# Patient Record
Sex: Female | Born: 1970
Health system: Southern US, Community
[De-identification: ages and names within clinical notes are randomized; demographics above are authoritative.]

## PROBLEM LIST (undated history)

## (undated) DIAGNOSIS — M1990 Unspecified osteoarthritis, unspecified site: Secondary | ICD-10-CM

## (undated) DIAGNOSIS — E119 Type 2 diabetes mellitus without complications: Secondary | ICD-10-CM

## (undated) DIAGNOSIS — I10 Essential (primary) hypertension: Secondary | ICD-10-CM

## (undated) DIAGNOSIS — E785 Hyperlipidemia, unspecified: Secondary | ICD-10-CM

## (undated) HISTORY — PX: TUBAL LIGATION: SHX77

## (undated) HISTORY — DX: Type 2 diabetes mellitus without complications: E11.9

## (undated) HISTORY — DX: Essential (primary) hypertension: I10

---

## 2001-07-02 ENCOUNTER — Emergency Department (HOSPITAL_COMMUNITY): Admission: EM | Admit: 2001-07-02 | Discharge: 2001-07-03 | Payer: Self-pay | Admitting: Emergency Medicine

## 2001-12-14 ENCOUNTER — Other Ambulatory Visit: Admission: RE | Admit: 2001-12-14 | Discharge: 2001-12-14 | Payer: Self-pay | Admitting: Obstetrics and Gynecology

## 2001-12-24 ENCOUNTER — Inpatient Hospital Stay (HOSPITAL_COMMUNITY): Admission: AD | Admit: 2001-12-24 | Discharge: 2001-12-27 | Payer: Self-pay | Admitting: Obstetrics & Gynecology

## 2002-05-15 ENCOUNTER — Ambulatory Visit (HOSPITAL_COMMUNITY): Admission: AD | Admit: 2002-05-15 | Discharge: 2002-05-15 | Payer: Self-pay | Admitting: Obstetrics and Gynecology

## 2002-05-29 ENCOUNTER — Ambulatory Visit (HOSPITAL_COMMUNITY): Admission: RE | Admit: 2002-05-29 | Discharge: 2002-05-29 | Payer: Self-pay | Admitting: Obstetrics and Gynecology

## 2002-06-03 ENCOUNTER — Ambulatory Visit (HOSPITAL_COMMUNITY): Admission: AD | Admit: 2002-06-03 | Discharge: 2002-06-03 | Payer: Self-pay | Admitting: Obstetrics and Gynecology

## 2002-06-10 ENCOUNTER — Ambulatory Visit (HOSPITAL_COMMUNITY): Admission: AD | Admit: 2002-06-10 | Discharge: 2002-06-10 | Payer: Self-pay | Admitting: Obstetrics and Gynecology

## 2002-06-18 ENCOUNTER — Ambulatory Visit (HOSPITAL_COMMUNITY): Admission: AD | Admit: 2002-06-18 | Discharge: 2002-06-18 | Payer: Self-pay | Admitting: Obstetrics and Gynecology

## 2002-06-24 ENCOUNTER — Inpatient Hospital Stay (HOSPITAL_COMMUNITY): Admission: AD | Admit: 2002-06-24 | Discharge: 2002-06-27 | Payer: Self-pay | Admitting: Obstetrics and Gynecology

## 2005-06-13 ENCOUNTER — Ambulatory Visit: Payer: Self-pay | Admitting: Family Medicine

## 2006-07-25 ENCOUNTER — Ambulatory Visit: Payer: Self-pay | Admitting: Family Medicine

## 2007-09-07 ENCOUNTER — Encounter: Payer: Self-pay | Admitting: Family Medicine

## 2007-09-07 ENCOUNTER — Ambulatory Visit: Payer: Self-pay | Admitting: Family Medicine

## 2007-09-10 ENCOUNTER — Other Ambulatory Visit: Admission: RE | Admit: 2007-09-10 | Discharge: 2007-09-10 | Payer: Self-pay | Admitting: Family Medicine

## 2007-09-11 ENCOUNTER — Encounter: Payer: Self-pay | Admitting: Family Medicine

## 2007-09-11 LAB — CONVERTED CEMR LAB
Chlamydia, DNA Probe: NEGATIVE
Gardnerella vaginalis: POSITIVE — AB
Microalb, Ur: 0.6 mg/dL (ref 0.00–1.89)
Trichomonal Vaginitis: NEGATIVE

## 2008-05-29 DIAGNOSIS — I10 Essential (primary) hypertension: Secondary | ICD-10-CM

## 2008-05-29 DIAGNOSIS — E1159 Type 2 diabetes mellitus with other circulatory complications: Secondary | ICD-10-CM

## 2008-05-29 DIAGNOSIS — E1169 Type 2 diabetes mellitus with other specified complication: Secondary | ICD-10-CM | POA: Insufficient documentation

## 2008-08-22 ENCOUNTER — Emergency Department (HOSPITAL_COMMUNITY): Admission: EM | Admit: 2008-08-22 | Discharge: 2008-08-22 | Payer: Self-pay | Admitting: Emergency Medicine

## 2008-08-28 ENCOUNTER — Ambulatory Visit: Payer: Self-pay | Admitting: Family Medicine

## 2008-08-28 LAB — CONVERTED CEMR LAB: Glucose, Bld: 266 mg/dL

## 2008-08-29 ENCOUNTER — Encounter: Payer: Self-pay | Admitting: Family Medicine

## 2008-08-29 LAB — CONVERTED CEMR LAB
ALT: 12 units/L (ref 0–35)
AST: 10 units/L (ref 0–37)
Albumin: 4.3 g/dL (ref 3.5–5.2)
Calcium: 9.1 mg/dL (ref 8.4–10.5)
Cholesterol: 206 mg/dL — ABNORMAL HIGH (ref 0–200)
Creatinine, Urine: 76.6 mg/dL
HDL: 59 mg/dL (ref >39)
Microalb, Ur: 0.38 mg/dL (ref 0.00–1.89)
Potassium: 4.2 meq/L (ref 3.5–5.3)
Sodium: 135 meq/L (ref 135–145)
Total CHOL/HDL Ratio: 3.5
Total Protein: 7.6 g/dL (ref 6.0–8.3)
VLDL: 29 mg/dL (ref 0–40)

## 2008-09-02 ENCOUNTER — Emergency Department (HOSPITAL_COMMUNITY): Admission: EM | Admit: 2008-09-02 | Discharge: 2008-09-02 | Payer: Self-pay | Admitting: Emergency Medicine

## 2008-09-03 ENCOUNTER — Telehealth: Payer: Self-pay | Admitting: Family Medicine

## 2008-09-04 ENCOUNTER — Encounter: Payer: Self-pay | Admitting: Family Medicine

## 2008-09-04 ENCOUNTER — Ambulatory Visit: Payer: Self-pay | Admitting: Family Medicine

## 2008-09-05 ENCOUNTER — Encounter: Payer: Self-pay | Admitting: Family Medicine

## 2008-09-06 DIAGNOSIS — E782 Mixed hyperlipidemia: Secondary | ICD-10-CM | POA: Insufficient documentation

## 2008-09-09 ENCOUNTER — Encounter: Payer: Self-pay | Admitting: Family Medicine

## 2008-09-24 ENCOUNTER — Ambulatory Visit: Payer: Self-pay | Admitting: Family Medicine

## 2008-09-24 ENCOUNTER — Other Ambulatory Visit: Admission: RE | Admit: 2008-09-24 | Discharge: 2008-09-24 | Payer: Self-pay | Admitting: Family Medicine

## 2008-09-24 ENCOUNTER — Encounter: Payer: Self-pay | Admitting: Family Medicine

## 2008-09-24 DIAGNOSIS — R5381 Other malaise: Secondary | ICD-10-CM | POA: Insufficient documentation

## 2008-09-24 DIAGNOSIS — R5383 Other fatigue: Secondary | ICD-10-CM

## 2009-03-27 ENCOUNTER — Ambulatory Visit: Payer: Self-pay | Admitting: Family Medicine

## 2009-03-27 DIAGNOSIS — R102 Pelvic and perineal pain: Secondary | ICD-10-CM

## 2009-04-03 ENCOUNTER — Ambulatory Visit (HOSPITAL_COMMUNITY): Admission: RE | Admit: 2009-04-03 | Discharge: 2009-04-03 | Payer: Self-pay | Admitting: Family Medicine

## 2009-05-08 ENCOUNTER — Ambulatory Visit: Payer: Self-pay | Admitting: Family Medicine

## 2009-05-08 LAB — CONVERTED CEMR LAB
Bilirubin Urine: NEGATIVE
Glucose, Urine, Semiquant: 100
Ketones, urine, test strip: NEGATIVE
Protein, U semiquant: NEGATIVE
pH: 6

## 2009-05-19 ENCOUNTER — Encounter: Payer: Self-pay | Admitting: Family Medicine

## 2009-05-21 ENCOUNTER — Ambulatory Visit (HOSPITAL_COMMUNITY): Admission: RE | Admit: 2009-05-21 | Discharge: 2009-05-21 | Payer: Self-pay | Admitting: Family Medicine

## 2009-05-21 ENCOUNTER — Ambulatory Visit: Payer: Self-pay | Admitting: Family Medicine

## 2009-05-21 DIAGNOSIS — J019 Acute sinusitis, unspecified: Secondary | ICD-10-CM

## 2009-05-21 DIAGNOSIS — J209 Acute bronchitis, unspecified: Secondary | ICD-10-CM

## 2009-05-21 LAB — CONVERTED CEMR LAB
Basophils Absolute: 0 10*3/uL (ref 0.0–0.1)
Glucose, Bld: 144 mg/dL
Hemoglobin: 12.8 g/dL (ref 12.0–15.0)
Lymphocytes Relative: 29 % (ref 12–46)
Lymphs Abs: 1.7 10*3/uL (ref 0.7–4.0)
Monocytes Absolute: 0.6 10*3/uL (ref 0.1–1.0)
Neutro Abs: 3.3 10*3/uL (ref 1.7–7.7)
RBC: 4.37 M/uL (ref 3.87–5.11)
RDW: 13 % (ref 11.5–15.5)
WBC: 5.8 10*3/uL (ref 4.0–10.5)

## 2009-05-25 ENCOUNTER — Ambulatory Visit: Payer: Self-pay | Admitting: Family Medicine

## 2009-05-26 ENCOUNTER — Telehealth: Payer: Self-pay | Admitting: Family Medicine

## 2009-05-26 ENCOUNTER — Encounter: Payer: Self-pay | Admitting: Family Medicine

## 2009-09-03 ENCOUNTER — Telehealth: Payer: Self-pay | Admitting: Family Medicine

## 2010-06-24 ENCOUNTER — Ambulatory Visit: Payer: Self-pay | Admitting: Family Medicine

## 2010-06-24 LAB — CONVERTED CEMR LAB
ALT: 13 units/L (ref 0–35)
AST: 13 units/L (ref 0–37)
Albumin: 3.9 g/dL (ref 3.5–5.2)
Alkaline Phosphatase: 61 units/L (ref 39–117)
Basophils Absolute: 0 10*3/uL (ref 0.0–0.1)
Basophils Relative: 0 % (ref 0–1)
CO2: 28 meq/L (ref 19–32)
Calcium: 9 mg/dL (ref 8.4–10.5)
Cholesterol: 210 mg/dL — ABNORMAL HIGH (ref 0–200)
Creatinine, Ser: 0.73 mg/dL (ref 0.40–1.20)
Eosinophils Absolute: 0.2 10*3/uL (ref 0.0–0.7)
Eosinophils Relative: 2 % (ref 0–5)
HCT: 38.2 % (ref 36.0–46.0)
HDL: 65 mg/dL (ref >39)
Hemoglobin: 12.5 g/dL (ref 12.0–15.0)
Lymphocytes Relative: 37 % (ref 12–46)
MCHC: 32.7 g/dL (ref 30.0–36.0)
Monocytes Absolute: 0.4 10*3/uL (ref 0.1–1.0)
Platelets: 255 10*3/uL (ref 150–400)
RDW: 13 % (ref 11.5–15.5)
Sodium: 136 meq/L (ref 135–145)
TSH: 0.958 microintl units/mL (ref 0.350–4.500)
Total CHOL/HDL Ratio: 3.2
Total Protein: 6.5 g/dL (ref 6.0–8.3)
Triglycerides: 83 mg/dL (ref <150)

## 2010-06-25 ENCOUNTER — Encounter: Payer: Self-pay | Admitting: Family Medicine

## 2010-06-25 LAB — CONVERTED CEMR LAB
Creatinine, Urine: 72.4 mg/dL
Microalb Creat Ratio: 6.9 mg/g
Microalb, Ur: 0.5 mg/dL

## 2010-06-29 ENCOUNTER — Telehealth: Payer: Self-pay | Admitting: Family Medicine

## 2010-07-05 ENCOUNTER — Telehealth: Payer: Self-pay | Admitting: Family Medicine

## 2010-07-29 ENCOUNTER — Ambulatory Visit: Payer: Self-pay | Admitting: Family Medicine

## 2010-11-25 NOTE — Assessment & Plan Note (Signed)
Summary: per dr diabetic   Vital Signs:  Patient profile:   40 year old female Menstrual status:  irregular Height:      64 inches Weight:      267.25 pounds BMI:     46.04 O2 Sat:      96 % Pulse rate:   78 / minute Pulse rhythm:   regular Resp:     16 per minute BP sitting:   160 / 100  (left arm) Cuff size:   xl   Vitals Entered By: Everitt Amber LPN (June 24, 2010 8:54 AM)  Nutrition Counseling: Patient's BMI is greater than 25 and therefore counseled on weight management options. CC: Follow up chronic problems, ran out of all her meds and had to have OV before she got any refills. Has been out for a few weeks but wants diabetes meds changed and also the Tekturna   CC:  Follow up chronic problems and ran out of all her meds and had to have OV before she got any refills. Has been out for a few weeks but wants diabetes meds changed and also the Tekturna.  History of Present Illness: Reports  that shwe ahs been doing fairly well, though she has stopped taking meds for approx 3 weeks. She does not want tabs for her blood sugar, only wants insulin. she has not been testing hersugars and thinks that it is high Denies recent fever or chills. Denies sinus pressure,  ear pain or sore throat. Denies chest congestion, or cough productive of sputum. Denies chest pain, palpitations, PND, orthopnea or leg swelling. Denies abdominal pain, nausea, vomitting, diarrhea or constipation. Denies change in bowel movements or bloody stool. Denies dysuria , frequency, incontinence or hesitancy. Denies  joint pain, swelling, or reduced mobility. Denies headaches, vertigo, seizures. Denies depression, anxiety or insomnia. Denies  rash, lesions, or itch.     Allergies (verified): No Known Drug Allergies  Past History:  Past Medical History: Current Problems:  OBESITY (ICD-278.00) DIABETES MELLITUS, TYPE II (ICD-250.00) HYPERTENSION (ICD-401.9) hyperlipidemia  Review of Systems   See HPI General:  Complains of fatigue and sleep disorder. Eyes:  Complains of blurring. ENT:  Complains of nasal congestion and sinus pressure; 1 month history, no fever, chills or green drainage, works in cold environment. Endo:  Complains of excessive thirst and excessive urination. Heme:  Denies abnormal bruising and bleeding. Allergy:  Denies hives or rash and itching eyes.  Physical Exam  General:  Well-developed,obese,in no acute distress; alert,appropriate and cooperative throughout examination HEENT: No facial asymmetry,  EOMI, No sinus tenderness, TM's Clear, oropharynx  pink and moist.   Chest: Clear to auscultation bilaterally.  CVS: S1, S2, No murmurs, No S3.   Abd: Soft, Nontender.  MS: Adequate ROM spine, hips, shoulders and knees.  Ext: No edema.   CNS: CN 2-12 intact, power tone and sensation normal throughout.   Skin: Intact, no visible lesions or rashes.  Psych: Good eye contact, normal affect.  Memory intact, not anxious or depressed appearing.   Diabetes Management Exam:    Foot Exam (with socks and/or shoes not present):       Sensory-Monofilament:          Left foot: diminished          Right foot: diminished       Inspection:          Left foot: normal          Right foot: normal  Nails:          Left foot: normal          Right foot: normal   Impression & Recommendations:  Problem # 1:  OBESITY (ICD-278.00) Assessment Unchanged  Ht: 64 (06/24/2010)   Wt: 267.25 (06/24/2010)   BMI: 46.04 (06/24/2010)  Problem # 2:  HYPERLIPIDEMIA (ICD-272.4) Assessment: Comment Only  Her updated medication list for this problem includes:    Lovastatin 20 Mg Tabs (Lovastatin) .Marland Kitchen... Take 1 tab by mouth at bedtime  Orders: T-Hepatic Function [id number]) T-Lipid Profile [phone])  Labs Reviewed: SGOT: 10 (08/28/2008)   SGPT: 12 (08/28/2008)   HDL:59 (08/28/2008)  LDL:118 (08/28/2008)  Chol:206 (08/28/2008)  Trig:143 (08/28/2008)  Problem # 3:   DIABETES MELLITUS, TYPE II (ICD-250.00) Assessment: Deteriorated  The following medications were removed from the medication list:    Glipizide Xl 10 Mg Xr24h-tab (Glipizide) ..... One tab by mouth bid    Janumet 50-1000 Mg Tabs (Sitagliptin-metformin hcl) .Marland Kitchen... Take 1 tablet by mouth two times a day Her updated medication list for this problem includes:    Lisinopril-hydrochlorothiazide 20-12.5 Mg Tabs (Lisinopril-hydrochlorothiazide) .Marland Kitchen..Marland Kitchen Two tabs by mouth qd    Humalog Mix 50/50 Kwikpen 50-50 % Susp (Insulin lispro prot & lispro) .Marland Kitchen... 25 units twice daily, start at 10 units twice daily and increase as directed  Orders: T- Hemoglobin A1C [id number]) T-Urine Microalbumin w/creat. ratio [id number])  Labs Reviewed: Creat: 0.80 (08/28/2008)    Reviewed HgBA1c results: 8.6 (03/27/2009)  9.6 (08/28/2008)  Problem # 4:  HYPERTENSION (ICD-401.9) Assessment: Unchanged  The following medications were removed from the medication list:    Tekturna 300 Mg Tabs (Aliskiren fumarate) .Marland Kitchen... Take 1 tablet by mouth once a day Her updated medication list for this problem includes:    Lisinopril-hydrochlorothiazide 20-12.5 Mg Tabs (Lisinopril-hydrochlorothiazide) .Marland Kitchen..Marland Kitchen Two tabs by mouth qd    Clonidine Hcl 0.3 Mg Tabs (Clonidine hcl) ..... One tab by mouth qhs    Amlodipine Besylate 10 Mg Tabs (Amlodipine besylate) .Marland Kitchen... Take 1 tablet by mouth once a day  BP today: 160/100 Prior BP: 160/100 (05/21/2009)  Labs Reviewed: K+: 4.2 (08/28/2008) Creat: : 0.80 (08/28/2008)   Chol: 206 (08/28/2008)   HDL: 59 (08/28/2008)   LDL: 118 (08/28/2008)   TG: 143 (08/28/2008)  Complete Medication List: 1)  Lisinopril-hydrochlorothiazide 20-12.5 Mg Tabs (Lisinopril-hydrochlorothiazide) .... Two tabs by mouth qd 2)  Lovastatin 20 Mg Tabs (Lovastatin) .... Take 1 tab by mouth at bedtime 3)  Clonidine Hcl 0.3 Mg Tabs (Clonidine hcl) .... One tab by mouth qhs 4)  Amlodipine Besylate 10 Mg Tabs (Amlodipine  besylate) .... Take 1 tablet by mouth once a day 5)  Humalog Mix 50/50 Kwikpen 50-50 % Susp (Insulin lispro prot & lispro) .... 25 units twice daily, start at 10 units twice daily and increase as directed 6)  Flonase 50 Mcg/act Susp (Fluticasone propionate) .... 2 puffs per nostril daily  Other Orders: T-Basic Metabolic Panel [id number]) T-CBC w/Diff [id number]) T-TSH [id number])  Patient Instructions: 1)  CPE in 5 weeks  2)  It is important that you exercise regularly at least 20 minutes 5 times a week. If you develop chest pain, have severe difficulty breathing, or feel very tired , stop exercising immediately and seek medical attention. 3)  You need to lose weight. Consider a lower calorie diet and regular exercise.  4)  BMP prior to visit, ICD-9: 5)  Hepatic Panel prior to visit, ICD-9: 6)  Lipid Panel prior to visit, ICD-9:  today 7)  TSH prior to visit, ICD-9: 8)  CBC w/ Diff prior to visit, ICD-9: 9)  HbgA1C prior to visit, ICD-9 10)  Urine Microalbumin prior to visit, ICD-9:  send today from ooffice 11)  Start 10 uniots twice daily of the insulin, call nex week for inc dose if the numbers are still high. 12)  Test 3 timres daily and record, bring meter and log book to visit. 13)  Before bkfast...90 to 130 14)  2 hours after any meal, goal is 130 to 180 15)  Bedtime goal is 130 to 180 16)  Meds are sent in as discussed Prescriptions: FLONASE 50 MCG/ACT SUSP (FLUTICASONE PROPIONATE) 2 puffs per nostril daily  #1 x 2   Entered and Authorized by:   Syliva Overman MD   Signed by:   Syliva Overman MD on 06/24/2010   Method used:   Electronically to        [facility] [address] [phone]* (retail)       [address] [place], [place]  [address]       Ph: [phone]       Fax: [phone]   RxID:   [id number] HUMALOG MIX 50/50 KWIKPEN 50-50 % SUSP (INSULIN LISPRO PROT & LISPRO) 25 units twice daily, start at 10 units twice daily and increase as directed  #1500  units x 3   Entered and Authorized by:   Syliva Overman MD   Signed by:   Syliva Overman MD on 06/24/2010   Method used:   Electronically to        [facility] [address] [phone]* (retail)       [address] [place], [place]  [address]       Ph: [phone]       Fax: [phone]   RxID:   [id number] AMLODIPINE BESYLATE 10 MG TABS (AMLODIPINE BESYLATE) Take 1 tablet by mouth once a day  #30 x 2   Entered and Authorized by:   Syliva Overman MD   Signed by:   Syliva Overman MD on 06/24/2010   Method used:   Electronically to        [facility] [address] [phone]* (retail)       [address] [place]  [address]       Ph: [phone]       Fax: [phone]   RxID:   [id number]

## 2010-11-25 NOTE — Progress Notes (Signed)
Summary: refill  Phone Note Call from Patient   Summary of Call: needs to get test stripes. [facility]. and also blood pressure pills [phone] Initial call taken by: Rudene Anda,  July 05, 2010 9:11 AM    Prescriptions: AMLODIPINE BESYLATE 10 MG TABS (AMLODIPINE BESYLATE) Take 1 tablet by mouth once a day  #30 x 0   Entered by:   Adella Hare LPN   Authorized by:   Syliva Overman MD   Signed by:   Adella Hare LPN on 78/29/5621   Method used:   Electronically to        [facility] [address] [phone]* (retail)       [address], [place]  [address]       Ph: [phone]       Fax: [phone]   RxID:   [id number]

## 2010-11-25 NOTE — Progress Notes (Signed)
Summary: sugar  Phone Note Call from Patient   Summary of Call: needs to speak with nurse about sugar. [phone] Initial call taken by: Rudene Anda,  June 29, 2010 8:39 AM  Follow-up for Phone Call        PATIENT STATES SHE CANNOT AFFORED THE [facility] INSULIN, CAN YOU SEND IN VIAL AND SYRINGES? [facility] Follow-up by: Adella Hare LPN,  June 29, 2010 8:52 AM  Additional Follow-up for Phone Call Additional follow up Details #1::        pls cfax script , I hacve printed it, you need to call in /send script for needles for twice daily admin , let know pls Additional Follow-up by: Syliva Overman MD,  June 29, 2010 12:33 PM    New/Updated Medications: HUMALOG MIX 50/50 50-50 % SUSP (INSULIN LISPRO PROT & LISPRO) 25 units twice daily BD INSULIN SYRINGE ULTRAFINE 29G X 1/2" 1 ML MISC (INSULIN SYRINGE-NEEDLE U-100) to use with insulin two times a day dx:250.00 Prescriptions: HUMALOG MIX 50/50 50-50 % SUSP (INSULIN LISPRO PROT & LISPRO) 25 units twice daily  #1500 units x 3   Entered by:   Adella Hare LPN   Authorized by:   Syliva Overman MD   Signed by:   Adella Hare LPN on 24/40/1027   Method used:   Electronically to        [facility] [address] [phone]* (retail)       [address], [place]  [address]       Ph: [phone]       Fax: [phone]   RxID:   [id number] BD INSULIN SYRINGE ULTRAFINE 29G X 1/2" 1 ML MISC (INSULIN SYRINGE-NEEDLE U-100) to use with insulin two times a day dx:250.00  #100 x 2   Entered by:   Adella Hare LPN   Authorized by:   Syliva Overman MD   Signed by:   Adella Hare LPN on 23/55/7322   Method used:   Electronically to        [facility] [address] [phone]* (retail)       [address] [place], [place]  [address]       Ph: [phone]       Fax: [phone]   RxID:   [id number] HUMALOG MIX 50/50 50-50 % SUSP (INSULIN LISPRO PROT & LISPRO) 25 units twice daily  #1500 units x 3   Entered and Authorized  by:   Syliva Overman MD   Signed by:   Syliva Overman MD on 06/29/2010   Method used:   Printed then faxed to ...       [facility] [address] [phone]* (retail)       [address], [place]  [address]       Ph: [phone]       Fax: [phone]   RxID:   [id number]

## 2010-11-25 NOTE — Assessment & Plan Note (Signed)
Summary: PHY Room 2    Vital Signs:  Patient profile:   40 year old female Menstrual status:  irregular Height:      64 inches Weight:      278 pounds BMI:     47.89 O2 Sat:      98 % Pulse rate:   86 / minute Resp:     16 per minute BP sitting:   170 / 92  (left arm)  Vitals Entered By: Everitt Amber LPN (July 29, 2010 8:43 AM) CC: Was here for CPE but wants to reschedule pap ue to period being on. Needs refill on  Comments needs refill on Lisinopril, Lovastatin and clonidine    CC:  Was here for CPE but wants to reschedule pap ue to period being on. Needs refill on .  History of Present Illness: Pt presents today for check up. Hx of htn. Not taking medications as prescribed. Has been out of clonidine and Lisinopril/HCT was uncertain if she should be taking this with starting the Amlodipine.  No headache, chest pain or palpitations.  Hx of DM.  Taking medications as prescribed and denies side effects. States he blood sugars have been significantly better in the last mos (before ran out of test strips). Has some morning hypoglycemia when awakens sugar in the 70's.  Depends on what time her nighttime break at work was and what she had to eat before bed.  Occ daytime hypoglycemia too, but much less than the early morning. Not checking blood sugar at home because ran out of test strips.   Last eye exam  1 yr ago. Pneumovax UTD.  Will receive flu vac today.  Hx of Hyperlipidemia. Not taking medication, had run out. Following a low fat diet. Not exercising regularly.       Current Medications (verified): 1)  Lisinopril-Hydrochlorothiazide 20-12.5 Mg Tabs (Lisinopril-Hydrochlorothiazide) .... Two Tabs By Mouth Qd 2)  Lovastatin 20 Mg Tabs (Lovastatin) .... Take 1 Tab By Mouth At Bedtime 3)  Clonidine Hcl 0.3 Mg Tabs (Clonidine Hcl) .... One Tab By Mouth Qhs 4)  Amlodipine Besylate 10 Mg Tabs (Amlodipine Besylate) .... Take 1 Tablet By Mouth Once A Day 5)  Flonase 50 Mcg/act  Susp (Fluticasone Propionate) .... 2 Puffs Per Nostril Daily 6)  Humalog Mix 50/50 50-50 % Susp (Insulin Lispro Prot & Lispro) .... 25 Units Twice Daily 7)  Bd Insulin Syringe Ultrafine 29g X 1/2" 1 Ml Misc (Insulin Syringe-Needle U-100) .... To Use With Insulin Two Times A Day Dx:250.00 8)  Accu-Chek Compact  Strp (Glucose Blood) .... Once Daily Testing  Allergies (verified): No Known Drug Allergies  Past History:  Past medical history reviewed for relevance to current acute and chronic problems.  Past Medical History: Reviewed history from 06/24/2010 and no changes required. Current Problems:  OBESITY (ICD-278.00) DIABETES MELLITUS, TYPE II (ICD-250.00) HYPERTENSION (ICD-401.9) hyperlipidemia  Review of Systems General:  Denies chills and fever. ENT:  Denies earache, nasal congestion, and sore throat. CV:  Denies chest pain or discomfort, lightheadness, and palpitations. Resp:  Denies cough and shortness of breath. GI:  Denies abdominal pain, nausea, and vomiting. Neuro:  Denies headaches.  Physical Exam  General:  Well-developed,well-nourished,in no acute distress; alert,appropriate and cooperative throughout examination Head:  Normocephalic and atraumatic without obvious abnormalities. No apparent alopecia or balding. Ears:  External ear exam shows no significant lesions or deformities.  Otoscopic examination reveals clear canals, tympanic membranes are intact bilaterally without bulging, retraction, inflammation or discharge. Hearing is  grossly normal bilaterally. Nose:  External nasal examination shows no deformity or inflammation. Nasal mucosa are pink and moist without lesions or exudates. Mouth:  Oral mucosa and oropharynx without lesions or exudates.  Neck:  No deformities, masses, or tenderness noted. Lungs:  Normal respiratory effort, chest expands symmetrically. Lungs are clear to auscultation, no crackles or wheezes. Heart:  Normal rate and regular rhythm. S1 and S2  normal without gallop, murmur, click, rub or other extra sounds. Pulses:  R posterior tibial normal, R dorsalis pedis normal, L posterior tibial normal, and L dorsalis pedis normal.   Extremities:  No clubbing, cyanosis, edema, or deformity noted with normal full range of motion of all joints.   Neurologic:  alert & oriented X3, strength normal in all extremities, sensation intact to light touch, gait normal, and DTRs symmetrical and normal.   Skin:  Intact without suspicious lesions or rashes Cervical Nodes:  No lymphadenopathy noted Psych:  Cognition and judgment appear intact. Alert and cooperative with normal attention span and concentration. No apparent delusions, illusions, hallucinations  Diabetes Management Exam:    Foot Exam (with socks and/or shoes not present):       Sensory-Monofilament:          Left foot: normal          Right foot: normal       Inspection:          Left foot: normal          Right foot: normal       Nails:          Left foot: normal          Right foot: normal   Impression & Recommendations:  Problem # 1:  DIABETES MELLITUS, TYPE II (ICD-250.00) Assessment Comment Only Discussed with pt the importance of not running out of meds.  And if she is uncertain if she is to continue a medication or stop to please call the office for clarification. Her updated medication list for this problem includes:    Lisinopril-hydrochlorothiazide 20-12.5 Mg Tabs (Lisinopril-hydrochlorothiazide) .Marland Kitchen..Marland Kitchen Two tabs by mouth qd    Humalog Mix 50/50 50-50 % Susp (Insulin lispro prot & lispro) .Marland Kitchen... 25 units twice daily  Labs Reviewed: Creat: 0.73 (06/24/2010)    Reviewed HgBA1c results: 11.2 (06/24/2010)  8.6 (03/27/2009)  Problem # 2:  HYPERTENSION (ICD-401.9) Assessment: Deteriorated Pt has been out of Lisinopril HCT and Clonidine.  Her updated medication list for this problem includes:    Lisinopril-hydrochlorothiazide 20-12.5 Mg Tabs (Lisinopril-hydrochlorothiazide)  .Marland Kitchen..Marland Kitchen Two tabs by mouth qd    Clonidine Hcl 0.3 Mg Tabs (Clonidine hcl) ..... One tab by mouth qhs    Amlodipine Besylate 10 Mg Tabs (Amlodipine besylate) .Marland Kitchen... Take 1 tablet by mouth once a day  Problem # 3:  HYPERLIPIDEMIA (ICD-272.4) Assessment: Comment Only Pt has been out of Lovastatin.  Her updated medication list for this problem includes:    Lovastatin 20 Mg Tabs (Lovastatin) .Marland Kitchen... Take 1 tab by mouth at bedtime  Problem # 4:  OBESITY (ICD-278.00) Assessment: Comment Only  Ht: 64 (07/29/2010)   Wt: 278 (07/29/2010)   BMI: 47.89 (07/29/2010)  Complete Medication List: 1)  Lisinopril-hydrochlorothiazide 20-12.5 Mg Tabs (Lisinopril-hydrochlorothiazide) .... Two tabs by mouth qd 2)  Lovastatin 20 Mg Tabs (Lovastatin) .... Take 1 tab by mouth at bedtime 3)  Clonidine Hcl 0.3 Mg Tabs (Clonidine hcl) .... One tab by mouth qhs 4)  Amlodipine Besylate 10 Mg Tabs (Amlodipine besylate) .... Take 1 tablet  by mouth once a day 5)  Flonase 50 Mcg/act Susp (Fluticasone propionate) .... 2 puffs per nostril daily 6)  Humalog Mix 50/50 50-50 % Susp (Insulin lispro prot & lispro) .... 25 units twice daily 7)  Bd Insulin Syringe Ultrafine 29g X 1/2" 1 Ml Misc (Insulin syringe-needle u-100) .... To use with insulin two times a day dx:250.00 8)  Accu-chek Compact Strp (Glucose blood) .... Once daily testing  Other Orders: Influenza Vaccine NON MCR ([address])  Patient Instructions: 1)  Please schedule a follow-up appointment in 1 month. 2)  Decrease your evening insulin from 25 units to 22 units.  Restart testing your blood sugar 3 times a day and use the values Dr Lodema Hong gave you at your last visit as your goal.  If you continue to have low blood sugar spells call the office. 3)  I have refilled all of your blood pressure meds, cholesterol medication and your test strips for you. 4)  Continue with the healthier diet!  You're doing a great job! 5)  It is important that you exercise regularly at least  20 minutes 5 times a week. If you develop chest pain, have severe difficulty breathing, or feel very tired , stop exercising immediately and seek medical attention. 6)  You need to lose weight. Consider a lower calorie diet and regular exercise.  Prescriptions: LOVASTATIN 20 MG TABS (LOVASTATIN) Take 1 tab by mouth at bedtime  #30 x 3   Entered and Authorized by:   Esperanza Sheets PA   Signed by:   Esperanza Sheets PA on 07/29/2010   Method used:   Electronically to        [facility] [phone]* (retail)       [address], [place]  [address]       Ph: [phone]       Fax: [phone]   RxID:   [id number] CLONIDINE HCL 0.3 MG TABS (CLONIDINE HCL) one tab by mouth qhs  #30 x 3   Entered and Authorized by:   Esperanza Sheets PA   Signed by:   Esperanza Sheets PA on 07/29/2010   Method used:   Electronically to        [facility] [address] [phone]* (retail)       [address] [place], [place]  [address]       Ph: [phone]       Fax: [phone]   RxID:   [id number] AMLODIPINE BESYLATE 10 MG TABS (AMLODIPINE BESYLATE) Take 1 tablet by mouth once a day  #30 x 3   Entered and Authorized by:   Esperanza Sheets PA   Signed by:   Esperanza Sheets PA on 07/29/2010   Method used:   Electronically to        [facility] [address] [phone]* (retail)       [address], [place]  [address]       Ph: [phone]       Fax: [phone]   RxID:   [id number] LISINOPRIL-HYDROCHLOROTHIAZIDE 20-12.5 MG TABS (LISINOPRIL-HYDROCHLOROTHIAZIDE) TWO TABS by mouth QD  #60 x 3   Entered and Authorized by:   Esperanza Sheets PA   Signed by:   Esperanza Sheets PA on 07/29/2010   Method used:   Electronically to        [facility] [phone]* (retail)  [address] [place], [place]  [address]       Ph: [phone]       Fax: [phone]   RxID:   [id number] ACCU-CHEK COMPACT  STRP (GLUCOSE BLOOD) once daily testing  #50month x 3   Entered by:   Everitt Amber  LPN   Authorized by:   Esperanza Sheets PA   Signed by:   Everitt Amber LPN on 84/13/2440   Method used:   Electronically to        [facility] [phone]* (retail)       [address] [place], [place]  [address]       Ph: [phone]       Fax: [phone]   RxID:   [id number]     Influenza Vaccine    Vaccine Type: Fluvax Non-MCR    Site: right deltoid    Mfr: novartis     Dose: 0.5 ml    Route: IM    Given by: Everitt Amber LPN    Exp. Date: 02/2011    Lot #: [id number]

## 2011-03-11 NOTE — H&P (Signed)
Glenda Thompson, Glenda Thompson                          ACCOUNT NO.:  [id number]   MEDICAL RECORD NO.:  [id number]                   PATIENT TYPE:   LOCATION:                                       FACILITY:   PHYSICIAN:  Tilda Burrow, M.D.              DATE OF BIRTH:   DATE OF ADMISSION:  DATE OF DISCHARGE:                                HISTORY & PHYSICAL   ADMISSION DIAGNOSES:  1. Pregnancy at 39 weeks' gestation.  2. Gestational diabetes, insulin-controlled.  3. Desire for elective permanent sterilization.  4. Small umbilical hernia.   HISTORY OF PRESENT ILLNESS:  This 40 year old female, gravida 3, para 1,  abortus 1, LMP 10/02/01, placing a menstrual EDC 07/09/02 with corresponding  first-trimester ultrasound and first, second, and third trimester  ultrasounds corresponding roughly, was admitted after a pregnancy course  followed through our office and notable for gestational diabetes.  The  patient was diagnosed early in pregnancy with an abnormal blood sugar at her  initial visit at nine weeks of 188 mg%, and hemoglobin A1C shortly  thereafter checked and elevated at 9.3.  She was placed on insulin therapy  and had dramatically improved blood sugar control.  Hemoglobin A1C dropped  to 6.4 and 5.9 in the third trimester, which are normal values.  The patient  has had a prior cesarean section so is scheduled for repeat cesarean  section.  She desires elective permanent sterilization.  Additionally, she  has a small reducible umbilical hernia which was easily identified early in  the pregnancy and is less visible now that she is advanced in her pregnancy.  Plans are to assess the umbilical hernia from beneath during the C-section  and place permanent sutures as necessary.  The patient denies permanent  sterilization, and the technical aspects of the procedure were reviewed,  with a 1-2% failure rate quoted.   PAST MEDICAL HISTORY:  Hypertension not active this pregnancy,  gestational  diabetes, status post low transverse cesarean section.   MEDICATIONS:  1. Prenatal vitamins from the health department.  2. Insulin, most recent regimen is 24 units regular, 14 units NPH in the     morning; 14 units NPH and 4 units regular in the evening, with hemoglobin     A1C mentioned last checked at 5.9.   PREGNANCY COURSE:  Nonstress tests bi-weekly have been normally reactive,  and blood sugar control has shown excellent fasting blood sugars, all less  than 105, with no blood sugar postprandial noted greater than 120.   PHYSICAL EXAMINATION:  VITAL SIGNS:  Height 5 feet 4 inches, weight 290,  which is a 23-pound weight gain, blood pressure 125/80.  Urinalysis  negative.  GENERAL:  She is a healthy-appearing, large-framed, moderately overweight  African-American female, alert and oriented x3.  HEENT:  Pupils equal, round, reactive.  NECK:  Supple.  CARDIOVASCULAR:  Unremarkable.  EXTREMITIES:  1+ edema pretibially.  ABDOMEN:  Fundal height is 39 cm.  The infant is suspected to be breech with  ultrasound confirmed as breech at 37 weeks.   PRENATAL LABORATORY DATA:  Blood type O positive.  Urine drug screen  negative.  Rubella immunity equivocal.  Hemoglobin 12, hematocrit 38.  Hepatitis, HIV, GC, RPR all negative.  MSAFP normal.  One-hour glucose  tolerance test not performed.  Hemoglobin A1C dramatically improved as  mentioned earlier.   PLAN:  Repeat C-section, tubal ligation, with possible umbilical hernia to  be performed on 07/01/02 at 7:30 a.m.                                               Tilda Burrow, M.D.    JVF/MEDQ  D:  06/19/2002  T:  06/19/2002  Job:  [id number]   cc:   Mila Homer. Sudie Bailey, M.D.  [address] [place], [place] [address]  Fax: [phone]   Francoise Schaumann. Halm, D.O.

## 2011-03-11 NOTE — Op Note (Signed)
NAME:  Glenda Thompson, Glenda Thompson                        ACCOUNT NO.:  [id number]   MEDICAL RECORD NO.:  [id number]                   PATIENT TYPE:  INP   LOCATION:  A428                                 FACILITY:  [facility]   PHYSICIAN:  Lazaro Arms, M.D.                DATE OF BIRTH:  [birth year]   DATE OF PROCEDURE:  06/24/2002  DATE OF DISCHARGE:                                 OPERATIVE REPORT   PREOPERATIVE DIAGNOSES:  1. Intrauterine pregnancy at 37 6/[redacted] weeks gestation.  2. Class B diabetes mellitus.  3. Labor.  4. Breach presentation.  5. Previous cesarean section.  6. Desires sterilization.   POSTOPERATIVE DIAGNOSES:  1. Intrauterine pregnancy at 37 6/[redacted] weeks gestation.  2. Class B diabetes mellitus.  3. Labor.  4. Breach presentation.  5. Previous cesarean section.  6. Desires sterilization.   PROCEDURE:  1. Repeat cesarean section, vertical uterine incision.  2. Modified Pomeroy bilateral tubal ligation.   SURGEON:  Lazaro Arms, M.D.   ANESTHESIA:  1. Failed spinal both by CRNA and myself.  2. General endotracheal anesthesia.   FINDINGS:  Over a low transverse hysterotomy incision was delivered a viable  female infant at 14:31 with Apgar of 7 and 9, the weight to be determined in  the nursery scale. The patient was in the frank breach presentation, there  was an anterior placenta. The lower uterine segment was densely adherent to  the bladder and anterior abdominal wall and as a result, I did not have any  room in the lower segment so I made a vertical uterine incision which I took  all the way up to the fundus. The placenta was anterior and had to be  delivered first and the baby was delivered after and was delivered in  assisted breach fashion with some difficulty. The infant was handed to Dr.  Latrelle Dodrill and Harley Hallmark, RN for routine neonatal resuscitation and I also took  part with positive pressure O2. The infant perked up very quickly with  positive pressure  O2.   The uterine, tubes and ovaries were otherwise normal.   DESCRIPTION OF PROCEDURE:  The patient was taken to the operating room,  placed in the sitting position where she underwent spinal attempts at the L3-  4 and L2-3 interspaces without success. She was then placed in the right  uterine tilt supine position, was prepped and draped in the usual sterile  fashion and then underwent general endotracheal anesthesia. A Pfannenstiel  skin incision was made and carried down sharply to the rectus fascia which  was scored in the midline and extended laterally. The fascia was taken off  the muscles superiorly and inferiorly without difficulty. The muscles were  divided and the lower segment was densely adherent to the abdominal wall and  the bladder flap really could not even be developed at all. As a result, I  made a vertical uterine  incision which I carried out to the fundus. The  placenta was anterior and was delivered unfortunately before the baby at  14:29. The infant was delivered in assisted breach fashion and it was a  frank breach presentation and delivered at 14:31. The infant was a female  and had Apgar of 7 and 9. The weight was still to be determined in the  nursery at the time of this dictation. Cord gas could not be obtained  despite my attempts because the placenta had been delivered and I also took  part in the resuscitation. Cord blood was sent. The uterus was exteriorized  and closed in three layers. Each successive layer being a running  interlocking layer. Hemostasis was additionally achieved with interrupted  figure-of-eight sutures. The uterus was hemostatic and firm. A modified  Pomeroy bilateral tubal ligation was performed bilaterally and approximately  a centimeter and a half segments of tube were sent to pathology. They were  both hemostatic. The uterus was once again found to be hemostatic and  replaced the peritoneal cavity. The muscle reapproximated loosely.  The  fascia was closed using #0 Vicryl running, the subcutaneous tissue was  irrigated and made hemostatic, the skin was closed using skin staples. The  patient tolerated the procedure well. She experienced 1000 cc of blood loss  and was taken to the recovery room in stable condition to undergo routine  postoperative care. She received Ancef prophylactically.                                               Lazaro Arms, M.D.    Loraine Maple  D:  06/24/2002  T:  06/24/2002  Job:  [id number]

## 2011-03-11 NOTE — Discharge Summary (Signed)
[facility]  Patient:    Glenda Thompson, Glenda Thompson Visit Number: [id number] MRN: [id number]          Service Type: GYN Location: 4A A426 01 Attending Physician:  Lazaro Arms Dictated by:   Duane Lope, M.D. Admit Date:  12/24/2001 Discharge Date: 12/27/2001                             Discharge Summary  DISCHARGE DIAGNOSES: 1. Intrauterine pregnancy at [redacted] weeks gestation. 2. Class B diabetes mellitus. 3. Significantly elevated hemoglobin A1c value.  PROCEDURES: 1. Admission December 24, 2001. 2. Daily care December 25, 2001. 3. Daily care December 26, 2001. 4. Discharge care December 27, 2001.  BRIEF HISTORY:  Please refer to the transcribed history and physical in [facility] chart for details of the admission to the hospital.  HOSPITAL COURSE:  The patient was admitted to undergo diabetic patterning. She has been diagnosed some time ago but had not followed up with Dr. Sudie Bailey for institution of insulin therapy. She had a hemoglobin A1c of 9.3 in the office and a repeat here in the hospital came back as 9.0 which she understands significantly increases her risk for congenital abnormalities and pregnancy loss.  The patient was begun on 20 of NPH and 10 of regular in the morning and 10 NPH and 10 of regular in the evening.  She had nutritional teaching. She had teaching regarding checking with her blood sugars with an Accu-Chek and drawing and administering her own insulin.  She did all this well the day prior to discharge.  Her blood sugars were acceptable, not as tight as I want them, but being an inpatient they tend to be a little tighter anyway so I am going to adjust as an outpatient.  She is discharged on 24 of NPH and 14 of regular in the morning and 14 of NPH and 14 of regular in the evening.  She will call me Monday morning to review her blood sugars so we can make insulin adjustments at that time.  If she has other problems in  the meantime she will contact the office. Dictated by:   Duane Lope, M.D. Attending Physician:  Lazaro Arms DD:  12/27/01 TD:  12/27/01 Job: [id number]

## 2011-03-11 NOTE — Discharge Summary (Signed)
   NAME:  Glenda Thompson, Glenda Thompson                        ACCOUNT NO.:  [id number]   MEDICAL RECORD NO.:  [id number]                   PATIENT TYPE:  INP   LOCATION:  A428                                 FACILITY:  [facility]   PHYSICIAN:  Lazaro Arms, M.D.                DATE OF BIRTH:  05/05/1971   DATE OF ADMISSION:  06/24/2002  DATE OF DISCHARGE:  06/27/2002                                 DISCHARGE SUMMARY   DISCHARGE DIAGNOSES:  1. Status post a repeat cesarean section with bilateral tubal ligation.  2. Breech presentation.  3. Previous cesarean section.  4. Class B diabetes mellitus.   PROCEDURE:  Repeat cesarean section with vertical uterine incision.   SURGEON:  Lazaro Arms, M.D.   HISTORY OF PRESENT ILLNESS:  Please refer to the transcribed history and  physical for details of admission to the hospital.   HOSPITAL COURSE:  The patient was supposed to undergo a cesarean section on  September 8, but came in in labor with a breech presentation.  As a result  we proceeded with a repeat cesarean section with tubal ligation.  Please see  the operative note for details.  Postoperatively the patient did well.  She  tolerated clear liquids and a regular diet.  Voided without symptoms.  Ambulated without symptoms.  Tolerated transition from IV to oral pain  medicine.  Her hemoglobin and hematocrit on postoperative day number one was  9.8 and 28.  On postoperative day number three it was 8.7 and 24.7 with  white count 11,500.  Her blood pressure was a little elevated and she had a  lot of swelling and as a result she was discharged to home on Maxzide 75/50,  Toradol q.8h. for five days, and Tylox for pain.  She will be seen in the  office next week to have her staples removed and Steri-Strips placed.  She  is given instructions and precautions for returning or contact to the office  prior to that time.                                               Lazaro Arms, M.D.    Loraine Maple   D:  06/27/2002  T:  06/28/2002  Job:  [id number]

## 2011-03-11 NOTE — H&P (Signed)
NAME:  Glenda Thompson, Glenda Thompson                        ACCOUNT NO.:  [id number]   MEDICAL RECORD NO.:  [id number]                   PATIENT TYPE:  INP   LOCATION:  A428                                 FACILITY:  [facility]   PHYSICIAN:  Lazaro Arms, M.D.                DATE OF BIRTH:  1971-09-10   DATE OF ADMISSION:  06/23/2002  DATE OF DISCHARGE:                                HISTORY & PHYSICAL   HISTORY OF PRESENT ILLNESS:  This patient is a 40 year old African-American  female, gravida 3, para 1, abortus 1 with an estimated date of delivery of  07/09/02 currently at 37-6/[redacted] weeks gestation by her last menstrual period  and confirmatory 9 week sonogram.  The patient is a class B diabetic.  When  she first came to see [place], during the pregnancy, she had an elevated  Hemoglobin A1c of 9.3.  We adjusted her insulin and very quickly got her  Hemoglobin A1c down to 6.4 and then down to 5.9 on 07/23.  Her control of  her glucose during the pregnancy has been impeccable.  She has undergone  twice weekly NFTs beginning at 32 weeks and they have been reassuring.  The  patient came in today complaining of uterine activity and she is having  contractions every 2-6 minutes.  She was given Brethine and IV fluids and  they have continued throughout the night.  In addition, she may have some  early leakage of fluid.  Additionally there has been episodes on the fetal  heart rate tracing that have been nonreassuring.  As a result she is  admitted for repeat C-section and tubal ligation that was to be done next  week, but with the contractions we will proceed with this week.   PAST MEDICAL HISTORY:  Hypertension and mild and controlled, and diabetes  class B.   PAST SURGICAL HISTORY:  C-section.   ALLERGIES:  None.   MEDICATIONS:  Prenatal vitamins, her regular and NPH insulin.   PAST OBSTETRICAL HISTORY:  She had a miscarriage in 2002 and she had a C-  section in 1993 for PIH and PROM 5 pounds 12 pounds.   Blood type is 0  positive.  Antibody screen is negative.  Serology is nonreactive.  Rubella  is equivocal.  Hepatitis B is negative.  HIV is negative.  RPR is  nonreactive.  GC and Chlamydia were negative.  Pap was normal. AFP was  normal.  She had a screening high level sonogram at [facility] at  approximately 18 weeks which was also normal.   PHYSICAL EXAMINATION:   VITAL SIGNS:  Blood pressure is 120/80.  Weight is 290 pounds.   HEENT:  Unremarkable.   NECK:  Thyroid is normal.   BREASTS:  Deferred.   LUNGS:  Clear.   HEART:  Showed a regular rhythm without murmur, regurgitation, or gallops.   ABDOMEN:  Last fundal height was 39  cm.   PELVIC:  Cervix is not assessed.   EXTREMITIES:  Warm, trace edema.   NEUROLOGIC:  Exam was grossly intact.   IMPRESSION:  1. Intrauterine pregnancy at 37-6/[redacted] weeks gestation.  2. Class B diabetes mellitus.  3. Labor.  4. Previous C-section.   PLAN:  The patient is admitted for a repeat cesarean section and tubal  ligation.  She understands the risks benefits, indications, alternatives and  will proceed.                                                Lazaro Arms, M.D.    Loraine Maple  D:  06/24/2002  T:  06/24/2002  Job:  [id number]

## 2011-03-11 NOTE — H&P (Signed)
[facility]  Patient:    Glenda Thompson, Glenda Thompson Visit Number: [id number] MRN: [id number]          Service Type: GYN Location: 4A A426 01 Attending Physician:  Lazaro Arms Dictated by:   Duane Lope, M.D. Admit Date:  12/24/2001 Discharge Date: 12/27/2001                           History and Physical  DATE OF BIRTH:  06-Jan-1971  HISTORY OF PRESENT ILLNESS:  Glenda Thompson is a 40 year old African-American female gravida 3, para 1, abortus 1 with an estimated date of delivery of July 09, 2002, by last menstrual period and confirmatory nine week sonogram, currently at 11 weeks and 6 days gestation.  The patient was found to have gestational diabetes with her last pregnancy and actually lost the baby at approximately four months gestation.  She was to have followed up with Dr. Sudie Bailey in meantime but has not to begin some sort of treatment for her diabetes but has not done so.  When she came to the office on December 07, 2001, for her first visit at 9-1/2 weeks, a hemoglobin A1c was subsequently drawn and came back at 9.3 which is quite significantly elevated.  In fact, there is some data on hemoglobin A1c rates her pregnancy loss and abnormalities associated with these levels, and she is at significant risk, probably in the range of 15-20% risk of pregnancy loss, cardiac anomalies and/or musculoskeletal anomalies.  This was all discussed extensively with her and the father of the baby and they understand.  She considered it for a few days and is being admitted to the hospital today for glucose control.  I wanted to admit her last week but she declined and said she would come in today.  As a result, she is admitted for initiation of insulin therapy.  PAST MEDICAL HISTORY:  Essentially hypertension and diabetes.  PAST SURGICAL HISTORY:  C-section.  PAST OBSTETRICAL HISTORY:  In 1993, she had a C-section according to her history at seven months for  PIH, and premature rupture of membranes, 5 pound 12 ounce infant, and in 2002 had a four month loss of pregnancy.  ALLERGIES:  None.  MEDICATIONS:  Prenatal vitamins.  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  FAMILY HISTORY:  Significant for hypertension and diabetes.  She does not smoke.  PHYSICAL EXAMINATION:  VITAL SIGNS:  Weight is 270 pounds, blood pressure 130/80.  HEENT:  Unremarkable with normal thyroid.  LUNGS: Clear.  HEART:  Regular rate and rhythm without murmurs, rubs or gallops.  BREASTS:  Exam is deferred.  ABDOMEN:  Benign.  A vaginal probe ultrasound was performed on December 19, 2001, and reveals a viable singleton infant consistent with 11-1/[redacted] weeks gestation with good cardiac activity and good motion.  There are no other abnormalities seen.  EXTREMITIES:  Extremities are warm with no edema.  NEUROLOGIC:  Grossly intact.  LABORATORY DATA:  A vaginal probe ultrasound was performed on December 19, 2001, and reveals a viable singleton infant consistent with 11-1/[redacted] weeks gestation with good cardiac activity and good motion.  There are no other abnormalities seen.  IMPRESSION: 1. Intrauterine pregnancy at 11 weeks and 6 days, estimated date of    confinement of July 09, 2002. 2. Viable pregnancy at this stage. 3. Untreated diabetes, class B diabetes mellitus.  PLAN:  The patient is admitted for diabetic teaching and patterning  and initiation of insulin therapy.  She understands the issues with regard to abnormalities in her hemoglobin A1c level.  That will be repeated today since it was done approximately 2-1/2 weeks ago.  She understands that she will be in the hospital for three to four days. Dictated by:   Duane Lope, M.D. Attending Physician:  Lazaro Arms DD:  12/24/01 TD:  12/24/01 Job: [id number]

## 2011-10-11 ENCOUNTER — Other Ambulatory Visit: Payer: Self-pay | Admitting: Family Medicine

## 2011-10-12 ENCOUNTER — Encounter: Payer: Self-pay | Admitting: Family Medicine

## 2011-10-12 ENCOUNTER — Ambulatory Visit (INDEPENDENT_AMBULATORY_CARE_PROVIDER_SITE_OTHER): Payer: 59 | Admitting: Family Medicine

## 2011-10-12 ENCOUNTER — Other Ambulatory Visit: Payer: Self-pay | Admitting: Family Medicine

## 2011-10-12 VITALS — BP 180/100 | HR 90 | Resp 16 | Ht 65.0 in | Wt 263.8 lb

## 2011-10-12 DIAGNOSIS — E785 Hyperlipidemia, unspecified: Secondary | ICD-10-CM

## 2011-10-12 DIAGNOSIS — I10 Essential (primary) hypertension: Secondary | ICD-10-CM

## 2011-10-12 DIAGNOSIS — E559 Vitamin D deficiency, unspecified: Secondary | ICD-10-CM

## 2011-10-12 DIAGNOSIS — E669 Obesity, unspecified: Secondary | ICD-10-CM

## 2011-10-12 DIAGNOSIS — Z139 Encounter for screening, unspecified: Secondary | ICD-10-CM

## 2011-10-12 DIAGNOSIS — Z23 Encounter for immunization: Secondary | ICD-10-CM

## 2011-10-12 DIAGNOSIS — E119 Type 2 diabetes mellitus without complications: Secondary | ICD-10-CM

## 2011-10-12 DIAGNOSIS — R0789 Other chest pain: Secondary | ICD-10-CM | POA: Insufficient documentation

## 2011-10-12 LAB — LIPID PANEL
Cholesterol: 204 mg/dL — ABNORMAL HIGH (ref 0–200)
HDL: 63 mg/dL (ref >39)

## 2011-10-12 LAB — COMPLETE METABOLIC PANEL WITH GFR
Albumin: 4 g/dL (ref 3.5–5.2)
Alkaline Phosphatase: 65 U/L (ref 39–117)
BUN: 15 mg/dL (ref 6–23)
CO2: 25 mEq/L (ref 19–32)
GFR, Est African American: >89 mL/min
GFR, Est Non African American: >89 mL/min
Glucose, Bld: 186 mg/dL — ABNORMAL HIGH (ref 70–99)
Sodium: 139 mEq/L (ref 135–145)
Total Bilirubin: 0.5 mg/dL (ref 0.3–1.2)
Total Protein: 7.1 g/dL (ref 6.0–8.3)

## 2011-10-12 LAB — CBC
HCT: 40.9 % (ref 36.0–46.0)
Hemoglobin: 13.2 g/dL (ref 12.0–15.0)
MCH: 27 pg (ref 26.0–34.0)
MCHC: 32.3 g/dL (ref 30.0–36.0)
RBC: 4.89 MIL/uL (ref 3.87–5.11)

## 2011-10-12 LAB — TSH: TSH: 1.079 u[IU]/mL (ref 0.350–4.500)

## 2011-10-12 MED ORDER — LISINOPRIL-HYDROCHLOROTHIAZIDE 20-12.5 MG PO TABS: 2.0000 | ORAL_TABLET | Freq: Every day | ORAL | Status: DC

## 2011-10-12 MED ORDER — CLONIDINE HCL 0.3 MG PO TABS: 0.3000 mg | ORAL_TABLET | Freq: Every day | ORAL | Status: DC

## 2011-10-12 MED ORDER — INSULIN LISPRO PROT & LISPRO (50-50 MIX) 100 UNIT/ML ~~LOC~~ SUSP: 25.0000 [IU] | Freq: Two times a day (BID) | SUBCUTANEOUS | Status: DC

## 2011-10-12 NOTE — Assessment & Plan Note (Signed)
hBA1C today, change in time of taking meds discussed, and importance of copmpliance stressed

## 2011-10-12 NOTE — Progress Notes (Signed)
Subjective:     Patient ID: Glenda Thompson, female   DOB: Jan 07, 1971, 40 y.o.   MRN: [id number]  HPI Pt in for f/u. Has been off of bP meds x 1 month. Reports difficulty taking insulin on the job, she is to change to 1am and midday. Blood sugars are not being checked regularly and she is non compliant with her diet No mammogram done, pap past due. No regular exercise  C/o pressure around sinuses x 2 days, cold air blowing down on her on the job, no fever chills or discolored drainage.Using oTC decongestant Chest discomfort 1 day ago relieved with robitussin  Review of Systems See HPI Denies recent fever or chills. Denies sinus pressure, nasal congestion, ear pain or sore throat. Denies chest congestion, productive cough or wheezing. Denies  palpitations and leg swelling Denies abdominal pain, nausea, vomiting,diarrhea or constipation.   Denies dysuria, frequency, hesitancy or incontinence. Denies joint pain, swelling and limitation in mobility. Denies , seizures, numbness, or tingling. Denies depression, anxiety or insomnia. Denies skin break down or rash.        Objective:   Physical Exam Patient alert and oriented and in no cardiopulmonary distress.  HEENT: No facial asymmetry, EOMI, no sinus tenderness,  oropharynx pink and moist.  Neck supple no adenopathy.  Chest: Clear to auscultation bilaterally.  CVS: S1, S2 no murmurs, no S3.  ABD: Soft non tender. Bowel sounds normal.  Ext: No edema  MS: Adequate ROM spine, shoulders, hips and knees.  Skin: Intact, no ulcerations or rash noted.  Psych: Good eye contact, normal affect. Memory intact not anxious or depressed appearing.  CNS: CN 2-12 intact, power, tone and sensation normal throughout.    Assessment:         Plan:

## 2011-10-12 NOTE — Assessment & Plan Note (Signed)
Uncontrolled due to non compliance, danger of same discussed

## 2011-10-12 NOTE — Patient Instructions (Addendum)
cPE in 3 month.  Your blood pressure is high, it is vital that you take medication for this every day as prescribed  I recommend you attend free group session at the hospital; to learn more about diabetes so your blood sugars improve. This is important to protect you from kidney damage , loss of vision , and higher risk of heart attack and stroke.  Labs today CBC, CMP and eGFR, lipid, HBA1C, TSH , vit D  Mammogram will be scheduled on your way out it is important you have this.  It is important that you exercise regularly at least 30 minutes 5 times a week. If you develop chest pain, have severe difficulty breathing, or feel very tired, stop exercising immediately and seek medical attention    A healthy diet is rich in fruit, vegetables and whole grains. Poultry fish, nuts and beans are a healthy choice for protein rather then red meat. A low sodium diet and drinking 64 ounces of water daily is generally recommended. Oils and sweet should be limited. Carbohydrates especially for those who are diabetic or overweight, should be limited to 30-45 gram per meal. It is important to eat on a regular schedule, at least 3 times daily. Snacks should be primarily fruits, vegetables or nuts.   TdaP today in office  PLEASE get the fl;u vaccine at the pharmacy

## 2011-10-12 NOTE — Assessment & Plan Note (Signed)
Htn, IDDM and hyperlipidemia will do office EKG

## 2011-10-13 DIAGNOSIS — E559 Vitamin D deficiency, unspecified: Secondary | ICD-10-CM | POA: Insufficient documentation

## 2011-10-13 LAB — MICROALBUMIN / CREATININE URINE RATIO
Creatinine, Urine: 162.5 mg/dL
Microalb, Ur: 2.34 mg/dL — ABNORMAL HIGH (ref 0.00–1.89)

## 2011-10-13 LAB — VITAMIN D 25 HYDROXY (VIT D DEFICIENCY, FRACTURES): Vit D, 25-Hydroxy: 11 ng/mL — ABNORMAL LOW (ref 30–89)

## 2011-10-13 LAB — HEMOGLOBIN A1C: Mean Plasma Glucose: 258 mg/dL — ABNORMAL HIGH (ref <117)

## 2011-10-13 MED ORDER — ERGOCALCIFEROL 1.25 MG (50000 UT) PO CAPS: 50000.0000 [IU] | ORAL_CAPSULE | ORAL | Status: AC

## 2011-10-13 NOTE — Assessment & Plan Note (Signed)
Unchanged. Patient re-educated about  the importance of commitment to a  minimum of 150 minutes of exercise per week. The importance of healthy food choices with portion control discussed. Encouraged to start a food diary, count calories and to consider  joining a support group. Sample diet sheets offered. Goals set by the patient for the next several months.    

## 2011-10-13 NOTE — Assessment & Plan Note (Signed)
Multiple CV risk factors , should be on a statin based on  IDDM, already c/o chest pain, however states she is unable to afford current meds , will start no new ones at this time

## 2011-10-24 ENCOUNTER — Ambulatory Visit (HOSPITAL_COMMUNITY)
Admission: RE | Admit: 2011-10-24 | Discharge: 2011-10-24 | Disposition: A | Discharge status: Home / Self Care | Payer: 59 | Source: Ambulatory Visit | Attending: Family Medicine | Admitting: Family Medicine

## 2011-10-24 DIAGNOSIS — Z1231 Encounter for screening mammogram for malignant neoplasm of breast: Secondary | ICD-10-CM | POA: Insufficient documentation

## 2011-10-24 DIAGNOSIS — Z139 Encounter for screening, unspecified: Secondary | ICD-10-CM

## 2011-10-28 ENCOUNTER — Encounter: Payer: Self-pay | Admitting: Family Medicine

## 2011-12-06 ENCOUNTER — Other Ambulatory Visit: Payer: Self-pay | Admitting: Family Medicine

## 2012-01-02 ENCOUNTER — Other Ambulatory Visit (HOSPITAL_COMMUNITY)
Admission: RE | Admit: 2012-01-02 | Discharge: 2012-01-02 | Disposition: A | Discharge status: Home / Self Care | Payer: 59 | Source: Ambulatory Visit | Attending: Family Medicine | Admitting: Family Medicine

## 2012-01-02 ENCOUNTER — Encounter: Payer: Self-pay | Admitting: Family Medicine

## 2012-01-02 ENCOUNTER — Ambulatory Visit (INDEPENDENT_AMBULATORY_CARE_PROVIDER_SITE_OTHER): Payer: 59 | Admitting: Family Medicine

## 2012-01-02 VITALS — BP 160/100 | HR 84 | Resp 16 | Ht 65.0 in | Wt 267.1 lb

## 2012-01-02 DIAGNOSIS — Z0000 Encounter for general adult medical examination without abnormal findings: Secondary | ICD-10-CM

## 2012-01-02 DIAGNOSIS — E669 Obesity, unspecified: Secondary | ICD-10-CM

## 2012-01-02 DIAGNOSIS — Z1211 Encounter for screening for malignant neoplasm of colon: Secondary | ICD-10-CM

## 2012-01-02 DIAGNOSIS — E785 Hyperlipidemia, unspecified: Secondary | ICD-10-CM

## 2012-01-02 DIAGNOSIS — E119 Type 2 diabetes mellitus without complications: Secondary | ICD-10-CM

## 2012-01-02 DIAGNOSIS — Z124 Encounter for screening for malignant neoplasm of cervix: Secondary | ICD-10-CM

## 2012-01-02 DIAGNOSIS — I10 Essential (primary) hypertension: Secondary | ICD-10-CM

## 2012-01-02 DIAGNOSIS — Z01419 Encounter for gynecological examination (general) (routine) without abnormal findings: Secondary | ICD-10-CM | POA: Insufficient documentation

## 2012-01-02 NOTE — Assessment & Plan Note (Signed)
Uncontrolled, increase clonidine to one and a half tablets once daily

## 2012-01-02 NOTE — Patient Instructions (Addendum)
F/u in 2 month.Blood pressure is high today  Increased dose of clonidine in ONE and a half tablets once daily.  HBA1C and chem 7 on 3/18 or after PLEASE  It is important that you exercise regularly at least 30 minutes 5 times a week. If you develop chest pain, have severe difficulty breathing, or feel very tired, stop exercising immediately and seek medical attention   A healthy diet is rich in fruit, vegetables and whole grains. Poultry fish, nuts and beans are a healthy choice for protein rather then red meat. A low sodium diet and drinking 64 ounces of water daily is generally recommended. Oils and sweet should be limited. Carbohydrates especially for those who are diabetic or overweight, should be limited to 34-45 gram per meal. It is important to eat on a regular schedule, at least 3 times daily. Snacks should be primarily fruits, vegetables or nuts.   Goal for fasting blood sugar ranges from 80 to 120 and 2 hours after any meal or at bedtime should be between 130 to 170.

## 2012-01-08 NOTE — Assessment & Plan Note (Signed)
Unchanged. Patient re-educated about  the importance of commitment to a  minimum of 150 minutes of exercise per week. The importance of healthy food choices with portion control discussed. Encouraged to start a food diary, count calories and to consider  joining a support group. Sample diet sheets offered. Goals set by the patient for the next several months.    

## 2012-01-08 NOTE — Assessment & Plan Note (Signed)
Uncontrolled, pt to be more diligent with diet and medication

## 2012-01-08 NOTE — Progress Notes (Signed)
  Subjective:    Patient ID: Glenda Thompson, female    DOB: Sep 23, 1971, 41 y.o.   MRN: [id number]  HPI The PT is here for annual exam  and re-evaluation of chronic medical conditions, medication management and review of any available recent lab and radiology data.  Preventive health is updated, specifically  Cancer screening and Immunization.   Questions or concerns regarding consultations or procedures which the PT has had in the interim are  addressed. The PT denies any adverse reactions to current medications since the last visit.  There are no new concerns.  There are no specific complaints . Blood sugars remain elevated      Review of Systems See HPI Denies recent fever or chills. Denies sinus pressure, nasal congestion, ear pain or sore throat. Denies chest congestion, productive cough or wheezing. Denies chest pains, palpitations and leg swelling Denies abdominal pain, nausea, vomiting,diarrhea or constipation.   Denies dysuria, frequency, hesitancy or incontinence. Denies joint pain, swelling and limitation in mobility. Denies headaches, seizures, numbness, or tingling. Denies depression, anxiety or insomnia. Denies skin break down or rash.        Objective:   Physical Exam  Pleasant well nourished female, alert and oriented x 3, in no cardio-pulmonary distress. Afebrile. HEENT No facial trauma or asymetry. Sinuses non tender.  EOMI, PERTL, fundoscopic exam is normal, no hemorhage or exudate.  External ears normal, tympanic membranes clear. Oropharynx moist, no exudate, fair  dentition. Neck: supple, no adenopathy,JVD or thyromegaly.No bruits.  Chest: Clear to ascultation bilaterally.No crackles or wheezes. Non tender to palpation  Breast: No asymetry,no masses. No nipple discharge or inversion. No axillary or supraclavicular adenopathy  Cardiovascular system; Heart sounds normal,  S1 and  S2 ,no S3.  No murmur, or thrill. Apical beat not  displaced Peripheral pulses normal.  Abdomen: Soft, non tender, no organomegaly or masses. No bruits. Bowel sounds normal. No guarding, tenderness or rebound.  Rectal:  No mass. Guaiac negative stool.  GU: External genitalia normal. No lesions. Vaginal canal normal.No discharge. Uterus normal size, no adnexal masses, no cervical motion or adnexal tenderness.  Musculoskeletal exam: Full ROM of spine, hips , shoulders and knees. No deformity ,swelling or crepitus noted. No muscle wasting or atrophy.   Neurologic: Cranial nerves 2 to 12 intact. Power, tone ,sensation and reflexes normal throughout. No disturbance in gait. No tremor.  Skin: Intact, no ulceration, erythema , scaling or rash noted. Pigmentation normal throughout  Psych; Normal mood and affect. Judgement and concentration normal Diabetic Foot Check:  Appearance - no lesions, ulcers or calluses Skin - no unusual pallor or redness Sensation - grossly intact to light touch Monofilament testing -  Right - Great toe, medial, central, lateral ball and posterior foot diminished Left - Great toe, medial, central, lateral ball and posterior foot diminished Pulses Left - Dorsalis Pedis and Posterior Tibia normal Right - Dorsalis Pedis and Posterior Tibia normal       Assessment & Plan:

## 2012-01-08 NOTE — Assessment & Plan Note (Signed)
Uncontrolled low fat diet discussed

## 2012-03-06 ENCOUNTER — Encounter: Payer: Self-pay | Admitting: Family Medicine

## 2012-03-06 ENCOUNTER — Ambulatory Visit: Payer: 59 | Admitting: Family Medicine

## 2012-11-05 ENCOUNTER — Other Ambulatory Visit: Payer: Self-pay | Admitting: Family Medicine

## 2012-12-11 ENCOUNTER — Ambulatory Visit (INDEPENDENT_AMBULATORY_CARE_PROVIDER_SITE_OTHER): Payer: 59 | Admitting: Family Medicine

## 2012-12-11 ENCOUNTER — Encounter: Payer: Self-pay | Admitting: Family Medicine

## 2012-12-11 VITALS — BP 180/92 | HR 86 | Resp 18 | Wt 272.1 lb

## 2012-12-11 DIAGNOSIS — J111 Influenza due to unidentified influenza virus with other respiratory manifestations: Secondary | ICD-10-CM | POA: Insufficient documentation

## 2012-12-11 DIAGNOSIS — E669 Obesity, unspecified: Secondary | ICD-10-CM

## 2012-12-11 DIAGNOSIS — Z1329 Encounter for screening for other suspected endocrine disorder: Secondary | ICD-10-CM

## 2012-12-11 DIAGNOSIS — E785 Hyperlipidemia, unspecified: Secondary | ICD-10-CM

## 2012-12-11 DIAGNOSIS — J019 Acute sinusitis, unspecified: Secondary | ICD-10-CM

## 2012-12-11 DIAGNOSIS — J0120 Acute ethmoidal sinusitis, unspecified: Secondary | ICD-10-CM | POA: Insufficient documentation

## 2012-12-11 DIAGNOSIS — I10 Essential (primary) hypertension: Secondary | ICD-10-CM

## 2012-12-11 DIAGNOSIS — J309 Allergic rhinitis, unspecified: Secondary | ICD-10-CM | POA: Insufficient documentation

## 2012-12-11 DIAGNOSIS — E119 Type 2 diabetes mellitus without complications: Secondary | ICD-10-CM

## 2012-12-11 LAB — COMPLETE METABOLIC PANEL WITH GFR
ALT: 13 U/L (ref 0–35)
Albumin: 3.9 g/dL (ref 3.5–5.2)
CO2: 24 mEq/L (ref 19–32)
Calcium: 9.1 mg/dL (ref 8.4–10.5)
Chloride: 102 mEq/L (ref 96–112)
Creat: 0.85 mg/dL (ref 0.50–1.10)
GFR, Est African American: >89 mL/min

## 2012-12-11 LAB — TSH: TSH: 1.229 u[IU]/mL (ref 0.350–4.500)

## 2012-12-11 LAB — LIPID PANEL
LDL Cholesterol: 113 mg/dL — ABNORMAL HIGH (ref 0–99)
Triglycerides: 107 mg/dL (ref <150)

## 2012-12-11 LAB — HEMOGLOBIN A1C
Hgb A1c MFr Bld: 11.7 % — ABNORMAL HIGH (ref <5.7)
Mean Plasma Glucose: 289 mg/dL — ABNORMAL HIGH (ref <117)

## 2012-12-11 LAB — CBC
HCT: 36.9 % (ref 36.0–46.0)
Platelets: 251 10*3/uL (ref 150–400)
RDW: 13.6 % (ref 11.5–15.5)
WBC: 8.6 10*3/uL (ref 4.0–10.5)

## 2012-12-11 MED ORDER — OSELTAMIVIR PHOSPHATE 75 MG PO CAPS: 75.0000 mg | ORAL_CAPSULE | Freq: Two times a day (BID) | ORAL | Status: DC

## 2012-12-11 MED ORDER — AZITHROMYCIN 250 MG PO TABS: ORAL_TABLET | ORAL | Status: AC

## 2012-12-11 MED ORDER — LORATADINE 10 MG PO TABS: 10.0000 mg | ORAL_TABLET | Freq: Every day | ORAL | Status: DC

## 2012-12-11 MED ORDER — CLONIDINE HCL 0.3 MG PO TABS: ORAL_TABLET | ORAL | Status: DC

## 2012-12-11 MED ORDER — FLUCONAZOLE 150 MG PO TABS: ORAL_TABLET | ORAL | Status: AC

## 2012-12-11 MED ORDER — LISINOPRIL-HYDROCHLOROTHIAZIDE 20-12.5 MG PO TABS: 2.0000 | ORAL_TABLET | Freq: Every day | ORAL | Status: DC

## 2012-12-11 NOTE — Progress Notes (Signed)
  Subjective:    Patient ID: Glenda Thompson, female    DOB: [birth year], 42 y.o.   MRN: [id number]  HPI 3 day h/o generalized chills, body aches, sinus pressure with thick drainage has not had flu vaccine. C/o uncontrolled blood sugar, polyuria and blurred vision, has not been taking meds as prescribed, challenged financially, has not kept up with appointments despite always being uncontrolled when sh e does come. States she is changing this now. Has been out of bP meds, they were denied as she has not been here for over 6 mnth   Review of Systems See HPI . Denies chest congestion, productive cough or wheezing. Denies chest pains, palpitations and leg swelling Denies abdominal pain, nausea, vomiting,diarrhea or constipation.   Denies dysuria, hesitancy or incontinence. Denies joint pain, swelling and limitation in mobility. Denies headaches, seizures, numbness, or tingling. Denies depression, anxiety or insomnia. Denies skin break down or rash.        Objective:   Physical Exam Patient alert and oriented and in no cardiopulmonary distress.  HEENT: No facial asymmetry, EOMI,fethmoid   sinus tenderness,  oropharynx pink and moist.  Neck supple bilateral anterior cervical  Adenopathy.TM clear bilaterally  Chest: Clear to auscultation bilaterally.  CVS: S1, S2 no murmurs, no S3.  ABD: Soft non tender. Bowel sounds normal.  Ext: No edema  MS: Adequate ROM spine, shoulders, hips and knees.  Skin: Intact, no ulcerations or rash noted.  Psych: Good eye contact, normal affect. Memory intact not anxious or depressed appearing.  CNS: CN 2-12 intact, power, tone and sensation normal throughout.        Assessment & Plan:

## 2012-12-11 NOTE — Patient Instructions (Addendum)
F/ui in 4 weeks with blood sugar log  Need to test and record at least twice daily, may be up to 3 times daily.  Goal for fasting blood sugar ranges from 80 to 120 and 2 hours after any meal or at bedtime should be between 130 to 170.  Lipid, cmp and EGFR HBA1C microalbuminuria, CBc and thyroid today  Blood pressure medication sent in, the rest will be sent tomorrow after labs are in .  PLEASE contact office in the afternoon if you do not get a call  Treatment for sinusitis and inflenza are sent in   Claritin sent in for allergies

## 2012-12-12 ENCOUNTER — Telehealth: Payer: Self-pay | Admitting: Family Medicine

## 2012-12-12 ENCOUNTER — Other Ambulatory Visit: Payer: Self-pay | Admitting: Family Medicine

## 2012-12-12 LAB — MICROALBUMIN / CREATININE URINE RATIO
Creatinine, Urine: 54.5 mg/dL
Microalb Creat Ratio: 9.2 mg/g (ref 0.0–30.0)
Microalb, Ur: 0.5 mg/dL (ref 0.00–1.89)

## 2012-12-12 NOTE — Telephone Encounter (Signed)
Wanted to know if you were going to change her insulin based on her labwork and she needs rx for accu-chek compact for twice or TID daily testing. Whichever you decide

## 2012-12-12 NOTE — Telephone Encounter (Signed)
Called patient and left message for them to return call at the office   

## 2012-12-12 NOTE — Telephone Encounter (Signed)
pls let her know blood sugar is very high I recommend she see specialist, Dr Fransico Him, pls fax and get asap appt please, let referral know, just refill the med she was on before, pls giver her the numbers

## 2012-12-16 NOTE — Assessment & Plan Note (Signed)
acutee infection z pack prescribed

## 2012-12-16 NOTE — Assessment & Plan Note (Signed)
Pt to take allergy meds daily

## 2012-12-16 NOTE — Assessment & Plan Note (Signed)
Deteriorated. Patient re-educated about  the importance of commitment to a  minimum of 150 minutes of exercise per week. The importance of healthy food choices with portion control discussed. Encouraged to start a food diary, count calories and to consider  joining a support group. Sample diet sheets offered. Goals set by the patient for the next several months.    

## 2012-12-16 NOTE — Assessment & Plan Note (Signed)
Uncontrolled due to med non compliance, importance of same stressed. DASH diet and commitment to daily physical activity for a minimum of 30 minutes discussed and encouraged, as a part of hypertension management. The importance of attaining a healthy weight is also discussed.  

## 2012-12-16 NOTE — Assessment & Plan Note (Signed)
z pack prescibed

## 2012-12-16 NOTE — Assessment & Plan Note (Signed)
Acute influenza, anti viral therapy and decongestant prescribed

## 2012-12-16 NOTE — Assessment & Plan Note (Signed)
Uncontrolled and woesening, needs to be treated by endo, will refer

## 2012-12-16 NOTE — Assessment & Plan Note (Signed)
Elevated LDL, pt has multiple uncontrolled risk factors for cvd, needs to startstatin pravstatin prescribed, she needs to be informed

## 2012-12-17 ENCOUNTER — Other Ambulatory Visit: Payer: Self-pay

## 2012-12-17 DIAGNOSIS — E785 Hyperlipidemia, unspecified: Secondary | ICD-10-CM

## 2012-12-17 MED ORDER — GLUCOSE BLOOD VI STRP: ORAL_STRIP | Status: DC

## 2012-12-17 MED ORDER — PRAVASTATIN SODIUM 20 MG PO TABS: 20.0000 mg | ORAL_TABLET | Freq: Every evening | ORAL | Status: DC

## 2012-12-17 MED ORDER — INSULIN LISPRO PROT & LISPRO (50-50 MIX) 100 UNIT/ML ~~LOC~~ SUSP: 25.0000 [IU] | Freq: Two times a day (BID) | SUBCUTANEOUS | Status: DC

## 2012-12-17 NOTE — Telephone Encounter (Signed)
Pt aware.

## 2012-12-17 NOTE — Telephone Encounter (Signed)
Patient aware.

## 2012-12-18 ENCOUNTER — Other Ambulatory Visit: Payer: Self-pay | Admitting: Family Medicine

## 2013-01-15 ENCOUNTER — Encounter: Payer: Self-pay | Admitting: Family Medicine

## 2013-01-15 ENCOUNTER — Ambulatory Visit (INDEPENDENT_AMBULATORY_CARE_PROVIDER_SITE_OTHER): Payer: 59 | Admitting: Family Medicine

## 2013-01-15 VITALS — BP 140/82 | HR 61 | Resp 16 | Ht 65.0 in | Wt 260.1 lb

## 2013-01-15 DIAGNOSIS — E559 Vitamin D deficiency, unspecified: Secondary | ICD-10-CM

## 2013-01-15 DIAGNOSIS — E669 Obesity, unspecified: Secondary | ICD-10-CM

## 2013-01-15 DIAGNOSIS — I10 Essential (primary) hypertension: Secondary | ICD-10-CM

## 2013-01-15 DIAGNOSIS — IMO0001 Reserved for inherently not codable concepts without codable children: Secondary | ICD-10-CM

## 2013-01-15 DIAGNOSIS — E1065 Type 1 diabetes mellitus with hyperglycemia: Secondary | ICD-10-CM

## 2013-01-15 DIAGNOSIS — E785 Hyperlipidemia, unspecified: Secondary | ICD-10-CM

## 2013-01-15 NOTE — Assessment & Plan Note (Signed)
Controlled, no change in medication DASH diet and commitment to daily physical activity for a minimum of 30 minutes discussed and encouraged, as a part of hypertension management. The importance of attaining a healthy weight is also discussed.  

## 2013-01-15 NOTE — Assessment & Plan Note (Signed)
Improved. Pt applauded on succesful weight loss through lifestyle change, and encouraged to continue same. Weight loss goal set for the next several months.  

## 2013-01-15 NOTE — Patient Instructions (Addendum)
CPE in July, mid  Blood pressure improved, no med change.  It is important that you exercise regularly at least 30 minutes 5 times a week. If you develop chest pain, have severe difficulty breathing, or feel very tired, stop exercising immediately and seek medical attention   Congrats on improved blood sugars and weight loss  Fasting lipid, hepatic and vit D in July before visit, pls let Dr Fransico Him know these labs ordered  You are referred for eye exam  Please schedule your mammogram , this is past due

## 2013-01-15 NOTE — Progress Notes (Signed)
  Subjective:    Patient ID: Glenda Thompson, female    DOB: [birth year], 42 y.o.   MRN: [id number]  HPI The PT is here for follow up and re-evaluation of chronic medical conditions, medication management and review of any available recent lab and radiology data.  Preventive health is updated, specifically  Cancer screening and Immunization.   Questions or concerns regarding consultations or procedures which the PT has had in the interim are  Addressed.Has started seeing endo with marked improvement in blood sugars and 12 pound weight loss with a very simple med regime The PT denies any adverse reactions to current medications since the last visit.  There are no new concerns.  There are no specific complaints       Review of Systems See HPI Denies recent fever or chills. Denies sinus pressure, nasal congestion, ear pain or sore throat. Denies chest congestion, productive cough or wheezing. Denies chest pains, palpitations and leg swelling Denies abdominal pain, nausea, vomiting,diarrhea or constipation.   Denies dysuria, frequency, hesitancy or incontinence. Denies joint pain, swelling and limitation in mobility. Denies headaches, seizures, numbness, or tingling. Denies depression, anxiety or insomnia. Denies skin break down or rash.        Objective:   Physical Exam  Patient alert and oriented and in no cardiopulmonary distress.  HEENT: No facial asymmetry, EOMI, no sinus tenderness,  oropharynx pink and moist.  Neck supple no adenopathy.  Chest: Clear to auscultation bilaterally.  CVS: S1, S2 no murmurs, no S3.  ABD: Soft non tender. Bowel sounds normal.  Ext: No edema  MS: Adequate ROM spine, shoulders, hips and knees.  Skin: Intact, no ulcerations or rash noted.  Psych: Good eye contact, normal affect. Memory intact not anxious or depressed appearing.  CNS: CN 2-12 intact, power, tone and sensation normal throughout.       Assessment & Plan:

## 2013-01-15 NOTE — Assessment & Plan Note (Signed)
Hyperlipidemia:Low fat diet discussed and encouraged.  Updated lab in 4 month 

## 2013-01-15 NOTE — Assessment & Plan Note (Signed)
Marked improvement by history, now being treated by endo

## 2013-05-15 ENCOUNTER — Encounter: Payer: Self-pay | Admitting: Family Medicine

## 2013-05-15 ENCOUNTER — Other Ambulatory Visit (HOSPITAL_COMMUNITY)
Admission: RE | Admit: 2013-05-15 | Discharge: 2013-05-15 | Disposition: A | Discharge status: Home / Self Care | Payer: 59 | Source: Ambulatory Visit | Attending: Family Medicine | Admitting: Family Medicine

## 2013-05-15 ENCOUNTER — Ambulatory Visit (INDEPENDENT_AMBULATORY_CARE_PROVIDER_SITE_OTHER): Payer: 59 | Admitting: Family Medicine

## 2013-05-15 VITALS — BP 154/80 | HR 63 | Resp 16 | Ht 65.0 in | Wt 244.0 lb

## 2013-05-15 DIAGNOSIS — E1065 Type 1 diabetes mellitus with hyperglycemia: Secondary | ICD-10-CM

## 2013-05-15 DIAGNOSIS — Z1151 Encounter for screening for human papillomavirus (HPV): Secondary | ICD-10-CM | POA: Insufficient documentation

## 2013-05-15 DIAGNOSIS — Z1211 Encounter for screening for malignant neoplasm of colon: Secondary | ICD-10-CM

## 2013-05-15 DIAGNOSIS — I10 Essential (primary) hypertension: Secondary | ICD-10-CM

## 2013-05-15 DIAGNOSIS — Z01419 Encounter for gynecological examination (general) (routine) without abnormal findings: Secondary | ICD-10-CM | POA: Insufficient documentation

## 2013-05-15 DIAGNOSIS — Z0000 Encounter for general adult medical examination without abnormal findings: Secondary | ICD-10-CM

## 2013-05-15 DIAGNOSIS — E785 Hyperlipidemia, unspecified: Secondary | ICD-10-CM

## 2013-05-15 DIAGNOSIS — IMO0001 Reserved for inherently not codable concepts without codable children: Secondary | ICD-10-CM

## 2013-05-15 LAB — POC HEMOCCULT BLD/STL (OFFICE/1-CARD/DIAGNOSTIC): Fecal Occult Blood, POC: NEGATIVE

## 2013-05-15 NOTE — Assessment & Plan Note (Signed)
Exam completed as documented. Pt encouraged to continue lifestyle changes that have improved her health Pap sent

## 2013-05-15 NOTE — Patient Instructions (Addendum)
F/u in early December, call if you need me before  Fasting lipid, cmp and EGFR in August when you are getting labs for Dr Fransico Him please, result note will be sent to you  Pls recommit to walking daily, keep sugar and fat intake down and continue weight loss and improved health, I am very happy you are SO mUCH better  Blood pressure goal is 130/80 or less

## 2013-05-15 NOTE — Progress Notes (Signed)
  Subjective:    Patient ID: Glenda Thompson, female    DOB: 1971/04/17, 42 y.o.   MRN: [id number]  HPI The PT is here for follow up and re-evaluation of chronic medical conditions, as well as her annual exam,medication management and review of any available recent lab and radiology data.  Preventive health is updated, specifically  Cancer screening and Immunization.   Since starting with endocrine re her uncontrolled diabetes, she has lost nearly 30 pounds and states her HBA1C was last at a 7, next is due in August, I am extremely happy about this, and so is she. She had been walking up to 2 weeks ago, and knows to resume , since she has gained weight since stopping The PT denies any adverse reactions to current medications since the last visit.  There are no new concerns.  There are no specific complaints       Review of Systems See HPI Denies recent fever or chills. Denies sinus pressure, nasal congestion, ear pain or sore throat. Denies chest congestion, productive cough or wheezing. Denies chest pains, palpitations and leg swelling Denies abdominal pain, nausea, vomiting,diarrhea or constipation.   Denies dysuria, frequency, hesitancy or incontinence. Denies joint pain, swelling and limitation in mobility. Denies headaches, seizures, numbness, or tingling. Denies depression, anxiety or insomnia. Denies skin break down or rash.        Objective:   Physical Exam    Pleasant well nourished female, alert and oriented x 3, in no cardio-pulmonary distress. Afebrile. HEENT No facial trauma or asymetry. Sinuses non tender.  EOMI, PERTL, fundoscopic exam is normal, no hemorhage or exudate.  External ears normal, tympanic membranes clear. Oropharynx moist, no exudate, good dentition. Neck: supple, no adenopathy,JVD or thyromegaly.No bruits.  Chest: Clear to ascultation bilaterally.No crackles or wheezes. Non tender to palpation  Breast: No asymetry,no masses. No nipple  discharge or inversion. No axillary or supraclavicular adenopathy  Cardiovascular system; Heart sounds normal,  S1 and  S2 ,no S3.  No murmur, or thrill. Apical beat not displaced Peripheral pulses normal.  Abdomen: Soft, non tender, no organomegaly or masses. No bruits. Bowel sounds normal. No guarding, tenderness or rebound.  Rectal:  No mass. Guaiac negative stool.  GU: External genitalia normal. No lesions. Vaginal canal normal.No discharge. Uterus normal size, no adnexal masses, no cervical motion or adnexal tenderness.  Musculoskeletal exam: Full ROM of spine, hips , shoulders and knees. No deformity ,swelling or crepitus noted. No muscle wasting or atrophy.   Neurologic: Cranial nerves 2 to 12 intact. Power, tone ,sensation and reflexes normal throughout. No disturbance in gait. No tremor.  Skin: Intact, no ulceration, erythema , scaling or rash noted. Pigmentation normal throughout  Psych; Normal mood and affect. Judgement and concentration normal     Assessment & Plan:

## 2013-05-15 NOTE — Assessment & Plan Note (Signed)
Hyperlipidemia:Low fat diet discussed and encouraged.  Updated lab in August

## 2013-05-15 NOTE — Assessment & Plan Note (Signed)
Improved pre pt history, will look out for data from endo Patient advised to reduce carb and sweets, commit to regular physical activity, take meds as prescribed, test blood as directed, and attempt to lose weight, to improve blood sugar control.

## 2013-05-15 NOTE — Assessment & Plan Note (Signed)
Uncontrolled, pt requests defferment of med increase suggested, she is aware SBP goal is 130 or less. States she has "not been doing right" in the opast several weeks DASH diet and commitment to daily physical activity for a minimum of 30 minutes discussed and encouraged, as a part of hypertension management. The importance of attaining a healthy weight is also discussed.

## 2013-05-16 ENCOUNTER — Other Ambulatory Visit: Payer: Self-pay | Admitting: Family Medicine

## 2013-05-16 ENCOUNTER — Other Ambulatory Visit: Payer: Self-pay

## 2013-05-16 DIAGNOSIS — J309 Allergic rhinitis, unspecified: Secondary | ICD-10-CM

## 2013-05-16 MED ORDER — LORATADINE 10 MG PO TABS: 10.0000 mg | ORAL_TABLET | Freq: Every day | ORAL | Status: DC

## 2013-05-29 ENCOUNTER — Other Ambulatory Visit: Payer: Self-pay

## 2013-08-29 ENCOUNTER — Other Ambulatory Visit: Payer: Self-pay

## 2013-09-05 ENCOUNTER — Other Ambulatory Visit: Payer: Self-pay | Admitting: Family Medicine

## 2013-09-05 LAB — COMPLETE METABOLIC PANEL WITH GFR
ALT: 9 U/L (ref 0–35)
AST: 12 U/L (ref 0–37)
Albumin: 3.9 g/dL (ref 3.5–5.2)
Alkaline Phosphatase: 42 U/L (ref 39–117)
BUN: 19 mg/dL (ref 6–23)
Chloride: 100 mEq/L (ref 96–112)
Potassium: 3.9 mEq/L (ref 3.5–5.3)
Sodium: 133 mEq/L — ABNORMAL LOW (ref 135–145)

## 2013-09-05 LAB — LIPID PANEL
Cholesterol: 193 mg/dL (ref 0–200)
LDL Cholesterol: 123 mg/dL — ABNORMAL HIGH (ref 0–99)
Total CHOL/HDL Ratio: 3.6 Ratio
VLDL: 16 mg/dL (ref 0–40)

## 2013-09-10 ENCOUNTER — Telehealth: Payer: Self-pay | Admitting: Family Medicine

## 2013-09-10 NOTE — Telephone Encounter (Signed)
Would call in something for sinus to [facility] she has already tried OTC medicine

## 2013-09-11 NOTE — Telephone Encounter (Signed)
pls send in flonase sparay 2 puffs daily, will need an appt if she thinks she needs antibiotics, I do noth have a clue what this message is really saying as far as that  is concerned, so address appropriately pls

## 2013-09-12 ENCOUNTER — Other Ambulatory Visit: Payer: Self-pay

## 2013-09-12 MED ORDER — FLUTICASONE PROPIONATE 50 MCG/ACT NA SUSP: 1.0000 | Freq: Every day | NASAL | Status: DC

## 2013-09-12 NOTE — Telephone Encounter (Signed)
Patient aware.  Med sent to [facility]

## 2013-09-25 ENCOUNTER — Encounter: Payer: Self-pay | Admitting: Family Medicine

## 2013-09-25 ENCOUNTER — Ambulatory Visit (INDEPENDENT_AMBULATORY_CARE_PROVIDER_SITE_OTHER): Payer: 59 | Admitting: Family Medicine

## 2013-09-25 VITALS — BP 140/80 | HR 59 | Resp 16 | Wt 227.0 lb

## 2013-09-25 DIAGNOSIS — E669 Obesity, unspecified: Secondary | ICD-10-CM

## 2013-09-25 DIAGNOSIS — E559 Vitamin D deficiency, unspecified: Secondary | ICD-10-CM

## 2013-09-25 DIAGNOSIS — I10 Essential (primary) hypertension: Secondary | ICD-10-CM

## 2013-09-25 DIAGNOSIS — Z23 Encounter for immunization: Secondary | ICD-10-CM

## 2013-09-25 DIAGNOSIS — E785 Hyperlipidemia, unspecified: Secondary | ICD-10-CM

## 2013-09-25 DIAGNOSIS — IMO0002 Reserved for concepts with insufficient information to code with codable children: Secondary | ICD-10-CM

## 2013-09-25 DIAGNOSIS — E1165 Type 2 diabetes mellitus with hyperglycemia: Secondary | ICD-10-CM

## 2013-09-25 MED ORDER — AMLODIPINE BESYLATE 5 MG PO TABS: 5.0000 mg | ORAL_TABLET | Freq: Every day | ORAL | Status: DC

## 2013-09-25 MED ORDER — ERGOCALCIFEROL 1.25 MG (50000 UT) PO CAPS: 50000.0000 [IU] | ORAL_CAPSULE | ORAL | Status: DC

## 2013-09-25 NOTE — Progress Notes (Signed)
   Subjective:    Patient ID: Glenda Thompson, female    DOB: Mar 06, 1971, 42 y.o.   MRN: [id number]  HPI The PT is here for follow up and re-evaluation of chronic medical conditions, medication management and review of any available recent lab and radiology data.  Preventive health is updated, specifically  Cancer screening and Immunization.   Questions or concerns regarding consultations or procedures which the PT has had in the interim are  Addressed.Pleased with marked improvement in her blood sugar, she has changed diet, committed to daily exercise and lost 40 pounds since last seen. Working closely with endo and is happy with her success The PT denies any adverse reactions to current medications since the last visit.  There are no new concerns.  There are no specific complaints       Review of Systems See HPI Denies recent fever or chills. Denies sinus pressure, nasal congestion, ear pain or sore throat. Denies chest congestion, productive cough or wheezing. Denies chest pains, palpitations and leg swelling Denies abdominal pain, nausea, vomiting,diarrhea or constipation.   Denies dysuria, frequency, hesitancy or incontinence. Denies joint pain, swelling and limitation in mobility. Denies headaches, seizures, numbness, or tingling. Denies depression, anxiety or insomnia. Denies skin break down or rash.        Objective:   Physical Exam  Patient alert and oriented and in no cardiopulmonary distress.  HEENT: No facial asymmetry, EOMI, no sinus tenderness,  oropharynx pink and moist.  Neck supple no adenopathy.  Chest: Clear to auscultation bilaterally.  CVS: S1, S2 no murmurs, no S3.  ABD: Soft non tender. Bowel sounds normal.  Ext: No edema  MS: Adequate ROM spine, shoulders, hips and knees.  Skin: Intact, no ulcerations or rash noted.  Psych: Good eye contact, normal affect. Memory intact not anxious or depressed appearing.  CNS: CN 2-12 intact, power, tone  and sensation normal throughout.       Assessment & Plan:

## 2013-09-25 NOTE — Patient Instructions (Addendum)
F/u in 2 month, call if you need me before  Flu vaccine  Today  Stop clonidine and new for blood pressure is am,lodipine5 mg one daily  Please re commit to regular exercise and healthy diet so that your health continues to improve  Congrats on blood sugar control and weight loss  New is once weekly vit D for 6 month  Please sign for eye exam to be sent here

## 2013-10-09 ENCOUNTER — Encounter: Payer: Self-pay | Admitting: Family Medicine

## 2013-10-27 NOTE — Assessment & Plan Note (Signed)
Supplement for at least 6 month

## 2013-10-27 NOTE — Assessment & Plan Note (Signed)
Improved, but needs to improve further. Pt reports being less diligent with diet and exercise in the past 2 months, needs to resume this. Patient advised to reduce carb and sweets, commit to regular physical activity, take meds as prescribed, test blood as directed, and attempt to lose weight, to improve blood sugar control. Management per endo

## 2013-10-27 NOTE — Assessment & Plan Note (Signed)
Improved. Pt applauded on succesful weight loss through lifestyle change, and encouraged to continue same. Weight loss goal set for the next several months.  

## 2013-10-27 NOTE — Assessment & Plan Note (Signed)
Uncontrolled, lDL elevated. Hyperlipidemia:Low fat diet discussed and encouraged.  Update la in 6 months from draw

## 2013-10-27 NOTE — Assessment & Plan Note (Signed)
Not at goal, chnage to amlodipine. DASH diet and commitment to daily physical activity for a minimum of 30 minutes discussed and encouraged, as a part of hypertension management. The importance of attaining a healthy weight is also discussed.

## 2013-11-13 ENCOUNTER — Other Ambulatory Visit: Payer: Self-pay | Admitting: Family Medicine

## 2013-11-15 ENCOUNTER — Other Ambulatory Visit: Payer: Self-pay | Admitting: Family Medicine

## 2013-11-27 ENCOUNTER — Ambulatory Visit: Payer: 59 | Admitting: Family Medicine

## 2013-12-09 ENCOUNTER — Encounter: Payer: Self-pay | Admitting: Family Medicine

## 2013-12-09 ENCOUNTER — Ambulatory Visit (INDEPENDENT_AMBULATORY_CARE_PROVIDER_SITE_OTHER): Payer: 59 | Admitting: Family Medicine

## 2013-12-09 VITALS — BP 160/90 | HR 77 | Resp 16 | Ht 65.0 in | Wt 220.4 lb

## 2013-12-09 DIAGNOSIS — E559 Vitamin D deficiency, unspecified: Secondary | ICD-10-CM

## 2013-12-09 DIAGNOSIS — E669 Obesity, unspecified: Secondary | ICD-10-CM

## 2013-12-09 DIAGNOSIS — E785 Hyperlipidemia, unspecified: Secondary | ICD-10-CM

## 2013-12-09 DIAGNOSIS — IMO0002 Reserved for concepts with insufficient information to code with codable children: Secondary | ICD-10-CM

## 2013-12-09 DIAGNOSIS — R5381 Other malaise: Secondary | ICD-10-CM

## 2013-12-09 DIAGNOSIS — I10 Essential (primary) hypertension: Secondary | ICD-10-CM

## 2013-12-09 DIAGNOSIS — IMO0001 Reserved for inherently not codable concepts without codable children: Secondary | ICD-10-CM

## 2013-12-09 DIAGNOSIS — E1165 Type 2 diabetes mellitus with hyperglycemia: Secondary | ICD-10-CM

## 2013-12-09 DIAGNOSIS — R5383 Other fatigue: Secondary | ICD-10-CM

## 2013-12-09 DIAGNOSIS — Z1239 Encounter for other screening for malignant neoplasm of breast: Secondary | ICD-10-CM

## 2013-12-09 MED ORDER — AMLODIPINE BESYLATE 10 MG PO TABS: 10.0000 mg | ORAL_TABLET | Freq: Every day | ORAL | Status: DC

## 2013-12-09 NOTE — Patient Instructions (Signed)
F/U in 6 weeks, call if you need me before  It is important that you exercise regularly at least 30 minutes 5 times a week. If you develop chest pain, have severe difficulty breathing, or feel very tired, stop exercising immediately and seek medical attention    A healthy diet is rich in fruit, vegetables and whole grains. Poultry fish, nuts and beans are a healthy choice for protein rather then red meat. A low sodium diet and drinking 64 ounces of water daily is generally recommended. Oils and sweet should be limited. Carbohydrates especially for those who are diabetic or overweight, should be limited to 45 to 60 gram per meal. It is important to eat on a regular schedule, at least 3 times daily. Snacks should be primarily fruits, vegetables or nuts.  Adequate rest, generally 6 to 8 hours per night is important for good health.Good sleep hygiene involves setting a regular bedtime, and turning off all sound and light in your sleep environment.Limiting caffeine intake will also help with the ability to rest well  Lab work needs to be done with labs that you are getting for Dr Dorris Fetch, fasting lipid, cmp and EGFR  All medications need to be brought to every visit

## 2013-12-10 NOTE — Assessment & Plan Note (Signed)
Uncpontroled, dose inc in amlodipine DASH diet and commitment to daily physical activity for a minimum of 30 minutes discussed and encouraged, as a part of hypertension management. The importance of attaining a healthy weight is also discussed.

## 2013-12-10 NOTE — Assessment & Plan Note (Signed)
States blood sugars have increased due to lack of exercise and poor eating habits. Has upcoming appt with endo who treats her diabetes. Patient advised to reduce carb and sweets, commit to regular physical activity, take meds as prescribed, test blood as directed, and attempt to lose weight, to improve blood sugar control.

## 2013-12-10 NOTE — Progress Notes (Signed)
   Subjective:    Patient ID: Glenda Thompson, female    DOB: [birth year], 43 y.o.   MRN: [id number]  HPI The PT is here for follow up and re-evaluation of chronic medical conditions,specifically HTN, had med changed from clonidine to amlodipine due to excess sedation and review of any available recent lab and radiology data.  Preventive health is updated, specifically  Cancer screening and Immunization.    The PT denies any adverse reactions to current medications since the last visit.  There are no new concerns. States her blood sugar is not as good as it was due to breakdown in he habits, also admits to excessive salt intake, attributes this to the fact that she has been moving Denies polyuria, polydipsia , blurred vision or hypoglycemic episodes        Review of Systems See HPI Denies recent fever or chills. Denies sinus pressure, nasal congestion, ear pain or sore throat. Denies chest congestion, productive cough or wheezing. Denies chest pains, palpitations and leg swelling Denies abdominal pain, nausea, vomiting,diarrhea or constipation.   Denies dysuria, frequency, hesitancy or incontinence. Denies joint pain, swelling and limitation in mobility. Denies headaches, seizures, numbness, or tingling. Denies depression, anxiety or insomnia. Denies skin break down or rash.        Objective:   Physical Exam  BP 160/90  Pulse 77  Resp 16  Ht 5\' 5"  (1.651 m)  Wt 220 lb 6.4 oz (99.973 kg)  BMI 36.68 kg/m2  SpO2 100% Patient alert and oriented and in no cardiopulmonary distress.  HEENT: No facial asymmetry, EOMI, no sinus tenderness,  oropharynx pink and moist.  Neck supple no adenopathy.  Chest: Clear to auscultation bilaterally.  CVS: S1, S2 no murmurs, no S3.  ABD: Soft non tender. Bowel sounds normal.  Ext: No edema  MS: Adequate ROM spine, shoulders, hips and knees.  Skin: Intact, no ulcerations or rash noted.  Psych: Good eye contact, normal affect.  Memory intact not anxious or depressed appearing.  CNS: CN 2-12 intact, power, tone and sensation normal throughout.       Assessment & Plan:  Hypertension goal BP (blood pressure) < 130/80 Uncpontroled, dose inc in amlodipine DASH diet and commitment to daily physical activity for a minimum of 30 minutes discussed and encouraged, as a part of hypertension management. The importance of attaining a healthy weight is also discussed.   Type 2 diabetes mellitus, uncontrolled States blood sugars have increased due to lack of exercise and poor eating habits. Has upcoming appt with endo who treats her diabetes. Patient advised to reduce carb and sweets, commit to regular physical activity, take meds as prescribed, test blood as directed, and attempt to lose weight, to improve blood sugar control.   HYPERLIPIDEMIA Hyperlipidemia:Low fat diet discussed and encouraged.  Uncontrolled Updated lab needed at/ before next visit.   OBESITY Improved.Not certain if due to uncontrolled blood sugar , hopefully this is not the case Pt applauded on succesful weight loss through lifestyle change, and encouraged to continue same. Weight loss goal set for the next several months.   Vitamin D deficiency Updated lab needed at/ before next visit. Continue weekly supplement

## 2013-12-10 NOTE — Assessment & Plan Note (Signed)
Improved.Not certain if due to uncontrolled blood sugar , hopefully this is not the case Pt applauded on succesful weight loss through lifestyle change, and encouraged to continue same. Weight loss goal set for the next several months.

## 2013-12-10 NOTE — Assessment & Plan Note (Signed)
Updated lab needed at/ before next visit. Continue weekly supplement 

## 2013-12-10 NOTE — Assessment & Plan Note (Signed)
Hyperlipidemia:Low fat diet discussed and encouraged. Uncontrolled Updated lab needed at/ before next visit.    

## 2013-12-12 LAB — LIPID PANEL
Cholesterol: 175 mg/dL (ref 0–200)
HDL: 67 mg/dL (ref >39)
LDL Cholesterol: 97 mg/dL (ref 0–99)
Total CHOL/HDL Ratio: 2.6 Ratio
Triglycerides: 55 mg/dL (ref <150)
VLDL: 11 mg/dL (ref 0–40)

## 2013-12-12 LAB — COMPLETE METABOLIC PANEL WITH GFR
ALBUMIN: 4.2 g/dL (ref 3.5–5.2)
ALT: 12 U/L (ref 0–35)
AST: 14 U/L (ref 0–37)
Alkaline Phosphatase: 40 U/L (ref 39–117)
BUN: 20 mg/dL (ref 6–23)
CO2: 30 mEq/L (ref 19–32)
Calcium: 9.4 mg/dL (ref 8.4–10.5)
Chloride: 103 mEq/L (ref 96–112)
Creat: 0.81 mg/dL (ref 0.50–1.10)
GFR, Est African American: >89 mL/min
GFR, Est Non African American: 89 mL/min
Glucose, Bld: 105 mg/dL — ABNORMAL HIGH (ref 70–99)
Potassium: 4.2 mEq/L (ref 3.5–5.3)
Sodium: 138 mEq/L (ref 135–145)
Total Bilirubin: 0.6 mg/dL (ref 0.2–1.2)
Total Protein: 6.7 g/dL (ref 6.0–8.3)

## 2013-12-17 ENCOUNTER — Ambulatory Visit (HOSPITAL_COMMUNITY)
Admission: RE | Admit: 2013-12-17 | Discharge: 2013-12-17 | Disposition: A | Discharge status: Home / Self Care | Payer: 59 | Source: Ambulatory Visit | Attending: Family Medicine | Admitting: Family Medicine

## 2013-12-17 DIAGNOSIS — Z1231 Encounter for screening mammogram for malignant neoplasm of breast: Secondary | ICD-10-CM | POA: Insufficient documentation

## 2013-12-17 DIAGNOSIS — Z1239 Encounter for other screening for malignant neoplasm of breast: Secondary | ICD-10-CM

## 2014-01-21 ENCOUNTER — Ambulatory Visit: Payer: 59 | Admitting: Family Medicine

## 2014-04-09 ENCOUNTER — Other Ambulatory Visit: Payer: Self-pay | Admitting: Family Medicine

## 2014-04-09 ENCOUNTER — Encounter: Payer: Self-pay | Admitting: Family Medicine

## 2014-04-09 ENCOUNTER — Ambulatory Visit (INDEPENDENT_AMBULATORY_CARE_PROVIDER_SITE_OTHER): Payer: 59 | Admitting: Family Medicine

## 2014-04-09 VITALS — BP 150/84 | HR 77 | Resp 16 | Wt 234.1 lb

## 2014-04-09 DIAGNOSIS — IMO0002 Reserved for concepts with insufficient information to code with codable children: Secondary | ICD-10-CM

## 2014-04-09 DIAGNOSIS — I10 Essential (primary) hypertension: Secondary | ICD-10-CM

## 2014-04-09 DIAGNOSIS — E669 Obesity, unspecified: Secondary | ICD-10-CM

## 2014-04-09 DIAGNOSIS — E785 Hyperlipidemia, unspecified: Secondary | ICD-10-CM

## 2014-04-09 DIAGNOSIS — IMO0001 Reserved for inherently not codable concepts without codable children: Secondary | ICD-10-CM

## 2014-04-09 DIAGNOSIS — E1165 Type 2 diabetes mellitus with hyperglycemia: Secondary | ICD-10-CM

## 2014-04-09 MED ORDER — AMLODIPINE BESYLATE 10 MG PO TABS: 10.0000 mg | ORAL_TABLET | Freq: Every day | ORAL | Status: DC

## 2014-04-09 MED ORDER — SPIRONOLACTONE 25 MG PO TABS: 25.0000 mg | ORAL_TABLET | Freq: Every day | ORAL | Status: DC

## 2014-04-09 MED ORDER — LISINOPRIL-HYDROCHLOROTHIAZIDE 20-12.5 MG PO TABS: ORAL_TABLET | ORAL | Status: DC

## 2014-04-09 NOTE — Assessment & Plan Note (Signed)
Significant weight gain and pt not testing blood sugar, has appt with endo tomorrow, also has not been exercising Denies symptoms of hyperglycemia Patient advised to reduce carb and sweets, commit to regular physical activity, take meds as prescribed, test blood as directed, and attempt to lose weight, to improve blood sugar control.

## 2014-04-09 NOTE — Patient Instructions (Signed)
F/u in 6 weeks , call if you need me before  Non fasting chem 7 and EGFr  2 days before visit  Blood pressure is high, new additional medication to be started today , spironolactone one daily  Please commit to daily exercise and reducing portion size so that your blood pressure improves and that you attain a healthy weight  Cholesterol is excellent , continue current dose of medication  Weight loss goal of 5 pounds in next 6 weeks

## 2014-04-09 NOTE — Assessment & Plan Note (Signed)
Uncontrolled, add spironolactone 25 mg daily  DASH diet and commitment to daily physical activity for a minimum of 30 minutes discussed and encouraged, as a part of hypertension management. The importance of attaining a healthy weight is also discussed. F/u in 6 weeks, chem 7 and EGFR before visit

## 2014-04-09 NOTE — Assessment & Plan Note (Addendum)
Hyperlipidemia:Low fat diet discussed and encouraged. Currently controlled, on current medication pt to continue same

## 2014-04-09 NOTE — Progress Notes (Signed)
   Subjective:    Patient ID: Glenda Thompson, female    DOB: [birth year], 43 y.o.   MRN: [id number]  HPI The PT is here for follow up and re-evaluation of chronic medical conditions, medication management and review of any available recent lab and radiology data.  Preventive health is updated, specifically  Cancer screening and Immunization.   Has appt with endo in the am, she nhas neither been walking or testing blood sugars, taking care of an aunt who recently had surgery. The PT denies any adverse reactions to current medications since the last visit.  Concerned about recent weight gain and intends to work to reverse this      Review of Systems See HPI' Denies recent fever or chills. Denies sinus pressure, nasal congestion, ear pain or sore throat. Denies chest congestion, productive cough or wheezing. Denies chest pains, palpitations and leg swelling Denies abdominal pain, nausea, vomiting,diarrhea or constipation.   Denies dysuria, frequency, hesitancy or incontinence. Denies joint pain, swelling and limitation in mobility. Denies headaches, seizures, numbness, or tingling. Denies depression, anxiety or insomnia. Denies skin break down or rash.        Objective:   Physical Exam   BP 150/84  Pulse 77  Resp 16  Wt 234 lb 1.9 oz (106.196 kg)  SpO2 100% Patient alert and oriented and in no cardiopulmonary distress.  HEENT: No facial asymmetry, EOMI,   oropharynx pink and moist.  Neck supple no JVD, no mass.  Chest: Clear to auscultation bilaterally.  CVS: S1, S2 no murmurs, no S3.  ABD: Soft non tender.   Ext: No edema  MS: Adequate ROM spine, shoulders, hips and knees.  Skin: Intact, no ulcerations or rash noted.  Psych: Good eye contact, normal affect. Memory intact not anxious or depressed appearing.  CNS: CN 2-12 intact, power,  normal throughout.no focal deficits noted.      Assessment & Plan:  Hypertension goal BP (blood pressure) <  130/80 Uncontrolled, add spironolactone 25 mg daily  DASH diet and commitment to daily physical activity for a minimum of 30 minutes discussed and encouraged, as a part of hypertension management. The importance of attaining a healthy weight is also discussed. F/u in 6 weeks, chem 7 and EGFR before visit  Type 2 diabetes mellitus, uncontrolled Significant weight gain and pt not testing blood sugar, has appt with endo tomorrow, also has not been exercising Denies symptoms of hyperglycemia Patient advised to reduce carb and sweets, commit to regular physical activity, take meds as prescribed, test blood as directed, and attempt to lose weight, to improve blood sugar control.   OBESITY Deteriorated. Patient re-educated about  the importance of commitment to a  minimum of 150 minutes of exercise per week. The importance of healthy food choices with portion control discussed. Encouraged to start a food diary, count calories and to consider  joining a support group. Sample diet sheets offered. Goals set by the patient for the next several months.     HYPERLIPIDEMIA Hyperlipidemia:Low fat diet discussed and encouraged. Currently controlled, on current medication pt to continue same

## 2014-04-09 NOTE — Assessment & Plan Note (Signed)
Deteriorated. Patient re-educated about  the importance of commitment to a  minimum of 150 minutes of exercise per week. The importance of healthy food choices with portion control discussed. Encouraged to start a food diary, count calories and to consider  joining a support group. Sample diet sheets offered. Goals set by the patient for the next several months.    

## 2014-05-09 LAB — HEMOGLOBIN A1C: Hemoglobin-A1c: 6.8

## 2014-05-21 ENCOUNTER — Ambulatory Visit: Payer: 59 | Admitting: Family Medicine

## 2014-06-12 ENCOUNTER — Ambulatory Visit: Payer: 59 | Admitting: Family Medicine

## 2014-06-13 LAB — COMPLETE METABOLIC PANEL WITH GFR
ALBUMIN: 4.1 g/dL (ref 3.5–5.2)
ALT: 13 U/L (ref 0–35)
AST: 13 U/L (ref 0–37)
Alkaline Phosphatase: 38 U/L — ABNORMAL LOW (ref 39–117)
BILIRUBIN TOTAL: 0.8 mg/dL (ref 0.2–1.2)
BUN: 24 mg/dL — ABNORMAL HIGH (ref 6–23)
CO2: 26 mEq/L (ref 19–32)
Calcium: 9.1 mg/dL (ref 8.4–10.5)
Chloride: 102 mEq/L (ref 96–112)
Creat: 0.9 mg/dL (ref 0.50–1.10)
GFR, EST NON AFRICAN AMERICAN: 79 mL/min
GFR, Est African American: >89 mL/min
GLUCOSE: 176 mg/dL — AB (ref 70–99)
Potassium: 4.3 mEq/L (ref 3.5–5.3)
Sodium: 136 mEq/L (ref 135–145)
TOTAL PROTEIN: 6.6 g/dL (ref 6.0–8.3)

## 2014-06-16 ENCOUNTER — Encounter: Payer: Self-pay | Admitting: Family Medicine

## 2014-06-16 ENCOUNTER — Ambulatory Visit (INDEPENDENT_AMBULATORY_CARE_PROVIDER_SITE_OTHER): Payer: BC Managed Care – PPO | Admitting: Family Medicine

## 2014-06-16 VITALS — BP 130/70 | HR 70 | Resp 18 | Ht 65.0 in | Wt 238.0 lb

## 2014-06-16 DIAGNOSIS — I10 Essential (primary) hypertension: Secondary | ICD-10-CM

## 2014-06-16 DIAGNOSIS — E1165 Type 2 diabetes mellitus with hyperglycemia: Secondary | ICD-10-CM

## 2014-06-16 DIAGNOSIS — R5383 Other fatigue: Secondary | ICD-10-CM

## 2014-06-16 DIAGNOSIS — IMO0001 Reserved for inherently not codable concepts without codable children: Secondary | ICD-10-CM

## 2014-06-16 DIAGNOSIS — Z23 Encounter for immunization: Secondary | ICD-10-CM

## 2014-06-16 DIAGNOSIS — R5381 Other malaise: Secondary | ICD-10-CM

## 2014-06-16 DIAGNOSIS — IMO0002 Reserved for concepts with insufficient information to code with codable children: Secondary | ICD-10-CM

## 2014-06-16 DIAGNOSIS — E669 Obesity, unspecified: Secondary | ICD-10-CM

## 2014-06-16 DIAGNOSIS — E785 Hyperlipidemia, unspecified: Secondary | ICD-10-CM

## 2014-06-16 MED ORDER — PHENTERMINE HCL 37.5 MG PO TABS: 37.5000 mg | ORAL_TABLET | Freq: Every day | ORAL | Status: DC

## 2014-06-16 NOTE — Patient Instructions (Addendum)
F/u in 3.5 month, with rectal, call if you need me before  Flu vaccine today  New to help with weight is phentermine HALF tab daily  Commit to good eating habits and daily exercise for at least 30 mins  BP is good and so is labwork  Fasting lipid, cmp and EGFr , CBC and TSH in 3.5 month

## 2014-06-30 DIAGNOSIS — Z23 Encounter for immunization: Secondary | ICD-10-CM | POA: Insufficient documentation

## 2014-06-30 NOTE — Assessment & Plan Note (Signed)
Controlled, no change in medication DASH diet and commitment to daily physical activity for a minimum of 30 minutes discussed and encouraged, as a part of hypertension management. The importance of attaining a healthy weight is also discussed.  

## 2014-06-30 NOTE — Assessment & Plan Note (Signed)
Updated lab needed at/ before next visit. Hyperlipidemia:Low fat diet discussed and encouraged.  Controlled when last checked 

## 2014-06-30 NOTE — Assessment & Plan Note (Signed)
Deteriorated. Patient re-educated about  the importance of commitment to a  minimum of 150 minutes of exercise per week. The importance of healthy food choices with portion control discussed. Encouraged to start a food diary, count calories and to consider  joining a support group. Sample diet sheets offered. Goals set by the patient for the next several months.   Start half phentermine daily, weight loss goal of 4 pounds per month

## 2014-06-30 NOTE — Assessment & Plan Note (Signed)
Treated by endo. Reports recent deterioration in blood sugar control with lack of exercise and weight gain, intends to reverse this Patient advised to reduce carb and sweets, commit to regular physical activity, take meds as prescribed, test blood as directed, and attempt to lose weight, to improve blood sugar control. Updated lab needed from endo

## 2014-06-30 NOTE — Progress Notes (Signed)
   Subjective:    Patient ID: Glenda Thompson, female    DOB: 1970-11-26, 43 y.o.   MRN: [id number]  HPI The PT is here for follow up and re-evaluation of chronic medical conditions, in particular uncontrolled blood pressure,medication management and review of any available recent lab and radiology data.  Preventive health is updated, specifically  Cancer screening and Immunization.   Questions or concerns regarding consultations or procedures which the PT has had in the interim are  addressed. The PT denies any adverse reactions to current medications since the last visit.  Wants to start appetite suppressant to help with weight reduction      Review of Systems See HPI Denies recent fever or chills. Denies sinus pressure, nasal congestion, ear pain or sore throat. Denies chest congestion, productive cough or wheezing. Denies chest pains, palpitations and leg swelling Denies abdominal pain, nausea, vomiting,diarrhea or constipation.   Denies dysuria, frequency, hesitancy or incontinence. Denies joint pain, swelling and limitation in mobility. Denies headaches, seizures, numbness, or tingling. Denies depression, anxiety or insomnia. Denies skin break down or rash.        Objective:   Physical Exam BP 130/70  Pulse 70  Resp 18  Ht  (1.651 m)  Wt 238 lb (107.956 kg)  BMI 39.61 kg/m2  SpO2 99% Patient alert and oriented and in no cardiopulmonary distress.  HEENT: No facial asymmetry, EOMI,   oropharynx pink and moist.  Neck supple no JVD, no mass.  Chest: Clear to auscultation bilaterally.  CVS: S1, S2 no murmurs, no S3.Regular rate.  ABD: Soft non tender.   Ext: No edema  MS: Adequate ROM spine, shoulders, hips and knees.  Skin: Intact, no ulcerations or rash noted.  Psych: Good eye contact, normal affect. Memory intact not anxious or depressed appearing.  CNS: CN 2-12 intact, power,  normal throughout.no focal deficits noted.        Assessment &  Plan:  Hypertension goal BP (blood pressure) < 130/80 Controlled, no change in medication DASH diet and commitment to daily physical activity for a minimum of 30 minutes discussed and encouraged, as a part of hypertension management. The importance of attaining a healthy weight is also discussed.   HYPERLIPIDEMIA Updated lab needed at/ before next visit. Hyperlipidemia:Low fat diet discussed and encouraged.  Controlled when last checked   OBESITY Deteriorated. Patient re-educated about  the importance of commitment to a  minimum of 150 minutes of exercise per week. The importance of healthy food choices with portion control discussed. Encouraged to start a food diary, count calories and to consider  joining a support group. Sample diet sheets offered. Goals set by the patient for the next several months.   Start half phentermine daily, weight loss goal of 4 pounds per month  DM type 2 (diabetes mellitus, type 2) Treated by endo. Reports recent deterioration in blood sugar control with lack of exercise and weight gain, intends to reverse this Patient advised to reduce carb and sweets, commit to regular physical activity, take meds as prescribed, test blood as directed, and attempt to lose weight, to improve blood sugar control. Updated lab needed from endo   Need for prophylactic vaccination and inoculation against influenza Vaccine administered at visit.   FATIGUE Check CBC

## 2014-06-30 NOTE — Assessment & Plan Note (Signed)
Vaccine administered at visit.  

## 2014-06-30 NOTE — Assessment & Plan Note (Signed)
Check CBC 

## 2014-08-08 ENCOUNTER — Encounter: Payer: BC Managed Care – PPO | Attending: "Endocrinology | Admitting: Nutrition

## 2014-08-08 VITALS — Ht 65.0 in | Wt 240.0 lb

## 2014-08-08 DIAGNOSIS — IMO0002 Reserved for concepts with insufficient information to code with codable children: Secondary | ICD-10-CM

## 2014-08-08 DIAGNOSIS — E1165 Type 2 diabetes mellitus with hyperglycemia: Secondary | ICD-10-CM

## 2014-08-08 DIAGNOSIS — E118 Type 2 diabetes mellitus with unspecified complications: Principal | ICD-10-CM

## 2014-08-08 NOTE — Patient Instructions (Signed)
Plan:  Aim for 2-3 Carb Choices per meal (30-45 grams) +/- 1 either way  Cut out snacks unless a low blood sugars Include protein in moderation with your meals and snacks Consider  increasing your activity level by30-60 minutes d5 days a week. Check blood sugar in the am and 2 hours after supper.Consider taking medication  as directed by MD  Goal 1. Lose 1 lb per week. 2. Get A1C down below 7% in three months.

## 2014-08-08 NOTE — Progress Notes (Signed)
  Medical Nutrition Therapy:  Appt start time: 1030 end time:  1130.  Assessment:  Primary concerns today: Diabetes. Has lost about 55 lbs but has gained about 10 lbs back. Hasn't been walking like she was. Not taking Phentermine anymore due to making her BP go up. Most recent A1C was 7.8%. 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 Does her own shopping and cooking. Most foods baked and broiled. Is currently taking [place]. Was on Metformin but made her nauseated. Checks her BS some, but not a lot. BS 150-160's in the am.  MEDICATIONS: See list  DIETARY INTAKE:  24-hr recall:  B ( AM): Oatmeal, toast, eggs and bacon, coffee, flavored water  Snk  none L  PM): [place] sandwich on ww bread,mayo or mustard. water  Snk ( PM): none D ( PM): Steak, baked potato, roll,  1 piece of cake and toss salad, unsweet tea  Snk ( PM): none usually  Beverages: water, unsweet tea  Recent physical activity: walking twice a week.  Estimated energy needs: 1500 calories 170 g carbohydrates 112 g protein 42  g fat  Progress Towards Goal(s):  In progress.   Nutritional Diagnosis:  NB-1.1 Food and nutrition-related knowledge deficit As related to diabetes.  As evidenced by A1C 7.8%..    Intervention:  Nutrition Nutrition counseling and diabetes education. . Plan:  Aim for 2-3 Carb Choices per meal (30-45 grams) +/- 1 either way  Cut out snacks unless a low blood sugar Include protein in moderation with your meals Consider  increasing your activity level by30-60 minutes d5 days a week. Check blood sugar in the am and 2 hours after supper.Consider taking medication  as directed by MD Notify MD if BS less than 70 twice in a row or in a week. Goal 1. Lose 1 lb per week. 2. Get A1C down below 7% in three months.  Handouts given during visit include: The Plate Method Carb Counting and Food Label handouts Meal Plan Card  Monitoring/Evaluation:  Dietary intake,  exercise,meal planning SBG, and body weight in 1 month(s).,

## 2014-09-08 ENCOUNTER — Ambulatory Visit: Payer: BC Managed Care – PPO | Admitting: Nutrition

## 2014-09-10 ENCOUNTER — Encounter: Payer: BC Managed Care – PPO | Admitting: Nutrition

## 2014-09-12 ENCOUNTER — Other Ambulatory Visit: Payer: Self-pay | Admitting: Family Medicine

## 2014-09-15 ENCOUNTER — Other Ambulatory Visit: Payer: Self-pay | Admitting: Family Medicine

## 2014-09-26 ENCOUNTER — Ambulatory Visit: Payer: BC Managed Care – PPO | Admitting: Nutrition

## 2014-10-02 LAB — COMPLETE METABOLIC PANEL WITH GFR
ALT: 10 U/L (ref 0–35)
AST: 14 U/L (ref 0–37)
Albumin: 4 g/dL (ref 3.5–5.2)
Alkaline Phosphatase: 38 U/L — ABNORMAL LOW (ref 39–117)
BUN: 25 mg/dL — ABNORMAL HIGH (ref 6–23)
CALCIUM: 9.3 mg/dL (ref 8.4–10.5)
CHLORIDE: 102 meq/L (ref 96–112)
CO2: 24 meq/L (ref 19–32)
Creat: 0.99 mg/dL (ref 0.50–1.10)
GFR, EST AFRICAN AMERICAN: 80 mL/min
GFR, Est Non African American: 70 mL/min
Glucose, Bld: 121 mg/dL — ABNORMAL HIGH (ref 70–99)
Potassium: 4.5 mEq/L (ref 3.5–5.3)
Sodium: 135 mEq/L (ref 135–145)
TOTAL PROTEIN: 6.8 g/dL (ref 6.0–8.3)
Total Bilirubin: 0.6 mg/dL (ref 0.2–1.2)

## 2014-10-02 LAB — CBC WITH DIFFERENTIAL/PLATELET
Basophils Absolute: 0 10*3/uL (ref 0.0–0.1)
Basophils Relative: 0 % (ref 0–1)
EOS ABS: 0.2 10*3/uL (ref 0.0–0.7)
EOS PCT: 3 % (ref 0–5)
HEMATOCRIT: 36.8 % (ref 36.0–46.0)
Hemoglobin: 12.7 g/dL (ref 12.0–15.0)
LYMPHS PCT: 34 % (ref 12–46)
Lymphs Abs: 2.3 10*3/uL (ref 0.7–4.0)
MCH: 28 pg (ref 26.0–34.0)
MCHC: 34.5 g/dL (ref 30.0–36.0)
MCV: 81.1 fL (ref 78.0–100.0)
MONO ABS: 0.3 10*3/uL (ref 0.1–1.0)
Monocytes Relative: 4 % (ref 3–12)
Neutro Abs: 4.1 10*3/uL (ref 1.7–7.7)
Neutrophils Relative %: 59 % (ref 43–77)
PLATELETS: 275 10*3/uL (ref 150–400)
RBC: 4.54 MIL/uL (ref 3.87–5.11)
RDW: 13 % (ref 11.5–15.5)
WBC: 6.9 10*3/uL (ref 4.0–10.5)

## 2014-10-02 LAB — LIPID PANEL
Cholesterol: 156 mg/dL (ref 0–200)
HDL: 46 mg/dL (ref >39)
LDL CALC: 99 mg/dL (ref 0–99)
TRIGLYCERIDES: 54 mg/dL (ref <150)
Total CHOL/HDL Ratio: 3.4 Ratio
VLDL: 11 mg/dL (ref 0–40)

## 2014-10-02 LAB — TSH: TSH: 0.737 u[IU]/mL (ref 0.350–4.500)

## 2014-10-10 ENCOUNTER — Encounter: Payer: BC Managed Care – PPO | Attending: "Endocrinology | Admitting: Nutrition

## 2014-10-10 VITALS — Ht 65.0 in | Wt 243.0 lb

## 2014-10-10 DIAGNOSIS — I10 Essential (primary) hypertension: Secondary | ICD-10-CM

## 2014-10-10 DIAGNOSIS — E118 Type 2 diabetes mellitus with unspecified complications: Secondary | ICD-10-CM

## 2014-10-10 DIAGNOSIS — E119 Type 2 diabetes mellitus without complications: Secondary | ICD-10-CM | POA: Diagnosis present

## 2014-10-10 DIAGNOSIS — IMO0002 Reserved for concepts with insufficient information to code with codable children: Secondary | ICD-10-CM

## 2014-10-10 DIAGNOSIS — E669 Obesity, unspecified: Secondary | ICD-10-CM

## 2014-10-10 DIAGNOSIS — E1165 Type 2 diabetes mellitus with hyperglycemia: Secondary | ICD-10-CM

## 2014-10-10 DIAGNOSIS — Z713 Dietary counseling and surveillance: Secondary | ICD-10-CM | POA: Diagnosis not present

## 2014-10-10 NOTE — Patient Instructions (Signed)
Plan:  Cut out fast food. Prepare meals at home and take to work. Eat baked and boiled foods. Walk 30 minutes 4 says a week Choose lower fat and  Higher fiber foods when eating out. Goal: Lose 1 lb per week til next visit. 2. Get A1C to 7% in next three months.

## 2014-10-10 NOTE — Progress Notes (Signed)
  Medical Nutrition Therapy:  Appt start time: 0800 end time:  0830  Assessment:  Primary concerns today: Diabetes. She notes she has not been doing well. She is been really busy and  Has been eating on the run a lot and not taking time for herself. She forgot most of her BS logs. FBS 140-160's. She admits to eating a lot of [employer] and not eating balanced meals. Gained about 5 lbs since last visit and has regained  all her previous weight she had loss. She knows she is not taking the time for herself and eating right like she is suppose to. Willing to change eating habits and lifestyle to get weight back off and get her diabetes under better control. A1C today with Dr. Fransico HimNida was   7.5% which is down from 7.8% from last visit.  MEDICATIONS: See list  DIETARY INTAKE:  24-hr recall:  B ( AM): Oatmeal, toast, eggs and bacon, coffee, flavored water  Snk  none L  PM): [place] sandwich on ww bread,mayo or mustard. water  Snk ( PM): none D ( PM): Steak, baked potato, roll,  1 piece of cake and toss salad, unsweet tea  Snk ( PM): none usually  Beverages: water, unsweet tea  Recent physical activity: walking twice a week.  Estimated energy needs: 1500 calories 170 g carbohydrates 112 g protein 42  g fat  Progress Towards Goal(s):  In progress.   Nutritional Diagnosis:  NB-1.1 Food and nutrition-related knowledge deficit As related to diabetes.  As evidenced by A1C 7.8%..    Intervention:  Nutrition Nutrition counseling and diabetes education. . Plan:  Cut out fast food. Prepare meals at home and take to work. Eat baked and boiled foods. Walk 30 minutes 4 says a week Choose lower fat and  Higher fiber foods when eating out. Goal: Lose 1 lb per week til next visit. 2. Get A1C to 7% in next three months.  Handouts given during visit include: The Plate Method Food Label handouts Meal Plan Card  Monitoring/Evaluation:  Dietary intake, exercise,meal planning SBG, and body weight in 2  month(s).,

## 2014-10-15 LAB — HEMOGLOBIN A1C: A1c: 7.5

## 2014-10-22 ENCOUNTER — Encounter: Payer: Self-pay | Admitting: *Deleted

## 2014-10-22 ENCOUNTER — Ambulatory Visit: Payer: BC Managed Care – PPO | Admitting: Family Medicine

## 2014-11-13 ENCOUNTER — Other Ambulatory Visit: Payer: Self-pay | Admitting: Family Medicine

## 2014-11-19 ENCOUNTER — Other Ambulatory Visit: Payer: Self-pay | Admitting: Family Medicine

## 2014-12-11 ENCOUNTER — Encounter: Payer: Self-pay | Attending: "Endocrinology | Admitting: Nutrition

## 2014-12-11 DIAGNOSIS — E119 Type 2 diabetes mellitus without complications: Secondary | ICD-10-CM | POA: Insufficient documentation

## 2014-12-11 DIAGNOSIS — Z713 Dietary counseling and surveillance: Secondary | ICD-10-CM | POA: Insufficient documentation

## 2015-04-13 ENCOUNTER — Other Ambulatory Visit: Payer: Self-pay | Admitting: Family Medicine

## 2015-04-13 ENCOUNTER — Encounter: Payer: Self-pay | Admitting: Family Medicine

## 2015-04-13 ENCOUNTER — Ambulatory Visit (INDEPENDENT_AMBULATORY_CARE_PROVIDER_SITE_OTHER): Payer: BLUE CROSS/BLUE SHIELD | Admitting: Family Medicine

## 2015-04-13 VITALS — BP 152/84 | HR 70 | Ht 65.0 in | Wt 242.4 lb

## 2015-04-13 DIAGNOSIS — E119 Type 2 diabetes mellitus without complications: Secondary | ICD-10-CM | POA: Diagnosis not present

## 2015-04-13 DIAGNOSIS — I10 Essential (primary) hypertension: Secondary | ICD-10-CM

## 2015-04-13 DIAGNOSIS — E669 Obesity, unspecified: Secondary | ICD-10-CM

## 2015-04-13 DIAGNOSIS — Z1231 Encounter for screening mammogram for malignant neoplasm of breast: Secondary | ICD-10-CM

## 2015-04-13 DIAGNOSIS — E785 Hyperlipidemia, unspecified: Secondary | ICD-10-CM

## 2015-04-13 DIAGNOSIS — E1169 Type 2 diabetes mellitus with other specified complication: Secondary | ICD-10-CM

## 2015-04-13 MED ORDER — SPIRONOLACTONE 25 MG PO TABS: 25.0000 mg | ORAL_TABLET | Freq: Every day | ORAL | Status: DC

## 2015-04-13 MED ORDER — AMLODIPINE BESYLATE 10 MG PO TABS: 10.0000 mg | ORAL_TABLET | Freq: Every day | ORAL | Status: DC

## 2015-04-13 MED ORDER — METFORMIN HCL 500 MG PO TABS: 500.0000 mg | ORAL_TABLET | Freq: Two times a day (BID) | ORAL | Status: DC

## 2015-04-13 MED ORDER — LISINOPRIL-HYDROCHLOROTHIAZIDE 20-12.5 MG PO TABS: 2.0000 | ORAL_TABLET | Freq: Every day | ORAL | Status: DC

## 2015-04-13 MED ORDER — PRAVASTATIN SODIUM 20 MG PO TABS: 20.0000 mg | ORAL_TABLET | Freq: Every evening | ORAL | Status: DC

## 2015-04-13 NOTE — Patient Instructions (Addendum)
F/u in 3 month, caLL iF YOU NEED ME BEFORE  NURSE bp CHECK IN 4 WEEKS  You need to TAKE BP meds as directed for blood pressure control EVERY day  lABS TODAY.Lipid, cmp and EGFr, hBA1C,  Vit D, microalb  NON FASTING LABS CHEM 7 AND egfr IN 4 WEEKS  You are refeeffed for mammogram  You are referred for diabetic education  It is important that you exercise regularly at least 30 minutes 5 times a week. If you develop chest pain, have severe difficulty breathing, or feel very tired, stop exercising immediately and seek medical attention   Metformin is prescribed  Please work on good  health habits so that your health will improve. 1. Commitment to daily physical activity for 30 to 60  minutes, if you are able to do this.  2. Commitment to wise food choices. Aim for half of your  food intake to be vegetable and fruit, one quarter starchy foods, and one quarter protein. Try to eat on a regular schedule  3 meals per day, snacking between meals should be limited to vegetables or fruits or small portions of nuts. 64 ounces of water per day is generally recommended, unless you have specific health conditions, like heart failure or kidney failure where you will need to limit fluid intake.  3. Commitment to sufficient and a  good quality of physical and mental rest daily, generally between 6 to 8 hours per day.  WITH PERSISTANCE AND PERSEVERANCE, THE IMPOSSIBLE , BECOMES THE NORM! Thanks for choosing [facility], we consider it a privelige to serve you.

## 2015-04-13 NOTE — Progress Notes (Signed)
Glenda Thompson     MRN: [id number]      DOB: 07/10/1971   HPI Glenda Thompson of chronic medical conditions, medication management and review of any available recent lab and radiology data.  Preventive health is updated, specifically  Cancer screening and Immunization.   Questions or concerns regarding consultations or procedures which the PT has had in the interim are  addressed. The PT denies any adverse reactions to current medications since the last visit. Unable to afford injectable prescribed by endo and states  That she has been unable to get replacement , wants to try metformin again Denies polyuria, polydipsia, blurred vision , or hypoglycemic episodes. Not testing blood sugar regulalrlyThere are no specific complaints   ROS Denies recent fever or chills. Denies sinus pressure, nasal congestion, ear pain or sore throat. Denies chest congestion, productive cough or wheezing. Denies chest pains, palpitations and leg swelling Denies abdominal pain, nausea, vomiting,diarrhea or constipation.   Denies dysuria, frequency, hesitancy or incontinence. Denies joint pain, swelling and limitation in mobility. Denies headaches, seizures, numbness, or tingling. Denies depression, anxiety or insomnia. Denies skin break down or rash.   PE  BP 152/84 mmHg  Pulse 70  Ht 5\' 5"  (1.651 m)  Wt 242 lb 6.4 oz (109.952 kg)  BMI 40.34 kg/m2  SpO2 99%  Patient alert and oriented and in no cardiopulmonary distress.  HEENT: No facial asymmetry, EOMI,   oropharynx pink and moist.  Neck supple no JVD, no mass.  Chest: Clear to auscultation bilaterally.  CVS: S1, S2 no murmurs, no S3.Regular rate.  ABD: Soft non tender.   Ext: No edema  MS: Adequate ROM spine, shoulders, hips and knees.  Skin: Intact, no ulcerations or rash noted.  Psych: Good eye contact, normal affect. Memory intact not anxious or depressed appearing.  CNS: CN 2-12 intact,  power,  normal throughout.no focal deficits noted.   Assessment & Plan   Hypertension goal BP (blood pressure) < 130/80 Uncontrolled, non compliant with med. Nurs re eval in 4 weeks DASH diet and commitment to daily physical activity for a minimum of 30 minutes discussed and encouraged, as a part of hypertension management. The importance of attaining a healthy weight is also discussed.  BP/Weight 04/13/2015 10/10/2014 08/08/2014 06/16/2014 04/09/2014 12/09/2013 09/25/2013  Systolic BP 152 - - 130 150 160 140  Diastolic BP 84 - - 70 84 90 80  Wt. (Lbs) 242.4 243 240 238 234.12 220.4 227  BMI 40.34 40.44 39.94 39.61 38.96 36.68 37.77        Diabetes mellitus type 2 in obese Updated lab needed , past due, non compliant with treatment egime, is follwoed by endo, but has not had med prescribed due to cost, wants to try metformin again an intends to be copompliant with treatment Glenda Thompson is reminded of the importance of commitment to daily physical activity for 30 minutes or more, as able and the need to limit carbohydrate intake to 30 to 60 grams per meal to help with blood sugar control.   The need to take medication as prescribed, test blood sugar as directed, and to call between visits if there is a concern that blood sugar is uncontrolled is also discussed.   Glenda Thompson is reminded of the importance of daily foot exam, annual eye examination, and good blood sugar, blood pressure and cholesterol control.  Diabetic Labs Latest Ref Rng 10/02/2014 06/13/2014 03/26/2014 12/12/2013 09/05/2013  HbA1c - - -  6.8 - -  Microalbumin 0.00 - 1.89 mg/dL - - - - -  Micro/Creat Ratio 0.0 - 30.0 mg/g - - - - -  Chol 0 - 200 mg/dL 161 - - 096 045  HDL >40 mg/dL 46 - - 67 54  Calc LDL 0 - 99 mg/dL 99 - - 97 981(X)  Triglycerides <150 mg/dL 54 - - 55 79  Creatinine 0.50 - 1.10 mg/dL 9.14 7.82 - 9.56 2.13   BP/Weight 04/13/2015 10/10/2014 08/08/2014 06/16/2014 04/09/2014 12/09/2013 09/25/2013  Systolic  BP 152 - - 130 150 160 140  Diastolic BP 84 - - 70 84 90 80  Wt. (Lbs) 242.4 243 240 238 234.12 220.4 227  BMI 40.34 40.44 39.94 39.61 38.96 36.68 37.77   Foot/eye exam completion dates 04/13/2015 04/09/2014  Foot Form Completion Done Done       .   Morbid obesity Unchanged Patient re-educated about  the importance of commitment to a  minimum of 150 minutes of exercise per week.  The importance of healthy food choices with portion control discussed. Encouraged to start a food diary, count calories and to consider  joining a support group. Sample diet sheets offered. Goals set by the patient for the next several months.   Weight /BMI 04/13/2015 10/10/2014 08/08/2014  WEIGHT 242 lb 6.4 oz 243 lb 240 lb  HEIGHT     BMI 40.34 kg/m2 40.44 kg/m2 39.94 kg/m2    Current exercise per week 60 minutes.   Hyperlipidemia LDL goal <100 Hyperlipidemia:Low fat diet discussed and encouraged.   Lipid Panel  Lab Results  Component Value Date   CHOL 156 10/02/2014   HDL 46 10/02/2014   LDLCALC 99 10/02/2014   TRIG 54 10/02/2014   CHOLHDL 3.4 10/02/2014   Updated lab needed

## 2015-04-13 NOTE — Assessment & Plan Note (Signed)
Hyperlipidemia:Low fat diet discussed and encouraged.   Lipid Panel  Lab Results  Component Value Date   CHOL 156 10/02/2014   HDL 46 10/02/2014   LDLCALC 99 10/02/2014   TRIG 54 10/02/2014   CHOLHDL 3.4 10/02/2014   Updated lab needed

## 2015-04-13 NOTE — Assessment & Plan Note (Signed)
Uncontrolled, non compliant with med. Nurs re eval in 4 weeks DASH diet and commitment to daily physical activity for a minimum of 30 minutes discussed and encouraged, as a part of hypertension management. The importance of attaining a healthy weight is also discussed.  BP/Weight 04/13/2015 10/10/2014 08/08/2014 06/16/2014 04/09/2014 12/09/2013 09/25/2013  Systolic BP 152 - - 130 150 160 140  Diastolic BP 84 - - 70 84 90 80  Wt. (Lbs) 242.4 243 240 238 234.12 220.4 227  BMI 40.34 40.44 39.94 39.61 38.96 36.68 37.77

## 2015-04-13 NOTE — Assessment & Plan Note (Signed)
Unchanged Patient re-educated about  the importance of commitment to a  minimum of 150 minutes of exercise per week.  The importance of healthy food choices with portion control discussed. Encouraged to start a food diary, count calories and to consider  joining a support group. Sample diet sheets offered. Goals set by the patient for the next several months.   Weight /BMI 04/13/2015 10/10/2014 08/08/2014  WEIGHT 242 lb 6.4 oz 243 lb 240 lb  HEIGHT 5\' 5"  5\' 5"  5\' 5"   BMI 40.34 kg/m2 40.44 kg/m2 39.94 kg/m2    Current exercise per week 60 minutes.

## 2015-04-13 NOTE — Assessment & Plan Note (Signed)
Updated lab needed , past due, non compliant with treatment egime, is follwoed by endo, but has not had med prescribed due to cost, wants to try metformin again an intends to be copompliant with treatment [name] is reminded of the importance of commitment to daily physical activity for 30 minutes or more, as able and the need to limit carbohydrate intake to 30 to 60 grams per meal to help with blood sugar control.   The need to take medication as prescribed, test blood sugar as directed, and to call between visits if there is a concern that blood sugar is uncontrolled is also discussed.   [name] is reminded of the importance of daily foot exam, annual eye examination, and good blood sugar, blood pressure and cholesterol control.  Diabetic Labs Latest Ref Rng 10/02/2014 06/13/2014 03/26/2014 12/12/2013 09/05/2013  HbA1c - - - 6.8 - -  Microalbumin 0.00 - 1.89 mg/dL - - - - -  Micro/Creat Ratio 0.0 - 30.0 mg/g - - - - -  Chol 0 - 200 mg/dL 142 - - 395 320  HDL >23 mg/dL 46 - - 67 54  Calc LDL 0 - 99 mg/dL 99 - - 97 343(H)  Triglycerides <150 mg/dL 54 - - 55 79  Creatinine 0.50 - 1.10 mg/dL 6.86 1.68 - 3.72 9.02   BP/Weight 04/13/2015 10/10/2014 08/08/2014 06/16/2014 04/09/2014 12/09/2013 09/25/2013  Systolic BP 152 - - 130 150 160 140  Diastolic BP 84 - - 70 84 90 80  Wt. (Lbs) 242.4 243 240 238 234.12 220.4 227  BMI 40.34 40.44 39.94 39.61 38.96 36.68 37.77   Foot/eye exam completion dates 04/13/2015 04/09/2014  Foot Form Completion Done Done       .

## 2015-04-14 LAB — MICROALBUMIN / CREATININE URINE RATIO: Creatinine, Urine: 46.5 mg/dL

## 2015-04-20 ENCOUNTER — Ambulatory Visit (HOSPITAL_COMMUNITY)
Admission: RE | Admit: 2015-04-20 | Discharge: 2015-04-20 | Disposition: A | Discharge status: Home / Self Care | Payer: BLUE CROSS/BLUE SHIELD | Source: Ambulatory Visit | Attending: Family Medicine | Admitting: Family Medicine

## 2015-04-20 DIAGNOSIS — Z1231 Encounter for screening mammogram for malignant neoplasm of breast: Secondary | ICD-10-CM | POA: Insufficient documentation

## 2015-05-11 ENCOUNTER — Ambulatory Visit: Payer: BLUE CROSS/BLUE SHIELD

## 2015-05-11 VITALS — BP 134/82 | Wt 248.0 lb

## 2015-05-11 DIAGNOSIS — I10 Essential (primary) hypertension: Secondary | ICD-10-CM

## 2015-05-11 NOTE — Progress Notes (Signed)
Patient reports taking all prescribed meds daily as directed and her BP has improved. Continue same meds and follow up at next appt

## 2015-06-05 NOTE — Addendum Note (Signed)
Addended by: Kandis Fantasia B on: 06/05/2015 01:24 PM   Modules accepted: Orders

## 2015-07-16 ENCOUNTER — Ambulatory Visit: Payer: BLUE CROSS/BLUE SHIELD | Admitting: Family Medicine

## 2015-11-30 ENCOUNTER — Emergency Department (HOSPITAL_COMMUNITY)
Admission: EM | Admit: 2015-11-30 | Discharge: 2015-11-30 | Disposition: A | Discharge status: Home / Self Care | Payer: BLUE CROSS/BLUE SHIELD | Attending: Emergency Medicine | Admitting: Emergency Medicine

## 2015-11-30 ENCOUNTER — Encounter (HOSPITAL_COMMUNITY): Payer: Self-pay | Admitting: *Deleted

## 2015-11-30 ENCOUNTER — Telehealth: Payer: Self-pay | Admitting: Family Medicine

## 2015-11-30 DIAGNOSIS — Z79899 Other long term (current) drug therapy: Secondary | ICD-10-CM | POA: Diagnosis not present

## 2015-11-30 DIAGNOSIS — E1165 Type 2 diabetes mellitus with hyperglycemia: Secondary | ICD-10-CM | POA: Diagnosis not present

## 2015-11-30 DIAGNOSIS — Z7951 Long term (current) use of inhaled steroids: Secondary | ICD-10-CM | POA: Insufficient documentation

## 2015-11-30 DIAGNOSIS — J111 Influenza due to unidentified influenza virus with other respiratory manifestations: Secondary | ICD-10-CM | POA: Diagnosis not present

## 2015-11-30 DIAGNOSIS — Z7984 Long term (current) use of oral hypoglycemic drugs: Secondary | ICD-10-CM | POA: Diagnosis not present

## 2015-11-30 DIAGNOSIS — J029 Acute pharyngitis, unspecified: Secondary | ICD-10-CM | POA: Diagnosis present

## 2015-11-30 DIAGNOSIS — R69 Illness, unspecified: Secondary | ICD-10-CM

## 2015-11-30 DIAGNOSIS — I10 Essential (primary) hypertension: Secondary | ICD-10-CM | POA: Insufficient documentation

## 2015-11-30 LAB — CBG MONITORING, ED: GLUCOSE-CAPILLARY: 284 mg/dL — AB (ref 65–99)

## 2015-11-30 MED ORDER — ACETAMINOPHEN 500 MG PO TABS
1000.0000 mg | ORAL_TABLET | Freq: Once | ORAL | Status: AC
  Administered 2015-11-30: 1000 mg via ORAL
  Filled 2015-11-30: qty 2

## 2015-11-30 NOTE — Discharge Instructions (Signed)
Influenza, Adult Make sure that you drink at least six 8 ounce glasses of water each day. Take Tylenol as directed for aches. Your blood sugar is high today at 284. Take your medications as directed. Take your blood pressure medication when you get home upon arrival. Call Dr. Lodema Hong to schedule appointment if not feeling better in 3 days. Return if you feel worse for any reason.Your blood pressure should be rechecked within the next 1 or 2 weeks at Dr. Anthony Sar office. Today's is  elevated at 181/67 Influenza (flu) is an infection in the mouth, nose, and throat (respiratory tract) caused by a virus. The flu can make you feel very ill. Influenza spreads easily from person to person (contagious).  HOME CARE   Only take medicines as told by your doctor.  Use a cool mist humidifier to make breathing easier.  Get plenty of rest until your fever goes away. This usually takes 3 to 4 days.  Drink enough fluids to keep your pee (urine) clear or pale yellow.  Cover your mouth and nose when you cough or sneeze.  Wash your hands well to avoid spreading the flu.  Stay home from work or school until your fever has been gone for at least 1 full day.  Get a flu shot every year. GET HELP RIGHT AWAY IF:   You have trouble breathing or feel short of breath.  Your skin or nails turn blue.  You have severe neck pain or stiffness.  You have a severe headache, facial pain, or earache.  Your fever gets worse or keeps coming back.  You feel sick to your stomach (nauseous), throw up (vomit), or have watery poop (diarrhea).  You have chest pain.  You have a deep cough that gets worse, or you cough up more thick spit (mucus). MAKE SURE YOU:   Understand these instructions.  Will watch your condition.  Will get help right away if you are not doing well or get worse.   This information is not intended to replace advice given to you by your health care provider. Make sure you discuss any questions  you have with your health care provider.   Document Released: 07/19/2008 Document Revised: 10/31/2014 Document Reviewed: 01/09/2012 Elsevier Interactive Patient Education [employer]

## 2015-11-30 NOTE — Telephone Encounter (Signed)
Patient is in the ER now being examined

## 2015-11-30 NOTE — Telephone Encounter (Signed)
Patient is aching all over, throat sore, head ache, symptoms started yesterday, having chills, ? Fever, please advise any add ons?

## 2015-11-30 NOTE — ED Provider Notes (Addendum)
CSN: [id number]     Arrival date & time 11/30/15  3086 History  By signing my name below, I, Murriel Hopper, attest that this documentation has been prepared under the direction and in the presence of Doug Sou, MD. Electronically Signed: Murriel Hopper, ED Scribe. 11/30/2015. 9:00 AM.    Chief Complaint  Patient presents with  . Generalized Body Aches      Patient is a 45 y.o. female presenting with pharyngitis. The history is provided by the patient. No language interpreter was used.  Sore Throat This is a new problem. The current episode started yesterday. The problem occurs constantly. The problem has been gradually worsening. Nothing aggravates the symptoms. Nothing relieves the symptoms.    HPI Comments: Glenda Thompson is a 45 y.o. female with a PMHx of HTN, DM and a PSHx of Cesarean Section who presents to the Emergency Department complaining of constant, worsening generalized body aches that have been present for two days. Pt reports her body achest began to worsen yesterday, and states she had to leave work because of it. Pt also reports associated sore throat, headache,, intermittent subjective fever, and coughing that has been present since yesterday. Denies shortness of breath denies lightheadedness with standing and no abdominal pain no shortness of breath. Sore throat is worse with swallowing. Not improved by anything. Pt states she took Robitussin and Mucinex for her symptoms with no relief. Pt denies getting a flu shot this year. Pt denies taking her blood pressure medication this morning, and reports the last time she checked her blood sugar was two days ago, and states it was 127. Pt denies smoking, drinking, or doing any illegal drugs. Pt denies any allergies to medications. Her husband states he is driving home. Pt denies vomiting.     Past Medical History  Diagnosis Date  . Hypertension   . Diabetes mellitus    adult onset Past Surgical History  Procedure Laterality  Date  . Cesarean section     Family History  Problem Relation Age of Onset  . Hypertension Mother   . Diabetes Mother   . Diabetes Brother    Social History  Substance Use Topics  . Smoking status: Never Smoker   . Smokeless tobacco: Not on file  . Alcohol Use: Not on file   OB History    No data available     Review of Systems  HENT: Positive for sore throat.   Respiratory: Positive for cough.   Musculoskeletal: Positive for myalgias.  Allergic/Immunologic: Positive for immunocompromised state.       Diabetic  All other systems reviewed and are negative.     Allergies  Review of patient's allergies indicates no known allergies.  Home Medications   Prior to Admission medications   Medication Sig Start Date End Date Taking? Authorizing Provider  amLODipine (NORVASC) 10 MG tablet Take 1 tablet (10 mg total) by mouth daily. 04/13/15   Kerri Perches, MD  dapagliflozin propanediol (FARXIGA) 10 MG TABS tablet Take 10 mg by mouth daily. 04/13/15   Kerri Perches, MD  Dapagliflozin Propanediol (FARXIGA) 5 MG TABS Take 1 tablet by mouth daily.    Historical Provider, MD  ergocalciferol (VITAMIN D2) 50000 UNITS capsule Take 1 capsule (50,000 Units total) by mouth once a week. One capsule once weekly 09/25/13   Kerri Perches, MD  fluticasone [facility]) 50 MCG/ACT nasal spray Place 1 spray into both nostrils daily. 09/12/13 04/12/16  Kerri Perches, MD  glucose blood (ACCU-CHEK  COMPACT STRIPS) test strip Use as instructed three times daily For accuchek compact meter 12/17/12   Kerri Perches, MD  lisinopril-hydrochlorothiazide (PRINZIDE,ZESTORETIC) 20-12.5 MG per tablet Take 2 tablets by mouth daily. 04/13/15   Kerri Perches, MD  loratadine (CLARITIN) 10 MG tablet Take 1 tablet (10 mg total) by mouth daily. 05/16/13 04/12/16  Kerri Perches, MD  metFORMIN (GLUCOPHAGE) 500 MG tablet Take 1 tablet (500 mg total) by mouth 2 (two) times daily with a meal. 04/13/15    Kerri Perches, MD  pravastatin (PRAVACHOL) 20 MG tablet Take 1 tablet (20 mg total) by mouth every evening. 04/13/15   Kerri Perches, MD  spironolactone (ALDACTONE) 25 MG tablet Take 1 tablet (25 mg total) by mouth daily. 04/13/15   Kerri Perches, MD   There were no vitals taken for this visit. Physical Exam  Constitutional: She is oriented to person, place, and time. She appears well-developed and well-nourished.  Allergic [place] coma score 15 nontoxic-appearing  HENT:  Head: Normocephalic and atraumatic.  Eyes: Conjunctivae are normal. Pupils are equal, round, and reactive to light.  Neck: Neck supple. No tracheal deviation present. No thyromegaly present.  Cardiovascular: Normal rate and regular rhythm.   No murmur heard. Pulmonary/Chest: Effort normal and breath sounds normal.  Abdominal: Soft. Bowel sounds are normal. She exhibits no distension. There is no tenderness.  Obese  Musculoskeletal: Normal range of motion. She exhibits no edema or tenderness.  Lymphadenopathy:    She has no cervical adenopathy.  Neurological: She is alert and oriented to person, place, and time. Coordination normal.  Skin: Skin is warm and dry. No rash noted.  Psychiatric: She has a normal mood and affect.  Nursing note and vitals reviewed.   ED Course  Procedures (including critical care time)  DIAGNOSTIC STUDIES: Oxygen Saturation is 98% on room air, normal by my interpretation.    COORDINATION OF CARE: 9:00 AM Discussed treatment plan with pt at bedside and pt agreed to plan.   Labs Review Labs Reviewed - No data to display  Imaging Review No results found. I have personally reviewed and evaluated these images and lab results as part of my medical decision-making.   EKG Interpretation None     Treated with Tylenol in the ED. At 9:20 AM she feels well to home. She is in agreement with plan. Results for orders placed or performed during the hospital encounter of 11/30/15   CBG monitoring, ED  Result Value Ref Range   Glucose-Capillary 284 (H) 65 - 99 mg/dL   No results found.  MDM  Patient with influenza-like illness. Plan Tylenol. Encourage oral hydration. She is not taking her blood pressure medication yet today. She's encouraged to follow-up with Dr. Lodema Hong if not feeling better in 3 days. Return if condition worsens  Final diagnoses:  None   diagnosis #1 influenza-like illness #2 hyperglycemia #3 elevated blood pressure        Doug Sou, MD 11/30/15 5621  Doug Sou, MD 11/30/15 [phone]

## 2015-11-30 NOTE — Telephone Encounter (Signed)
Come today asap

## 2015-11-30 NOTE — ED Notes (Signed)
Pt. C/o body aches and sore throat since Saturday. Unsure of fever. Denies n/v/d.

## 2015-12-01 ENCOUNTER — Telehealth: Payer: Self-pay | Admitting: Family Medicine

## 2015-12-01 ENCOUNTER — Ambulatory Visit (HOSPITAL_COMMUNITY)
Admission: RE | Admit: 2015-12-01 | Discharge: 2015-12-01 | Disposition: A | Discharge status: Home / Self Care | Payer: BLUE CROSS/BLUE SHIELD | Source: Ambulatory Visit | Attending: Family Medicine | Admitting: Family Medicine

## 2015-12-01 ENCOUNTER — Ambulatory Visit (INDEPENDENT_AMBULATORY_CARE_PROVIDER_SITE_OTHER): Payer: BLUE CROSS/BLUE SHIELD | Admitting: Family Medicine

## 2015-12-01 ENCOUNTER — Encounter: Payer: Self-pay | Admitting: Family Medicine

## 2015-12-01 VITALS — BP 154/82 | HR 102 | Resp 16 | Ht 65.0 in | Wt 255.0 lb

## 2015-12-01 DIAGNOSIS — Z1159 Encounter for screening for other viral diseases: Secondary | ICD-10-CM

## 2015-12-01 DIAGNOSIS — J0100 Acute maxillary sinusitis, unspecified: Secondary | ICD-10-CM | POA: Diagnosis not present

## 2015-12-01 DIAGNOSIS — E1169 Type 2 diabetes mellitus with other specified complication: Secondary | ICD-10-CM

## 2015-12-01 DIAGNOSIS — E785 Hyperlipidemia, unspecified: Secondary | ICD-10-CM

## 2015-12-01 DIAGNOSIS — J209 Acute bronchitis, unspecified: Secondary | ICD-10-CM

## 2015-12-01 DIAGNOSIS — I10 Essential (primary) hypertension: Secondary | ICD-10-CM

## 2015-12-01 DIAGNOSIS — J029 Acute pharyngitis, unspecified: Secondary | ICD-10-CM | POA: Diagnosis not present

## 2015-12-01 DIAGNOSIS — E669 Obesity, unspecified: Secondary | ICD-10-CM

## 2015-12-01 DIAGNOSIS — J019 Acute sinusitis, unspecified: Secondary | ICD-10-CM | POA: Insufficient documentation

## 2015-12-01 DIAGNOSIS — E119 Type 2 diabetes mellitus without complications: Secondary | ICD-10-CM

## 2015-12-01 LAB — CBC WITH DIFFERENTIAL/PLATELET
BASOS ABS: 0 10*3/uL (ref 0.0–0.1)
BASOS PCT: 0 % (ref 0–1)
EOS ABS: 0.1 10*3/uL (ref 0.0–0.7)
EOS PCT: 1 % (ref 0–5)
HCT: 42.4 % (ref 36.0–46.0)
Hemoglobin: 14 g/dL (ref 12.0–15.0)
LYMPHS ABS: 2.1 10*3/uL (ref 0.7–4.0)
Lymphocytes Relative: 36 % (ref 12–46)
MCH: 27.4 pg (ref 26.0–34.0)
MCHC: 33 g/dL (ref 30.0–36.0)
MCV: 83 fL (ref 78.0–100.0)
MPV: 10.3 fL (ref 8.6–12.4)
Monocytes Absolute: 0.8 10*3/uL (ref 0.1–1.0)
Monocytes Relative: 14 % — ABNORMAL HIGH (ref 3–12)
Neutro Abs: 2.8 10*3/uL (ref 1.7–7.7)
Neutrophils Relative %: 49 % (ref 43–77)
PLATELETS: 252 10*3/uL (ref 150–400)
RBC: 5.11 MIL/uL (ref 3.87–5.11)
RDW: 13.3 % (ref 11.5–15.5)
WBC: 5.7 10*3/uL (ref 4.0–10.5)

## 2015-12-01 LAB — POCT RAPID STREP A (OFFICE): RAPID STREP A SCREEN: NEGATIVE

## 2015-12-01 MED ORDER — AZITHROMYCIN 250 MG PO TABS: ORAL_TABLET | ORAL | Status: DC

## 2015-12-01 NOTE — Assessment & Plan Note (Signed)
Uncontrolled, not taking med regularly, states intolerant Glenda Thompson is reminded of the importance of commitment to daily physical activity for 30 minutes or more, as able and the need to limit carbohydrate intake to 30 to 60 grams per meal to help with blood sugar control.   The need to take medication as prescribed, test blood sugar as directed, and to call between visits if there is a concern that blood sugar is uncontrolled is also discussed.   Glenda Thompson is reminded of the importance of daily foot exam, annual eye examination, and good blood sugar, blood pressure and cholesterol control.  Diabetic Labs Latest Ref Rng 04/13/2015 10/02/2014 06/13/2014 03/26/2014 12/12/2013  HbA1c - - - - 6.8 -  Microalbumin <2.0 mg/dL <1.6 - - - -  Micro/Creat Ratio 0.0 - 30.0 mg/g SEE NOTE - - - -  Chol 0 - 200 mg/dL - 109 - - 604  HDL >54 mg/dL - 46 - - 67  Calc LDL 0 - 99 mg/dL - 99 - - 97  Triglycerides <150 mg/dL - 54 - - 55  Creatinine 0.50 - 1.10 mg/dL - 0.98 1.19 - 1.47   BP/Weight 12/01/2015 11/30/2015 05/11/2015 04/13/2015 10/10/2014 08/08/2014 06/16/2014  Systolic BP 154 181 134 152 - - 130  Diastolic BP 82 67 82 84 - - 70  Wt. (Lbs) 255 250 248 242.4 243 240 238  BMI 42.43 41.6 41.27 40.34 40.44 39.94 39.61   Foot/eye exam completion dates 04/13/2015 04/09/2014  Foot Form Completion Done Done    Updated lab today

## 2015-12-01 NOTE — Addendum Note (Signed)
Addended by: Abner Greenspan on: 12/01/2015 12:58 PM   Modules accepted: Orders

## 2015-12-01 NOTE — Assessment & Plan Note (Signed)
Elevated at visit , however has been  On decongestants for respiratory illness will re assess on her return visit DASH diet and commitment to daily physical activity for a minimum of 30 minutes discussed and encouraged, as a part of hypertension management. The importance of attaining a healthy weight is also discussed.  BP/Weight 12/01/2015 11/30/2015 05/11/2015 04/13/2015 10/10/2014 08/08/2014 06/16/2014  Systolic BP 154 181 134 152 - - 130  Diastolic BP 82 67 82 84 - - 70  Wt. (Lbs) 255 250 248 242.4 243 240 238  BMI 42.43 41.6 41.27 40.34 40.44 39.94 39.61

## 2015-12-01 NOTE — Assessment & Plan Note (Signed)
CXR today and z pack prescribed, has OTC decongestant

## 2015-12-01 NOTE — Telephone Encounter (Signed)
In the ER yesterday, she states that she was diagnosed with the flu and her Blood Sugar and Blood Pressure was high due to over the counter medication, she is asking to follow up here in the office, please advise?

## 2015-12-01 NOTE — Telephone Encounter (Signed)
Work in today/ in am please, I had asked multiple labs be ordered also

## 2015-12-01 NOTE — Patient Instructions (Signed)
F/u in 6 weeks  With diabetic log book and meter  Blood pressure is high today , no med change at thsi time due to decongestants you are usinng  You are treated for acute viral illness , sinusitis and bronchitis.  CXR today and labs, CBC and diff , cmp and EGFr, HBA1C and HIV  Antibiotic is sent to your pharmacy  Use tylenol for relief of pain and fever and get a lot of fluids and rest  You should be able to return to work on this Thursday, call if you feel unable   SPEAK with nurse tomorrow about labs today so I can start your diabetes medications , lantus and farxiga  TEST and record 2 to 3 times daily    Goal for fasting blood sugar ranges from 80 to 120 and 2 hours after any meal or at bedtime should be between 130 to 170.

## 2015-12-01 NOTE — Progress Notes (Signed)
Subjective:    Patient ID: Glenda Thompson, female    DOB: January 31, 1971, 45 y.o.   MRN: [id number]  HPI 4 day h/o worsening head and chest congestion, associated with fever and chills intermittently. Nasal drainage has thickened , and is yellowish green, and at times bloody.Bilateral ear pain and pressure Scant sputum, but is yellow when able and pain with coughing and breathing deeplylow. C/o bilateral ear pressure, denies hearing loss and sore throat. Increasing fatigue , poor appetitie and sleep disturbed by cough. No improvement with OTC medication. Blood sugar high, and not testing regularly, unable to tolerate metformin , wants to resume insulin    Review of Systems See HPI  Denies chest pains, palpitations and leg swelling Denies abdominal pain, nausea, vomiting,diarrhea or constipation.   Denies dysuria, frequency, hesitancy or incontinence. Denies joint pain, swelling and limitation in mobility. Denies headaches, seizures, numbness, or tingling. Denies depression, anxiety or insomnia. Denies skin break down or rash.        Objective:   Physical Exam BP 154/82 mmHg  Pulse 102  Resp 16  Ht  (1.651 m)  Wt 255 lb (115.667 kg)  BMI 42.43 kg/m2  SpO2 97%  LMP 11/23/2015  Patient alert and oriented and in no cardiopulmonary distress.Ill appearing  HEENT: No facial asymmetry, EOMI,   oropharynx pink and moist.  Neck supple no JVD,bilateral anterior cervical adenitis , maxillary sinus tenderness, right more than left. Nasal mucosa erythematous and edematous  Chest: Deceased air entry in left lower zone with bronchial BS CVS: S1, S2 no murmurs, no S3.Regular rate.  ABD: Soft non tender.   Ext: No edema  MS: Adequate ROM spine, shoulders, hips and knees.  Skin: Intact, no ulcerations or rash noted.  Psych: Good eye contact, normal affect. Memory intact not anxious or depressed appearing.  CNS: CN 2-12 intact, power,  normal throughout.no focal deficits  noted.        Assessment & Plan:  Hypertension goal BP (blood pressure) < 130/80 Elevated at visit , however has been  On decongestants for respiratory illness will re assess on her return visit DASH diet and commitment to daily physical activity for a minimum of 30 minutes discussed and encouraged, as a part of hypertension management. The importance of attaining a healthy weight is also discussed.  BP/Weight 12/01/2015 11/30/2015 05/11/2015 04/13/2015 10/10/2014 08/08/2014 06/16/2014  Systolic BP 154 181 134 152 - - 130  Diastolic BP 82 67 82 84 - - 70  Wt. (Lbs) 255 250 248 242.4 243 240 238  BMI 42.43 41.6 41.27 40.34 40.44 39.94 39.61        Acute bronchitis CXR today and z pack prescribed, has OTC decongestant  Sinusitis, acute Maxillary sinus tenderness , right primarily with chills and fever , z pack prescribed    Hyperlipidemia LDL goal <100 Updated lab needed at/ before next visit. Hyperlipidemia:Low fat diet discussed and encouraged.   Lipid Panel  Lab Results  Component Value Date   CHOL 156 10/02/2014   HDL 46 10/02/2014   LDLCALC 99 10/02/2014   TRIG 54 10/02/2014   CHOLHDL 3.4 10/02/2014        Morbid obesity Deteriorated. Patient re-educated about  the importance of commitment to a  minimum of 150 minutes of exercise per week.  The importance of healthy food choices with portion control discussed. Encouraged to start a food diary, count calories and to consider  joining a support group. Sample diet sheets offered. Goals set by  the patient for the next several months.   Weight /BMI 12/01/2015 11/30/2015 05/11/2015  WEIGHT 255 lb 250 lb 248 lb  HEIGHT   -  BMI 42.43 kg/m2 41.6 kg/m2 41.27 kg/m2      Diabetes mellitus type 2 in obese Uncontrolled, not taking med regularly, states intolerant [name] is reminded of the importance of commitment to daily physical activity for 30 minutes or more, as able and the need to limit carbohydrate  intake to 30 to 60 grams per meal to help with blood sugar control.   The need to take medication as prescribed, test blood sugar as directed, and to call between visits if there is a concern that blood sugar is uncontrolled is also discussed.   [name] is reminded of the importance of daily foot exam, annual eye examination, and good blood sugar, blood pressure and cholesterol control.  Diabetic Labs Latest Ref Rng 04/13/2015 10/02/2014 06/13/2014 03/26/2014 12/12/2013  HbA1c - - - - 6.8 -  Microalbumin <2.0 mg/dL <1.6 - - - -  Micro/Creat Ratio 0.0 - 30.0 mg/g SEE NOTE - - - -  Chol 0 - 200 mg/dL - 109 - - 604  HDL >54 mg/dL - 46 - - 67  Calc LDL 0 - 99 mg/dL - 99 - - 97  Triglycerides <150 mg/dL - 54 - - 55  Creatinine 0.50 - 1.10 mg/dL - 0.98 1.19 - 1.47   BP/Weight 12/01/2015 11/30/2015 05/11/2015 04/13/2015 10/10/2014 08/08/2014 06/16/2014  Systolic BP 154 181 134 152 - - 130  Diastolic BP 82 67 82 84 - - 70  Wt. (Lbs) 255 250 248 242.4 243 240 238  BMI 42.43 41.6 41.27 40.34 40.44 39.94 39.61   Foot/eye exam completion dates 04/13/2015 04/09/2014  Foot Form Completion Done Done    Updated lab today

## 2015-12-01 NOTE — Telephone Encounter (Signed)
Patient is on the schedule at 10:30

## 2015-12-01 NOTE — Assessment & Plan Note (Signed)
Deteriorated. Patient re-educated about  the importance of commitment to a  minimum of 150 minutes of exercise per week.  The importance of healthy food choices with portion control discussed. Encouraged to start a food diary, count calories and to consider  joining a support group. Sample diet sheets offered. Goals set by the patient for the next several months.   Weight /BMI 12/01/2015 11/30/2015 05/11/2015  WEIGHT 255 lb 250 lb 248 lb  HEIGHT   -  BMI 42.43 kg/m2 41.6 kg/m2 41.27 kg/m2

## 2015-12-01 NOTE — Assessment & Plan Note (Signed)
Updated lab needed at/ before next visit. Hyperlipidemia:Low fat diet discussed and encouraged.   Lipid Panel  Lab Results  Component Value Date   CHOL 156 10/02/2014   HDL 46 10/02/2014   LDLCALC 99 10/02/2014   TRIG 54 10/02/2014   CHOLHDL 3.4 10/02/2014

## 2015-12-01 NOTE — Assessment & Plan Note (Signed)
Maxillary sinus tenderness , right primarily with chills and fever , z pack prescribed

## 2015-12-02 ENCOUNTER — Other Ambulatory Visit: Payer: Self-pay | Admitting: Family Medicine

## 2015-12-02 LAB — COMPLETE METABOLIC PANEL WITH GFR
ALT: 37 U/L — ABNORMAL HIGH (ref 6–29)
AST: 30 U/L (ref 10–35)
Albumin: 4.1 g/dL (ref 3.6–5.1)
Alkaline Phosphatase: 57 U/L (ref 33–115)
BUN: 13 mg/dL (ref 7–25)
CHLORIDE: 97 mmol/L — AB (ref 98–110)
CO2: 27 mmol/L (ref 20–31)
Calcium: 9.1 mg/dL (ref 8.6–10.2)
Creat: 0.86 mg/dL (ref 0.50–1.10)
GFR, EST NON AFRICAN AMERICAN: 82 mL/min (ref >=60)
Glucose, Bld: 227 mg/dL — ABNORMAL HIGH (ref 65–99)
Potassium: 4.3 mmol/L (ref 3.5–5.3)
SODIUM: 134 mmol/L — AB (ref 135–146)
Total Bilirubin: 0.4 mg/dL (ref 0.2–1.2)
Total Protein: 7.1 g/dL (ref 6.1–8.1)

## 2015-12-02 LAB — HEMOGLOBIN A1C
Hgb A1c MFr Bld: 10 % — ABNORMAL HIGH (ref <5.7)
MEAN PLASMA GLUCOSE: 240 mg/dL — AB (ref <117)

## 2015-12-02 LAB — HIV ANTIBODY (ROUTINE TESTING W REFLEX): HIV: NONREACTIVE

## 2015-12-02 MED ORDER — DAPAGLIFLOZIN PROPANEDIOL 10 MG PO TABS: 10.0000 mg | ORAL_TABLET | Freq: Every day | ORAL | Status: DC

## 2015-12-02 MED ORDER — INSULIN DETEMIR 100 UNIT/ML FLEXPEN: 35.0000 [IU] | PEN_INJECTOR | Freq: Every day | SUBCUTANEOUS | Status: DC

## 2015-12-03 ENCOUNTER — Telehealth: Payer: Self-pay | Admitting: Family Medicine

## 2015-12-03 MED ORDER — INSULIN PEN NEEDLE 31G X 8 MM MISC: Status: DC

## 2015-12-03 NOTE — Telephone Encounter (Signed)
Glenda Thompson is calling stating that the Rx for her disposable needles wasn't called in for her pins, please advise?

## 2015-12-03 NOTE — Telephone Encounter (Signed)
OK to take her out an additional day, (in light of uncontrolled blood sugar) ensure she has supplies

## 2015-12-03 NOTE — Telephone Encounter (Signed)
Pt aware and will come collect

## 2015-12-03 NOTE — Telephone Encounter (Signed)
Ok to write work note for today

## 2015-12-03 NOTE — Telephone Encounter (Signed)
Patient stated that Dr. Lodema Hong said that if she didn't feel any better today she would give her a work note for today going back tomorrow, please advise?

## 2015-12-03 NOTE — Telephone Encounter (Signed)
Called in.

## 2015-12-07 DIAGNOSIS — Z0289 Encounter for other administrative examinations: Secondary | ICD-10-CM

## 2015-12-28 ENCOUNTER — Other Ambulatory Visit: Payer: Self-pay | Admitting: Family Medicine

## 2016-01-15 ENCOUNTER — Other Ambulatory Visit: Payer: Self-pay | Admitting: Family Medicine

## 2016-03-08 ENCOUNTER — Other Ambulatory Visit: Payer: Self-pay | Admitting: Family Medicine

## 2016-03-08 ENCOUNTER — Telehealth: Payer: Self-pay | Admitting: Family Medicine

## 2016-03-08 DIAGNOSIS — R197 Diarrhea, unspecified: Secondary | ICD-10-CM

## 2016-03-08 MED ORDER — ONDANSETRON 4 MG PO TBDP: 4.0000 mg | ORAL_TABLET | Freq: Three times a day (TID) | ORAL | Status: DC | PRN

## 2016-03-08 MED ORDER — DIPHENOXYLATE-ATROPINE 2.5-0.025 MG PO TABS: ORAL_TABLET | ORAL | Status: DC

## 2016-03-08 NOTE — Telephone Encounter (Signed)
Patient aware.  Medication sent to pharmacy.  Patient will have labs done in the am

## 2016-03-08 NOTE — Telephone Encounter (Signed)
Patient is calling asking to be seen today, complaining of chills, loss of appetite headache, body ache, symptoms x's 2 days

## 2016-03-08 NOTE — Addendum Note (Signed)
Addended by: Kandis FantasiaSLADE, Terianna Peggs B on: 03/08/2016 04:52 PM   Modules accepted: Orders

## 2016-03-08 NOTE — Telephone Encounter (Signed)
I will send  In zofran and lomotil, pls order cbc and diff and cmp for her to get today, not stat, she will be seen in am, o0K to see today if she wants

## 2016-03-08 NOTE — Telephone Encounter (Signed)
Patient also complains of diarrhea and nausea .

## 2016-03-09 ENCOUNTER — Other Ambulatory Visit: Payer: Self-pay | Admitting: Family Medicine

## 2016-03-09 LAB — CBC WITH DIFFERENTIAL/PLATELET
BASOS ABS: 0 {cells}/uL (ref 0–200)
BASOS PCT: 0 %
EOS ABS: 0 {cells}/uL — AB (ref 15–500)
Eosinophils Relative: 0 %
HCT: 38.7 % (ref 35.0–45.0)
HEMOGLOBIN: 12.9 g/dL (ref 11.7–15.5)
LYMPHS ABS: 930 {cells}/uL (ref 850–3900)
Lymphocytes Relative: 10 %
MCH: 28 pg (ref 27.0–33.0)
MCHC: 33.3 g/dL (ref 32.0–36.0)
MCV: 83.9 fL (ref 80.0–100.0)
MONO ABS: 465 {cells}/uL (ref 200–950)
MONOS PCT: 5 %
MPV: 9.8 fL (ref 7.5–12.5)
NEUTROS ABS: 7905 {cells}/uL — AB (ref 1500–7800)
Neutrophils Relative %: 85 %
PLATELETS: 237 10*3/uL (ref 140–400)
RBC: 4.61 MIL/uL (ref 3.80–5.10)
RDW: 13.9 % (ref 11.0–15.0)
WBC: 9.3 10*3/uL (ref 3.8–10.8)

## 2016-03-09 LAB — COMPREHENSIVE METABOLIC PANEL
ALBUMIN: 4 g/dL (ref 3.6–5.1)
ALK PHOS: 44 U/L (ref 33–115)
ALT: 12 U/L (ref 6–29)
AST: 15 U/L (ref 10–35)
BUN: 13 mg/dL (ref 7–25)
CO2: 24 mmol/L (ref 20–31)
CREATININE: 1.06 mg/dL (ref 0.50–1.10)
Calcium: 9.2 mg/dL (ref 8.6–10.2)
Chloride: 102 mmol/L (ref 98–110)
Glucose, Bld: 104 mg/dL — ABNORMAL HIGH (ref 65–99)
POTASSIUM: 3.9 mmol/L (ref 3.5–5.3)
Sodium: 136 mmol/L (ref 135–146)
TOTAL PROTEIN: 6.8 g/dL (ref 6.1–8.1)
Total Bilirubin: 0.4 mg/dL (ref 0.2–1.2)

## 2016-03-10 ENCOUNTER — Ambulatory Visit (INDEPENDENT_AMBULATORY_CARE_PROVIDER_SITE_OTHER): Payer: BLUE CROSS/BLUE SHIELD | Admitting: Family Medicine

## 2016-03-10 ENCOUNTER — Encounter: Payer: Self-pay | Admitting: Family Medicine

## 2016-03-10 VITALS — BP 138/84 | HR 94 | Temp 99.3°F | Resp 16 | Ht 65.0 in | Wt 247.0 lb

## 2016-03-10 DIAGNOSIS — A09 Infectious gastroenteritis and colitis, unspecified: Secondary | ICD-10-CM | POA: Diagnosis not present

## 2016-03-10 DIAGNOSIS — I10 Essential (primary) hypertension: Secondary | ICD-10-CM | POA: Diagnosis not present

## 2016-03-10 DIAGNOSIS — R519 Headache, unspecified: Secondary | ICD-10-CM | POA: Insufficient documentation

## 2016-03-10 DIAGNOSIS — E1169 Type 2 diabetes mellitus with other specified complication: Secondary | ICD-10-CM

## 2016-03-10 DIAGNOSIS — E669 Obesity, unspecified: Secondary | ICD-10-CM

## 2016-03-10 DIAGNOSIS — R51 Headache: Secondary | ICD-10-CM

## 2016-03-10 DIAGNOSIS — G44001 Cluster headache syndrome, unspecified, intractable: Secondary | ICD-10-CM

## 2016-03-10 DIAGNOSIS — E119 Type 2 diabetes mellitus without complications: Secondary | ICD-10-CM

## 2016-03-10 LAB — HEMOGLOBIN A1C
HEMOGLOBIN A1C: 7.6 % — AB (ref <5.7)
Mean Plasma Glucose: 171 mg/dL

## 2016-03-10 MED ORDER — KETOROLAC TROMETHAMINE 60 MG/2ML IM SOLN
60.0000 mg | Freq: Once | INTRAMUSCULAR | Status: AC
  Administered 2016-03-10: 60 mg via INTRAMUSCULAR

## 2016-03-10 MED ORDER — CIPROFLOXACIN HCL 500 MG PO TABS: 500.0000 mg | ORAL_TABLET | Freq: Two times a day (BID) | ORAL | Status: DC

## 2016-03-10 MED ORDER — DICYCLOMINE HCL 10 MG PO CAPS: 10.0000 mg | ORAL_CAPSULE | Freq: Three times a day (TID) | ORAL | Status: DC

## 2016-03-10 NOTE — Patient Instructions (Addendum)
Annual physical exam in 4 month, call if you need me before  Pls sched mammogram for June 28 or after  Blood sugar much improved  You a have a stomach infection, 2 additional new meds , ciprofloxacin for 3 days , and a tablet for muscle spasms  Toradol for headache and 2 tylenol tablets in office  Non fast labs in 4 month  Stool sent for culture  Viral Gastroenteritis Viral gastroenteritis is also called stomach flu. This illness is caused by a certain type of germ (virus). It can cause sudden watery poop (diarrhea) and throwing up (vomiting). This can cause you to lose body fluids (dehydration). This illness usually lasts for 3 to 8 days. It usually goes away on its own. HOME CARE   Drink enough fluids to keep your pee (urine) clear or pale yellow. Drink small amounts of fluids often.  Ask your doctor how to replace body fluid losses (rehydration).  Avoid:  Foods high in sugar.  Alcohol.  Bubbly (carbonated) drinks.  Tobacco.  Juice.  Caffeine drinks.  Very hot or cold fluids.  Fatty, greasy foods.  Eating too much at one time.  Dairy products until 24 to 48 hours after your watery poop stops.  You may eat foods with active cultures (probiotics). They can be found in some yogurts and supplements.  Wash your hands well to avoid spreading the illness.  Only take medicines as told by your doctor. Do not give aspirin to children. Do not take medicines for watery poop (antidiarrheals).  Ask your doctor if you should keep taking your regular medicines.  Keep all doctor visits as told. GET HELP RIGHT AWAY IF:   You cannot keep fluids down.  You do not pee at least once every 6 to 8 hours.  You are short of breath.  You see blood in your poop or throw up. This may look like coffee grounds.  You have belly (abdominal) pain that gets worse or is just in one small spot (localized).  You keep throwing up or having watery poop.  You have a fever.  The patient  is a child younger than 3 months, and he or she has a fever.  The patient is a child older than 3 months, and he or she has a fever and problems that do not go away.  The patient is a child older than 3 months, and he or she has a fever and problems that suddenly get worse.  The patient is a baby, and he or she has no tears when crying. MAKE SURE YOU:   Understand these instructions.  Will watch your condition.  Will get help right away if you are not doing well or get worse.   This information is not intended to replace advice given to you by your health care provider. Make sure you discuss any questions you have with your health care provider.   Document Released: 03/28/2008 Document Revised: 01/02/2012 Document Reviewed: 07/27/2011 Elsevier Interactive Patient Education [employer] Inc[employer]   Work excuse from 5/16 return 03/14/2016  Food Choices to Help Relieve Diarrhea, Adult When you have diarrhea, the foods you eat and your eating habits are very important. Choosing the right foods and drinks can help relieve diarrhea. Also, because diarrhea can last up to 7 days, you need to replace lost fluids and electrolytes (such as sodium, potassium, and chloride) in order to help prevent dehydration.  WHAT GENERAL GUIDELINES DO I NEED TO FOLLOW?  Slowly drink 1 cup (  8 oz) of fluid for each episode of diarrhea. If you are getting enough fluid, your urine will be clear or pale yellow.  Eat starchy foods. Some good choices include white rice, white toast, pasta, low-fiber cereal, baked potatoes (without the skin), saltine crackers, and bagels.  Avoid large servings of any cooked vegetables.  Limit fruit to two servings per day. A serving is  cup or 1 small piece.  Choose foods with less than 2 g of fiber per serving.  Limit fats to less than 8 tsp (38 g) per day.  Avoid fried foods.  Eat foods that have probiotics in them. Probiotics can be found in certain dairy products.  Avoid  foods and beverages that may increase the speed at which food moves through the stomach and intestines (gastrointestinal tract). Things to avoid include:  High-fiber foods, such as dried fruit, raw fruits and vegetables, nuts, seeds, and whole grain foods.  Spicy foods and high-fat foods.  Foods and beverages sweetened with high-fructose corn syrup, honey, or sugar alcohols such as xylitol, sorbitol, and mannitol. WHAT FOODS ARE RECOMMENDED? Grains White rice. White, [place], or pita breads (fresh or toasted), including plain rolls, buns, or bagels. White pasta. Saltine, soda, or graham crackers. Pretzels. Low-fiber cereal. Cooked cereals made with water (such as cornmeal, farina, or cream cereals). Plain muffins. Matzo. Melba toast. Zwieback.  Vegetables Potatoes (without the skin). Strained tomato and vegetable juices. Most well-cooked and canned vegetables without seeds. Tender lettuce. Fruits Cooked or canned applesauce, apricots, cherries, fruit cocktail, grapefruit, peaches, pears, or plums. Fresh bananas, apples without skin, cherries, grapes, cantaloupe, grapefruit, peaches, oranges, or plums.  Meat and Other Protein Products Baked or boiled chicken. Eggs. Tofu. Fish. Seafood. Smooth peanut butter. Ground or well-cooked tender beef, ham, veal, lamb, pork, or poultry.  Dairy Plain yogurt, kefir, and unsweetened liquid yogurt. Lactose-free milk, buttermilk, or soy milk. Plain hard cheese. Beverages Sport drinks. Clear broths. Diluted fruit juices (except prune). Regular, caffeine-free sodas such as ginger ale. Water. Decaffeinated teas. Oral rehydration solutions. Sugar-free beverages not sweetened with sugar alcohols. Other Bouillon, broth, or soups made from recommended foods.  The items listed above may not be a complete list of recommended foods or beverages. Contact your dietitian for more options. WHAT FOODS ARE NOT RECOMMENDED? Grains Whole grain, whole wheat, bran, or rye  breads, rolls, pastas, crackers, and cereals. Wild or brown rice. Cereals that contain more than 2 g of fiber per serving. Corn tortillas or taco shells. Cooked or dry oatmeal. Granola. Popcorn. Vegetables Raw vegetables. Cabbage, broccoli, Brussels sprouts, artichokes, baked beans, beet greens, corn, kale, legumes, peas, sweet potatoes, and yams. Potato skins. Cooked spinach and cabbage. Fruits Dried fruit, including raisins and dates. Raw fruits. Stewed or dried prunes. Fresh apples with skin, apricots, mangoes, pears, raspberries, and strawberries.  Meat and Other Protein Products Chunky peanut butter. Nuts and seeds. Beans and lentils. Tomasa Blase.  Dairy High-fat cheeses. Milk, chocolate milk, and beverages made with milk, such as milk shakes. Cream. Ice cream. Sweets and Desserts Sweet rolls, doughnuts, and sweet breads. Pancakes and waffles. Fats and Oils Butter. Cream sauces. Margarine. Salad oils. Plain salad dressings. Olives. Avocados.  Beverages Caffeinated beverages (such as coffee, tea, soda, or energy drinks). Alcoholic beverages. Fruit juices with pulp. Prune juice. Soft drinks sweetened with high-fructose corn syrup or sugar alcohols. Other Coconut. Hot sauce. Chili powder. Mayonnaise. Gravy. Cream-based or milk-based soups.  The items listed above may not be a complete list of foods and beverages  to avoid. Contact your dietitian for more information. WHAT SHOULD I DO IF I BECOME DEHYDRATED? Diarrhea can sometimes lead to dehydration. Signs of dehydration include dark urine and dry mouth and skin. If you think you are dehydrated, you should rehydrate with an oral rehydration solution. These solutions can be purchased at pharmacies, retail stores, or online.  Drink -1 cup (120-240 mL) of oral rehydration solution each time you have an episode of diarrhea. If drinking this amount makes your diarrhea worse, try drinking smaller amounts more often. For example, drink 1-3 tsp (5-15 mL)  every 5-10 minutes.  A general rule for staying hydrated is to drink 1-2 L of fluid per day. Talk to your health care provider about the specific amount you should be drinking each day. Drink enough fluids to keep your urine clear or pale yellow.   This information is not intended to replace advice given to you by your health care provider. Make sure you discuss any questions you have with your health care provider.   Document Released: 12/31/2003 Document Revised: 10/31/2014 Document Reviewed: 09/02/2013 Elsevier Interactive Patient Education [employer]

## 2016-03-10 NOTE — Progress Notes (Signed)
Subjective:    Patient ID: Glenda Thompson, female    DOB: 1971-05-05, 45 y.o.   MRN: [id number]  HPI 4 day h/o worsening diarrhea , loose stool , stomach pain and nausea, has been unable to work, having chills and possible fever. No other known sick contact C/o frontal headache x 2 days   Review of Systems See HPI Denies sinus pressure, nasal congestion, ear pain or sore throat. Denies chest congestion, productive cough or wheezing. Denies chest pains, palpitations and leg swelling    Denies dysuria, frequency, hesitancy or incontinence. Denies joint pain, swelling and limitation in mobility. Denies , seizures, numbness, or tingling. Denies depression, anxiety or insomnia. Denies skin break down or rash.        Objective:   Physical Exam  BP 138/84 mmHg  Pulse 94  Temp(Src) 99.3 F (37.4 C) (Oral)  Resp 16  Ht 5\' 5"  (1.651 m)  Wt 247 lb (112.038 kg)  BMI 41.10 kg/m2  SpO2 99%  Patient alert and oriented and in no cardiopulmonary distress.Ill appearing  HEENT: No facial asymmetry, EOMI,   oropharynx pink and moist.  Neck supple no JVD, no mass.  Chest: Clear to auscultation bilaterally.  CVS: S1, S2 no murmurs, no S3.Regular rate.  ABD: Soft generalized superficial tenderness, no guarding or rebound, hyperactive BS Ext: No edema  MS: Adequate ROM spine, shoulders, hips and knees.  Skin: Intact, no ulcerations or rash noted.  Psych: Good eye contact, normal affect. Memory intact not anxious or depressed appearing.  CNS: CN 2-12 intact, power,  normal throughout.no focal deficits noted.       Assessment & Plan:  Acute infective gastroenteritis Stool for c/s, 3 day course of cipro prescribed, pt ed, work excuse and dicyclomine and lomotil for symptom control  Headache No focal neurologic deficit on exam. Toradol in office and tylenol given orally  Hypertension goal BP (blood pressure) < 130/80 Controlled, no change in medication DASH diet and  commitment to daily physical activity for a minimum of 30 minutes discussed and encouraged, as a part of hypertension management. The importance of attaining a healthy weight is also discussed.  BP/Weight 03/10/2016 12/01/2015 11/30/2015 05/11/2015 04/13/2015 10/10/2014 08/08/2014  Systolic BP 138 154 181 134 152 - -  Diastolic BP 84 82 67 82 84 - -  Wt. (Lbs) 247 255 250 248 242.4 243 240  BMI 41.1 42.43 41.6 41.27 40.34 40.44 39.94        Diabetes mellitus type 2 in obese Glenda Thompson is reminded of the importance of commitment to daily physical activity for 30 minutes or more, as able and the need to limit carbohydrate intake to 30 to 60 grams per meal to help with blood sugar control.   The need to take medication as prescribed, test blood sugar as directed, and to call between visits if there is a concern that blood sugar is uncontrolled is also discussed.   Glenda Thompson is reminded of the importance of daily foot exam, annual eye examination, and good blood sugar, blood pressure and cholesterol control. Updated lab needed at/ before next visit. Improved  Diabetic Labs Latest Ref Rng 03/08/2016 12/01/2015 04/13/2015 10/02/2014 06/13/2014  HbA1c <5.7 % 7.6(H) 10.0(H) - - -  Microalbumin <2.0 mg/dL - - <4.0<0.2 - -  Micro/Creat Ratio 0.0 - 30.0 mg/g - - SEE NOTE - -  Chol 0 - 200 mg/dL - - - 981156 -  HDL >19>39 mg/dL - - - 46 -  Calc LDL  0 - 99 mg/dL - - - 99 -  Triglycerides <150 mg/dL - - - 54 -  Creatinine 0.50 - 1.10 mg/dL 5.78 4.69 - 6.29 5.28   BP/Weight 03/10/2016 12/01/2015 11/30/2015 05/11/2015 04/13/2015 10/10/2014 08/08/2014  Systolic BP 138 154 181 134 152 - -  Diastolic BP 84 82 67 82 84 - -  Wt. (Lbs) 247 255 250 248 242.4 243 240  BMI 41.1 42.43 41.6 41.27 40.34 40.44 39.94   Foot/eye exam completion dates 04/13/2015 04/09/2014  Foot Form Completion Done Done

## 2016-03-15 ENCOUNTER — Other Ambulatory Visit: Payer: Self-pay | Admitting: Family Medicine

## 2016-03-15 ENCOUNTER — Other Ambulatory Visit: Payer: Self-pay

## 2016-03-15 MED ORDER — CIPROFLOXACIN HCL 500 MG PO TABS: 500.0000 mg | ORAL_TABLET | Freq: Two times a day (BID) | ORAL | Status: DC

## 2016-03-15 MED ORDER — FLUCONAZOLE 150 MG PO TABS: ORAL_TABLET | ORAL | Status: DC

## 2016-03-20 NOTE — Assessment & Plan Note (Signed)
Stool for c/s, 3 day course of cipro prescribed, pt ed, work excuse and dicyclomine and lomotil for symptom control

## 2016-03-20 NOTE — Assessment & Plan Note (Signed)
Glenda Thompson is reminded of the importance of commitment to daily physical activity for 30 minutes or more, as able and the need to limit carbohydrate intake to 30 to 60 grams per meal to help with blood sugar control.   The need to take medication as prescribed, test blood sugar as directed, and to call between visits if there is a concern that blood sugar is uncontrolled is also discussed.   Glenda Thompson is reminded of the importance of daily foot exam, annual eye examination, and good blood sugar, blood pressure and cholesterol control. Updated lab needed at/ before next visit. Improved  Diabetic Labs Latest Ref Rng 03/08/2016 12/01/2015 04/13/2015 10/02/2014 06/13/2014  HbA1c <5.7 % 7.6(H) 10.0(H) - - -  Microalbumin <2.0 mg/dL - - <1.9<0.2 - -  Micro/Creat Ratio 0.0 - 30.0 mg/g - - SEE NOTE - -  Chol 0 - 200 mg/dL - - - 147156 -  HDL >82>39 mg/dL - - - 46 -  Calc LDL 0 - 99 mg/dL - - - 99 -  Triglycerides <150 mg/dL - - - 54 -  Creatinine 0.50 - 1.10 mg/dL 9.561.06 2.130.86 - 0.860.99 5.780.90   BP/Weight 03/10/2016 12/01/2015 11/30/2015 05/11/2015 04/13/2015 10/10/2014 08/08/2014  Systolic BP 138 154 181 134 152 - -  Diastolic BP 84 82 67 82 84 - -  Wt. (Lbs) 247 255 250 248 242.4 243 240  BMI 41.1 42.43 41.6 41.27 40.34 40.44 39.94   Foot/eye exam completion dates 04/13/2015 04/09/2014  Foot Form Completion Done Done

## 2016-03-20 NOTE — Assessment & Plan Note (Signed)
Controlled, no change in medication DASH diet and commitment to daily physical activity for a minimum of 30 minutes discussed and encouraged, as a part of hypertension management. The importance of attaining a healthy weight is also discussed.  BP/Weight 03/10/2016 12/01/2015 11/30/2015 05/11/2015 04/13/2015 10/10/2014 08/08/2014  Systolic BP 138 154 181 134 152 - -  Diastolic BP 84 82 67 82 84 - -  Wt. (Lbs) 247 255 250 248 242.4 243 240  BMI 41.1 42.43 41.6 41.27 40.34 40.44 39.94

## 2016-03-20 NOTE — Assessment & Plan Note (Signed)
No focal neurologic deficit on exam. Toradol in office and tylenol given orally

## 2016-03-22 DIAGNOSIS — Z0289 Encounter for other administrative examinations: Secondary | ICD-10-CM

## 2016-03-28 ENCOUNTER — Other Ambulatory Visit: Payer: Self-pay | Admitting: Family Medicine

## 2016-04-08 LAB — STOOL CULTURE

## 2016-06-01 ENCOUNTER — Telehealth: Payer: Self-pay | Admitting: Family Medicine

## 2016-06-01 DIAGNOSIS — A09 Infectious gastroenteritis and colitis, unspecified: Secondary | ICD-10-CM

## 2016-06-01 DIAGNOSIS — R197 Diarrhea, unspecified: Secondary | ICD-10-CM

## 2016-06-01 NOTE — Telephone Encounter (Signed)
Called patient and left message for them to return call at the office   

## 2016-06-01 NOTE — Telephone Encounter (Signed)
Patient is asking for something to be called in for sinus issues, she states that she has a sinus headache and ear ache, denies any fever symptoms started yesterday, please advise?

## 2016-06-02 NOTE — Telephone Encounter (Signed)
States when she has a bowel movement it burns and the stool is yellowish again like it was when she had the gastritis. Wants to know if she needs appt or if something needs to be sent in for that

## 2016-06-02 NOTE — Telephone Encounter (Signed)
pls send stool for C diff and c/s if having diarrheah, and advise OTC immodium in the u interim, can get appt next Monday also pls

## 2016-06-02 NOTE — Telephone Encounter (Signed)
Coming to collect stool tests. Appt scheduled

## 2016-06-06 ENCOUNTER — Ambulatory Visit: Payer: BLUE CROSS/BLUE SHIELD | Admitting: Family Medicine

## 2016-06-08 ENCOUNTER — Other Ambulatory Visit: Payer: Self-pay | Admitting: Family Medicine

## 2016-07-01 ENCOUNTER — Encounter: Payer: Self-pay | Admitting: Family Medicine

## 2016-07-01 ENCOUNTER — Other Ambulatory Visit: Payer: Self-pay

## 2016-07-01 ENCOUNTER — Ambulatory Visit (INDEPENDENT_AMBULATORY_CARE_PROVIDER_SITE_OTHER): Payer: BLUE CROSS/BLUE SHIELD | Admitting: Family Medicine

## 2016-07-01 VITALS — BP 138/76 | HR 82 | Resp 18 | Ht 65.0 in | Wt 260.0 lb

## 2016-07-01 DIAGNOSIS — J309 Allergic rhinitis, unspecified: Secondary | ICD-10-CM | POA: Diagnosis not present

## 2016-07-01 DIAGNOSIS — N921 Excessive and frequent menstruation with irregular cycle: Secondary | ICD-10-CM | POA: Diagnosis not present

## 2016-07-01 DIAGNOSIS — Z23 Encounter for immunization: Secondary | ICD-10-CM

## 2016-07-01 MED ORDER — FLUTICASONE PROPIONATE 50 MCG/ACT NA SUSP: 2.0000 | Freq: Every day | NASAL | 12 refills | Status: DC

## 2016-07-01 MED ORDER — CETIRIZINE HCL 10 MG PO TABS: 10.0000 mg | ORAL_TABLET | Freq: Every day | ORAL | 11 refills | Status: DC

## 2016-07-01 MED ORDER — MOMETASONE FUROATE 50 MCG/ACT NA SUSP: 2.0000 | Freq: Every day | NASAL | 12 refills | Status: DC

## 2016-07-01 NOTE — Patient Instructions (Signed)
Drink plenty of water  Take the cetirizine and use the nasal spray daily until the sinus symptoms improve  The irregular bleeding should work out by the end of the month Call if it persists  Follow up with Dr Lodema HongSimpson as scheduled

## 2016-07-01 NOTE — Progress Notes (Addendum)
Chief Complaint  Patient presents with  . Menstrual Problem    abnormal with abdominal pain   . Headache    sinus headaches x 8 days   Patient is here for an acute visit. She normally sees Dr. Syliva OvermanMargaret Simpson. She was worked in today for 2 problems. First she is having irregular menstrual bleeding. She states normally her cycles are quite regular. This month, however, she's been spotting for about 8 days. No heavy bleeding. No cramping. She wonders if she is going through "change". No hot flashes. We had a discussion regarding menstrual periods, and that sometimes a person has incomplete shedding and will have some irregular bleeding. She does not require workup unless this persists. Her second problem is sinus pressure and headaches. This is been worse lately. She thinks she has allergies. She is not taking any allergy medicine. She has clear postnasal drip. Sinus pressure and pain across her face and behind her eyes. Some ear pressure and pain. No sore throat or cough. No fever or chills. No purulent discharge.   As an aside she mentions that she would like for me to prescribe a pill to help her lose weight. I advised her to talk to her regular primary care doctor for this. We briefly reviewed the benefits of diet, portion control, and regular exercise.   Patient Active Problem List   Diagnosis Date Noted  . Acute infective gastroenteritis 03/10/2016  . Headache 03/10/2016  . Vitamin D deficiency 10/13/2011  . MENORRHAGIA 03/27/2009  . FATIGUE 09/24/2008  . Hyperlipidemia LDL goal <100 09/06/2008  . Diabetes mellitus type 2 in obese (HCC) 05/29/2008  . Morbid obesity (HCC) 05/29/2008  . Hypertension goal BP (blood pressure) < 130/80 05/29/2008    Outpatient Encounter Prescriptions as of 07/01/2016  Medication Sig  . amLODipine (NORVASC) 10 MG tablet Take 1 tablet (10 mg total) by mouth daily.  . dapagliflozin propanediol (FARXIGA) 10 MG TABS tablet Take 10 mg by mouth daily.  Marland Kitchen.  FREESTYLE LITE test strip USE TO TEST THREE TIMES DAILY  . Insulin Detemir (LEVEMIR) 100 UNIT/ML Pen Inject 35 Units into the skin daily at 10 pm.  . Insulin Pen Needle 31G X 8 MM MISC To use with insulin pen daily dx e11.9  . Lancets (FREESTYLE) lancets TEST THREE TIMES DAILY  . lisinopril-hydrochlorothiazide (PRINZIDE,ZESTORETIC) 20-12.5 MG tablet TAKE 2 TABLETS BY MOUTH DAILY  . spironolactone (ALDACTONE) 25 MG tablet TAKE 1 TABLET BY MOUTH DAILY  . cetirizine (ZYRTEC) 10 MG tablet Take 1 tablet (10 mg total) by mouth daily.  . mometasone (NASONEX) 50 MCG/ACT nasal spray Place 2 sprays into the nose daily.   No facility-administered encounter medications on file as of 07/01/2016.     Allergies  Allergen Reactions  . Metformin And Related Diarrhea    Review of Systems  Constitutional: Negative for activity change, appetite change, chills and fever.  HENT: Positive for congestion, ear pain, facial swelling, postnasal drip, rhinorrhea and sinus pressure. Negative for sore throat.   Eyes: Negative for discharge, redness and itching.  Respiratory: Negative for cough and chest tightness.   Cardiovascular: Negative for chest pain, palpitations and leg swelling.  Gastrointestinal: Negative for abdominal pain, constipation and diarrhea.  Genitourinary: Negative for dysuria and frequency.  Musculoskeletal: Negative for back pain and neck pain.  Allergic/Immunologic: Positive for environmental allergies.  Neurological: Positive for headaches. Negative for light-headedness.  Hematological: Negative for adenopathy. Does not bruise/bleed easily.    BP 138/76   Pulse  82   Resp 18   Ht 5\' 5"  (1.651 m)   Wt 260 lb (117.9 kg)   LMP 06/23/2016 (Exact Date)   SpO2 98%   BMI 43.27 kg/m   Physical Exam  Constitutional: She is oriented to person, place, and time. She appears well-developed and well-nourished. No distress.  HENT:  Head: Normocephalic and atraumatic.  Right Ear: External ear  normal.  Left Ear: External ear normal.  Nose: Nose normal.  Mouth/Throat: Oropharynx is clear and moist. No oropharyngeal exudate.  Nasal membranes slightly swollen. Sinuses mildly tender. Clear PND.  Eyes: Conjunctivae are normal. Pupils are equal, round, and reactive to light.  Neck: Normal range of motion. No thyromegaly present.  Cardiovascular: Normal rate, regular rhythm and normal heart sounds.   Pulmonary/Chest: Effort normal and breath sounds normal. No respiratory distress.  Abdominal: Soft. Bowel sounds are normal.  Musculoskeletal: Normal range of motion. She exhibits no edema.  Lymphadenopathy:    She has no cervical adenopathy.  Neurological: She is alert and oriented to person, place, and time.  Psychiatric: She has a normal mood and affect. Her behavior is normal. Thought content normal.    1. Allergic sinusitis Patient is prescribed Zyrtec and Flonase to use for her symptoms. Importance of preventing antibiotics for non-bacterial infections is reviewed.   2. Menorrhagia with irregular cycle Discussed.  3. Encounter for immunization  - Flu Vaccine QUAD 36+ mos IM   Patient Instructions  Drink plenty of water  Take the cetirizine and use the nasal spray daily until the sinus symptoms improve  The irregular bleeding should work out by the end of the month Call if it persists  Follow up with Dr Lodema Hong as scheduled   Eustace Moore, MD

## 2016-08-02 ENCOUNTER — Other Ambulatory Visit: Payer: Self-pay | Admitting: Family Medicine

## 2016-08-30 ENCOUNTER — Encounter: Payer: BLUE CROSS/BLUE SHIELD | Admitting: Family Medicine

## 2016-09-30 ENCOUNTER — Ambulatory Visit (INDEPENDENT_AMBULATORY_CARE_PROVIDER_SITE_OTHER): Payer: BLUE CROSS/BLUE SHIELD | Admitting: Family Medicine

## 2016-09-30 ENCOUNTER — Telehealth: Payer: Self-pay

## 2016-09-30 ENCOUNTER — Encounter: Payer: Self-pay | Admitting: Family Medicine

## 2016-09-30 VITALS — BP 136/88 | HR 82 | Temp 98.4°F | Resp 16 | Ht 65.0 in | Wt 264.0 lb

## 2016-09-30 DIAGNOSIS — N921 Excessive and frequent menstruation with irregular cycle: Secondary | ICD-10-CM | POA: Diagnosis not present

## 2016-09-30 DIAGNOSIS — K5900 Constipation, unspecified: Secondary | ICD-10-CM

## 2016-09-30 DIAGNOSIS — E1169 Type 2 diabetes mellitus with other specified complication: Secondary | ICD-10-CM

## 2016-09-30 DIAGNOSIS — I10 Essential (primary) hypertension: Secondary | ICD-10-CM

## 2016-09-30 DIAGNOSIS — E669 Obesity, unspecified: Secondary | ICD-10-CM

## 2016-09-30 LAB — POCT URINALYSIS DIPSTICK
Bilirubin, UA: NEGATIVE
GLUCOSE UA: 500
Ketones, UA: 15
LEUKOCYTES UA: NEGATIVE
Nitrite, UA: NEGATIVE
PROTEIN UA: NEGATIVE
Spec Grav, UA: 1.015
UROBILINOGEN UA: 0.2
pH, UA: 7

## 2016-09-30 LAB — BASIC METABOLIC PANEL WITH GFR
BUN: 19 mg/dL (ref 7–25)
CALCIUM: 9.2 mg/dL (ref 8.6–10.2)
CO2: 27 mmol/L (ref 20–31)
Chloride: 99 mmol/L (ref 98–110)
Creat: 0.89 mg/dL (ref 0.50–1.10)
GFR, EST NON AFRICAN AMERICAN: 78 mL/min (ref >=60)
Glucose, Bld: 145 mg/dL — ABNORMAL HIGH (ref 65–99)
Potassium: 4.2 mmol/L (ref 3.5–5.3)
SODIUM: 136 mmol/L (ref 135–146)

## 2016-09-30 LAB — LUTEINIZING HORMONE: LH: 6 m[IU]/mL

## 2016-09-30 LAB — FOLLICLE STIMULATING HORMONE: FSH: 9.7 m[IU]/mL

## 2016-09-30 NOTE — Telephone Encounter (Signed)
Expired labs reordered  

## 2016-09-30 NOTE — Progress Notes (Signed)
Chief Complaint  Patient presents with  . Abdominal Pain    x 2 days  lower abdominal crampy pain Some decreased appetite and nausea Feels distended Has not had a bowel movement in almost a week Discussed constipation  Also still complains of irregular menses.  Will get FSH/LH to check for menopause   Patient Active Problem List   Diagnosis Date Noted  . Acute infective gastroenteritis 03/10/2016  . Headache 03/10/2016  . Vitamin D deficiency 10/13/2011  . MENORRHAGIA 03/27/2009  . FATIGUE 09/24/2008  . Hyperlipidemia LDL goal <100 09/06/2008  . Diabetes mellitus type 2 in obese (HCC) 05/29/2008  . Morbid obesity (HCC) 05/29/2008  . Hypertension goal BP (blood pressure) < 130/80 05/29/2008    Outpatient Encounter Prescriptions as of 09/30/2016  Medication Sig  . amLODipine (NORVASC) 10 MG tablet TAKE 1 TABLET BY MOUTH DAILY  . cetirizine (ZYRTEC) 10 MG tablet Take 1 tablet (10 mg total) by mouth daily.  . dapagliflozin propanediol (FARXIGA) 10 MG TABS tablet Take 10 mg by mouth daily.  . fluticasone (FLONASE) 50 MCG/ACT nasal spray Place 2 sprays into both nostrils daily.  Marland Kitchen. FREESTYLE LITE test strip USE TO TEST THREE TIMES DAILY  . Insulin Detemir (LEVEMIR) 100 UNIT/ML Pen Inject 35 Units into the skin daily at 10 pm.  . Insulin Pen Needle 31G X 8 MM MISC To use with insulin pen daily dx e11.9  . Lancets (FREESTYLE) lancets TEST THREE TIMES DAILY  . lisinopril-hydrochlorothiazide (PRINZIDE,ZESTORETIC) 20-12.5 MG tablet TAKE 2 TABLETS BY MOUTH DAILY  . spironolactone (ALDACTONE) 25 MG tablet TAKE 1 TABLET BY MOUTH DAILY   No facility-administered encounter medications on file as of 09/30/2016.     Allergies  Allergen Reactions  . Metformin And Related Diarrhea    Review of Systems  Constitutional: Positive for appetite change. Negative for chills, fever and unexpected weight change.  HENT: Negative.  Negative for congestion.   Eyes: Negative.  Negative for visual  disturbance.  Respiratory: Negative for cough and shortness of breath.   Cardiovascular: Negative for chest pain and palpitations.  Gastrointestinal: Positive for abdominal distention, abdominal pain, constipation and nausea. Negative for blood in stool and vomiting.  Genitourinary: Positive for menstrual problem. Negative for difficulty urinating, dysuria and frequency.  Musculoskeletal: Positive for back pain.  Neurological: Negative for dizziness and headaches.    BP 136/88 (BP Location: Right Arm, Patient Position: Sitting, Cuff Size: Large)   Pulse 82   Temp 98.4 F (36.9 C) (Oral)   Resp 16   Ht 5\' 5"  (1.651 m)   Wt 264 lb 0.6 oz (119.8 kg)   LMP 09/07/2016 (Exact Date)   SpO2 100%   BMI 43.94 kg/m   Physical Exam  Constitutional: She is oriented to person, place, and time. She appears well-developed and well-nourished.  No distress  HENT:  Head: Normocephalic.  Mouth/Throat: Oropharynx is clear and moist.  Eyes: Conjunctivae are normal. Pupils are equal, round, and reactive to light.  Cardiovascular: Normal rate, regular rhythm and normal heart sounds.   Pulmonary/Chest: Effort normal and breath sounds normal. She has no wheezes.  Abdominal: Soft. Bowel sounds are normal. She exhibits no distension. There is tenderness.  Diffuse general mild tenderness without guard or rebound  Musculoskeletal: Normal range of motion. She exhibits no edema.  Neurological: She is alert and oriented to person, place, and time.  Psychiatric: She has a normal mood and affect. Her behavior is normal. Thought content normal.    ASSESSMENT/PLAN:  1. Constipation, unspecified constipation type  - POCT urinalysis dipstick - negative  2. Menorrhagia with irregular cycle  - FSH - LH   Patient Instructions  Drink plenty of water Eat foods rich in fiber Take the metamucil every day  Today - need stronger laxative to have movement.  Would try milk of magnesia per package instruction,   DO NOT USE DAILY, only if you have not had a BM in 3 days     Constipation, Adult Constipation is when a person has fewer bowel movements in a week than normal, has difficulty having a bowel movement, or has stools that are dry, hard, or larger than normal. Constipation may be caused by an underlying condition. It may become worse with age if a person takes certain medicines and does not take in enough fluids. Follow these instructions at home: Eating and drinking  Eat foods that have a lot of fiber, such as fresh fruits and vegetables, whole grains, and beans.  Limit foods that are high in fat, low in fiber, or overly processed, such as french fries, hamburgers, cookies, candies, and soda.  Drink enough fluid to keep your urine clear or pale yellow. General instructions  Exercise regularly or as told by your health care provider.  Go to the restroom when you have the urge to go. Do not hold it in.  Take over-the-counter and prescription medicines only as told by your health care provider. These include any fiber supplements.  Practice pelvic floor retraining exercises, such as deep breathing while relaxing the lower abdomen and pelvic floor relaxation during bowel movements.  Watch your condition for any changes.  Keep all follow-up visits as told by your health care provider. This is important. Contact a health care provider if:  You have pain that gets worse.  You have a fever.  You do not have a bowel movement after 4 days.  You vomit.  You are not hungry.  You lose weight.  You are bleeding from the anus.  You have thin, pencil-like stools. Get help right away if:  You have a fever and your symptoms suddenly get worse.  You leak stool or have blood in your stool.  Your abdomen is bloated.  You have severe pain in your abdomen.  You feel dizzy or you faint. This information is not intended to replace advice given to you by your health care provider. Make  sure you discuss any questions you have with your health care provider. Document Released: 07/08/2004 Document Revised: 04/29/2016 Document Reviewed: 03/30/2016 Elsevier Interactive Patient Education  2017 [employer]    Eustace MooreYvonne Sue Jada Fass, MD

## 2016-09-30 NOTE — Patient Instructions (Addendum)
Drink plenty of water Eat foods rich in fiber Take the metamucil every day  Today - need stronger laxative to have movement.  Would try milk of magnesia per package instruction,  DO NOT USE DAILY, only if you have not had a BM in 3 days     Constipation, Adult Constipation is when a person has fewer bowel movements in a week than normal, has difficulty having a bowel movement, or has stools that are dry, hard, or larger than normal. Constipation may be caused by an underlying condition. It may become worse with age if a person takes certain medicines and does not take in enough fluids. Follow these instructions at home: Eating and drinking  Eat foods that have a lot of fiber, such as fresh fruits and vegetables, whole grains, and beans.  Limit foods that are high in fat, low in fiber, or overly processed, such as french fries, hamburgers, cookies, candies, and soda.  Drink enough fluid to keep your urine clear or pale yellow. General instructions  Exercise regularly or as told by your health care provider.  Go to the restroom when you have the urge to go. Do not hold it in.  Take over-the-counter and prescription medicines only as told by your health care provider. These include any fiber supplements.  Practice pelvic floor retraining exercises, such as deep breathing while relaxing the lower abdomen and pelvic floor relaxation during bowel movements.  Watch your condition for any changes.  Keep all follow-up visits as told by your health care provider. This is important. Contact a health care provider if:  You have pain that gets worse.  You have a fever.  You do not have a bowel movement after 4 days.  You vomit.  You are not hungry.  You lose weight.  You are bleeding from the anus.  You have thin, pencil-like stools. Get help right away if:  You have a fever and your symptoms suddenly get worse.  You leak stool or have blood in your stool.  Your abdomen is  bloated.  You have severe pain in your abdomen.  You feel dizzy or you faint. This information is not intended to replace advice given to you by your health care provider. Make sure you discuss any questions you have with your health care provider. Document Released: 07/08/2004 Document Revised: 04/29/2016 Document Reviewed: 03/30/2016 Elsevier Interactive Patient Education  2017 [employer]

## 2016-10-01 LAB — HEMOGLOBIN A1C
HEMOGLOBIN A1C: 9 % — AB (ref <5.7)
MEAN PLASMA GLUCOSE: 212 mg/dL

## 2016-10-12 ENCOUNTER — Encounter: Payer: Self-pay | Admitting: Family Medicine

## 2016-10-12 ENCOUNTER — Ambulatory Visit (INDEPENDENT_AMBULATORY_CARE_PROVIDER_SITE_OTHER): Payer: BLUE CROSS/BLUE SHIELD | Admitting: Family Medicine

## 2016-10-12 VITALS — BP 158/82 | HR 88 | Resp 16 | Ht 65.0 in | Wt 265.0 lb

## 2016-10-12 DIAGNOSIS — Z0000 Encounter for general adult medical examination without abnormal findings: Secondary | ICD-10-CM | POA: Diagnosis not present

## 2016-10-12 DIAGNOSIS — E559 Vitamin D deficiency, unspecified: Secondary | ICD-10-CM

## 2016-10-12 DIAGNOSIS — Z1211 Encounter for screening for malignant neoplasm of colon: Secondary | ICD-10-CM

## 2016-10-12 DIAGNOSIS — I10 Essential (primary) hypertension: Secondary | ICD-10-CM

## 2016-10-12 DIAGNOSIS — Z23 Encounter for immunization: Secondary | ICD-10-CM

## 2016-10-12 DIAGNOSIS — E1169 Type 2 diabetes mellitus with other specified complication: Secondary | ICD-10-CM

## 2016-10-12 DIAGNOSIS — E785 Hyperlipidemia, unspecified: Secondary | ICD-10-CM | POA: Diagnosis not present

## 2016-10-12 DIAGNOSIS — E669 Obesity, unspecified: Secondary | ICD-10-CM

## 2016-10-12 DIAGNOSIS — R102 Pelvic and perineal pain: Secondary | ICD-10-CM

## 2016-10-12 LAB — POC HEMOCCULT BLD/STL (OFFICE/1-CARD/DIAGNOSTIC): Fecal Occult Blood, POC: NEGATIVE

## 2016-10-12 MED ORDER — INSULIN DETEMIR 100 UNIT/ML FLEXPEN: PEN_INJECTOR | SUBCUTANEOUS | 11 refills | Status: DC

## 2016-10-12 NOTE — Assessment & Plan Note (Signed)

## 2016-10-12 NOTE — Assessment & Plan Note (Signed)
Uncontrolled, inc levemir to 45 units then 50 units if needed. Call nurse weekly or send e mail [name] is reminded of the importance of commitment to daily physical activity for 30 minutes or more, as able and the need to limit carbohydrate intake to 30 to 60 grams per meal to help with blood sugar control.   The need to take medication as prescribed, test blood sugar as directed, and to call between visits if there is a concern that blood sugar is uncontrolled is also discussed.   [name] is reminded of the importance of daily foot exam, annual eye examination, and good blood sugar, blood pressure and cholesterol control.  Diabetic Labs Latest Ref Rng & Units 09/30/2016 03/08/2016 12/01/2015 04/13/2015 10/02/2014  HbA1c <5.7 % 9.0(H) 7.6(H) 10.0(H) - -  Microalbumin <2.0 mg/dL - - - <1.6<0.2 -  Micro/Creat Ratio 0.0 - 30.0 mg/g - - - SEE NOTE -  Chol 0 - 200 mg/dL - - - - 109156  HDL >60>39 mg/dL - - - - 46  Calc LDL 0 - 99 mg/dL - - - - 99  Triglycerides <150 mg/dL - - - - 54  Creatinine 0.50 - 1.10 mg/dL 4.540.89 0.981.06 1.190.86 - 1.470.99   BP/Weight 10/12/2016 09/30/2016 07/01/2016 03/10/2016 12/01/2015 11/30/2015 05/11/2015  Systolic BP 158 136 138 138 154 181 134  Diastolic BP 82 88 76 84 82 67 82  Wt. (Lbs) 265 264.04 260 247 255 250 248  BMI 44.1 43.94 43.27 41.1 42.43 41.6 41.27   Foot/eye exam completion dates 04/13/2015 04/09/2014  Foot Form Completion Done Done      Updated lab needed at/ before next visit.

## 2016-10-12 NOTE — Assessment & Plan Note (Signed)
After obtaining informed consent, the vaccine is  administered by LPN.  

## 2016-10-12 NOTE — Assessment & Plan Note (Signed)
Uncontrolled due to non compliance, not taking amlodipine daily as she should , re educated. DASH diet and commitment to daily physical activity for a minimum of 30 minutes discussed and encouraged, as a part of hypertension management. The importance of attaining a healthy weight is also discussed.  BP/Weight 10/12/2016 09/30/2016 07/01/2016 03/10/2016 12/01/2015 11/30/2015 05/11/2015  Systolic BP 158 136 138 138 154 181 134  Diastolic BP 82 88 76 84 82 67 82  Wt. (Lbs) 265 264.04 260 247 255 250 248  BMI 44.1 43.94 43.27 41.1 42.43 41.6 41.27

## 2016-10-12 NOTE — Progress Notes (Signed)
Glenda Thompson     MRN: [id number]      DOB: 01/31/1971  HPI: Patient is in for annual physical exam. Uncontrolled diabetes and hypertension are addressed at this visit also, extensive counseling done in chronic disease management, need for lifestyle change and medication adjustments are also made 2 week h/o pelvic pain and abnormal vaginal bleeding Recent labs, if available are reviewed. Immunization is reviewed , and  updated    PE:  BP (!) 158/82   Pulse 88   Resp 16   Ht 5\' 5"  (1.651 m)   Wt 265 lb (120.2 kg)   LMP 09/07/2016 (Exact Date)   SpO2 99%   BMI 44.10 kg/m   Pleasant  female, alert and oriented x 3, in no cardio-pulmonary distress. Afebrile. HEENT No facial trauma or asymetry. Sinuses non tender.  Extra occullar muscles intact, pupils equally reactive to light. External ears normal, tympanic membranes clear. Oropharynx moist, no exudate. Neck: supple, no adenopathy,JVD or thyromegaly.No bruits.  Chest: Clear to ascultation bilaterally.No crackles or wheezes. Non tender to palpation  Breast: No asymetry,no masses or lumps. No tenderness. No nipple discharge or inversion. No axillary or supraclavicular adenopathy  Cardiovascular system; Heart sounds normal,  S1 and  S2 ,no S3.  No murmur, or thrill. Apical beat not displaced Peripheral pulses normal.  Abdomen: Soft, non tender, no organomegaly or masses. No bruits. Bowel sounds normal. No guarding, tenderness or rebound.  Rectal:  Normal sphincter tone. No rectal mass. Guaiac negative stool.  GU: External genitalia normal female genitalia , normal female distribution of hair. No lesions. Urethral meatus normal in size, no  Prolapse, no lesions visibly  Present. Bladder non tender. Vagina pink and moist , with no visible lesions , discharge present . Adequate pelvic support no  cystocele or rectocele noted Cervix pink and appears healthy, no lesions or ulcerations noted, no discharge noted  from os Uterus enlarged, no adnexal mass, no cervical motion or adnexal tenderness   Musculoskeletal exam: Full ROM of spine, hips , shoulders and knees. No deformity ,swelling or crepitus noted. No muscle wasting or atrophy.   Neurologic: Cranial nerves 2 to 12 intact. Power, tone ,sensation and reflexes normal throughout. No disturbance in gait. No tremor.  Skin: Intact, no ulceration, erythema , scaling or rash noted. Pigmentation normal throughout  Psych; Normal mood and affect. Judgement and concentration normal   Assessment & Plan:  Annual physical exam Annual exam as documented. Counseling done  re healthy lifestyle involving commitment to 150 minutes exercise per week, heart healthy diet, and attaining healthy weight.The importance of adequate sleep also discussed. Regular seat belt use and home safety, is also discussed. Changes in health habits are decided on by the patient with goals and time frames  set for achieving them. Immunization and cancer screening needs are specifically addressed at this visit.   Diabetes mellitus type 2 in obese Uncontrolled, inc levemir to 45 units then 50 units if needed. Call nurse weekly or send e mail Glenda Thompson is reminded of the importance of commitment to daily physical activity for 30 minutes or more, as able and the need to limit carbohydrate intake to 30 to 60 grams per meal to help with blood sugar control.   The need to take medication as prescribed, test blood sugar as directed, and to call between visits if there is a concern that blood sugar is uncontrolled is also discussed.   Glenda Thompson is reminded of the importance of daily  foot exam, annual eye examination, and good blood sugar, blood pressure and cholesterol control.  Diabetic Labs Latest Ref Rng & Units 09/30/2016 03/08/2016 12/01/2015 04/13/2015 10/02/2014  HbA1c <5.7 % 9.0(H) 7.6(H) 10.0(H) - -  Microalbumin <2.0 mg/dL - - - <1.4<0.2 -  Micro/Creat Ratio 0.0 - 30.0  mg/g - - - SEE NOTE -  Chol 0 - 200 mg/dL - - - - 782156  HDL >95>39 mg/dL - - - - 46  Calc LDL 0 - 99 mg/dL - - - - 99  Triglycerides <150 mg/dL - - - - 54  Creatinine 0.50 - 1.10 mg/dL 6.210.89 3.081.06 6.570.86 - 8.460.99   BP/Weight 10/12/2016 09/30/2016 07/01/2016 03/10/2016 12/01/2015 11/30/2015 05/11/2015  Systolic BP 158 136 138 138 154 181 134  Diastolic BP 82 88 76 84 82 67 82  Wt. (Lbs) 265 264.04 260 247 255 250 248  BMI 44.1 43.94 43.27 41.1 42.43 41.6 41.27   Foot/eye exam completion dates 04/13/2015 04/09/2014  Foot Form Completion Done Done      Updated lab needed at/ before next visit.   Hypertension goal BP (blood pressure) < 130/80 Uncontrolled due to non compliance, not taking amlodipine daily as she should , re educated. DASH diet and commitment to daily physical activity for a minimum of 30 minutes discussed and encouraged, as a part of hypertension management. The importance of attaining a healthy weight is also discussed.  BP/Weight 10/12/2016 09/30/2016 07/01/2016 03/10/2016 12/01/2015 11/30/2015 05/11/2015  Systolic BP 158 136 138 138 154 181 134  Diastolic BP 82 88 76 84 82 67 82  Wt. (Lbs) 265 264.04 260 247 255 250 248  BMI 44.1 43.94 43.27 41.1 42.43 41.6 41.27       Need for 23-polyvalent pneumococcal polysaccharide vaccine After obtaining informed consent, the vaccine is  administered by LPN.   Morbid obesity Deteriorated. Patient re-educated about  the importance of commitment to a  minimum of 150 minutes of exercise per week.  The importance of healthy food choices with portion control discussed. Encouraged to start a food diary, count calories and to consider  joining a support group. Sample diet sheets offered. Goals set by the patient for the next several months.   Weight /BMI 10/12/2016 09/30/2016 07/01/2016  WEIGHT 265 lb 264 lb 0.6 oz 260 lb  HEIGHT 5\' 5"  5\' 5"  5\' 5"   BMI 44.1 kg/m2 43.94 kg/m2 43.27 kg/m2      Pelvic pain in female 2 week h/o lower pelvic pain  and irregular bleeding, needs Koreau[facility] and she agrees

## 2016-10-12 NOTE — Assessment & Plan Note (Signed)
2 week h/o lower pelvic pain and irregular bleeding, needs KoreauS and she agrees

## 2016-10-12 NOTE — Patient Instructions (Signed)
F/u in 3 months, call if you need me sooner  Pneumonia 23 vaccine today  Pls schedule and have mammogram this is due  You are referred for diabetic eye exam, let Front desk staff know Doc of your choice  You are referred for pelvic ultrasound  Blood sugar is UNCONTROLLED. Need to test at LEAST twice daily and record  Increase levemir to 45 units daily, may need to go up to new dose of 50 units daily in 2 to 3 weeks, we will let you know NEED to check in with our nurse brandi weekly with blood sugar results Need to take medication and eat as you know is good for you Goal for fasting blood sugar ranges from 80 to 120 and 2 hours after any meal or at bedtime should be between 130 to 170.   BP is high , need to take all medications as prescribed, you have not been taking amlodipine. You  Need this  FASTING labs in 3 months, 1 week before follow up visit  It is important that you exercise regularly at least 30 minutes 5 times a week. If you develop chest pain, have severe difficulty breathing, or feel very tired, stop exercising immediately and seek medical attention   Thank you  for choosing [facility]. We consider it a privelige to serve you.  Delivering excellent health care in a caring and  compassionate way is our goal.  Partnering with you,  so that together we can achieve this goal is our strategy.

## 2016-10-12 NOTE — Assessment & Plan Note (Signed)
Deteriorated. Patient re-educated about  the importance of commitment to a  minimum of 150 minutes of exercise per week.  The importance of healthy food choices with portion control discussed. Encouraged to start a food diary, count calories and to consider  joining a support group. Sample diet sheets offered. Goals set by the patient for the next several months.   Weight /BMI 10/12/2016 09/30/2016 07/01/2016  WEIGHT 265 lb 264 lb 0.6 oz 260 lb  HEIGHT 5\' 5"  5\' 5"  5\' 5"   BMI 44.1 kg/m2 43.94 kg/m2 43.27 kg/m2

## 2016-10-13 ENCOUNTER — Telehealth: Payer: Self-pay

## 2016-10-14 ENCOUNTER — Other Ambulatory Visit: Payer: Self-pay

## 2016-10-14 ENCOUNTER — Other Ambulatory Visit: Payer: Self-pay | Admitting: Family Medicine

## 2016-10-14 MED ORDER — GLIPIZIDE ER 10 MG PO TB24: ORAL_TABLET | ORAL | 5 refills | Status: DC

## 2016-10-14 NOTE — Telephone Encounter (Signed)
Patient's insurance only covers insulin at a higher Tier increasing the cost.  She is asking if oral medication can be prescribed or if a generic can be prescribed.   Please advise.

## 2016-10-14 NOTE — Telephone Encounter (Signed)
When I reviewed record, decided on glipizide Er tabs instead of insulin, script entered she is to take both with breakfast, may take farxiga lunch or supper, entered historically, pls send and let her know

## 2016-10-14 NOTE — Telephone Encounter (Signed)
pls see if there is a lower cost long acting insulin on her plan , I only increased to 50 units daily same type of insulin, so doubt that it is a dose situation , but may be type of insulin

## 2016-10-14 NOTE — Telephone Encounter (Signed)
Patient aware.  Medication sent to pharmacy.  

## 2016-10-18 ENCOUNTER — Ambulatory Visit (HOSPITAL_COMMUNITY): Admission: RE | Admit: 2016-10-18 | Payer: BLUE CROSS/BLUE SHIELD | Source: Ambulatory Visit

## 2016-10-23 ENCOUNTER — Other Ambulatory Visit: Payer: Self-pay | Admitting: Family Medicine

## 2016-11-03 ENCOUNTER — Ambulatory Visit: Payer: BLUE CROSS/BLUE SHIELD | Admitting: Family Medicine

## 2017-01-11 ENCOUNTER — Ambulatory Visit: Payer: BLUE CROSS/BLUE SHIELD | Admitting: Family Medicine

## 2017-01-21 ENCOUNTER — Other Ambulatory Visit: Payer: Self-pay | Admitting: Family Medicine

## 2017-05-19 ENCOUNTER — Other Ambulatory Visit: Payer: Self-pay | Admitting: Family Medicine

## 2017-06-17 ENCOUNTER — Other Ambulatory Visit: Payer: Self-pay | Admitting: Family Medicine

## 2017-07-25 ENCOUNTER — Other Ambulatory Visit: Payer: Self-pay | Admitting: Family Medicine

## 2017-08-25 ENCOUNTER — Other Ambulatory Visit: Payer: Self-pay

## 2017-08-25 MED ORDER — GLIPIZIDE ER 10 MG PO TB24: ORAL_TABLET | ORAL | 3 refills | Status: DC

## 2017-09-04 ENCOUNTER — Other Ambulatory Visit: Payer: Self-pay | Admitting: Family Medicine

## 2017-09-04 DIAGNOSIS — Z1231 Encounter for screening mammogram for malignant neoplasm of breast: Secondary | ICD-10-CM

## 2017-09-07 ENCOUNTER — Encounter (HOSPITAL_COMMUNITY): Payer: Self-pay

## 2017-09-07 ENCOUNTER — Ambulatory Visit (HOSPITAL_COMMUNITY)
Admission: RE | Admit: 2017-09-07 | Discharge: 2017-09-07 | Disposition: A | Discharge status: Home / Self Care | Payer: BLUE CROSS/BLUE SHIELD | Source: Ambulatory Visit | Attending: Family Medicine | Admitting: Family Medicine

## 2017-09-07 DIAGNOSIS — Z1231 Encounter for screening mammogram for malignant neoplasm of breast: Secondary | ICD-10-CM | POA: Insufficient documentation

## 2017-09-22 ENCOUNTER — Telehealth: Payer: Self-pay

## 2017-09-22 ENCOUNTER — Telehealth: Payer: Self-pay | Admitting: Family Medicine

## 2017-09-22 DIAGNOSIS — E785 Hyperlipidemia, unspecified: Secondary | ICD-10-CM

## 2017-09-22 DIAGNOSIS — E669 Obesity, unspecified: Principal | ICD-10-CM

## 2017-09-22 DIAGNOSIS — E559 Vitamin D deficiency, unspecified: Secondary | ICD-10-CM

## 2017-09-22 DIAGNOSIS — E1169 Type 2 diabetes mellitus with other specified complication: Secondary | ICD-10-CM

## 2017-09-22 NOTE — Telephone Encounter (Signed)
Lab order sent to lab

## 2017-09-22 NOTE — Telephone Encounter (Signed)
Pt aware labs were ordered.

## 2017-09-22 NOTE — Telephone Encounter (Signed)
Patient called in to request blood work for her next office visit, she wants to know if she needs to fast Cb#: [phone]

## 2017-09-23 DIAGNOSIS — E669 Obesity, unspecified: Secondary | ICD-10-CM | POA: Diagnosis not present

## 2017-09-23 DIAGNOSIS — E1169 Type 2 diabetes mellitus with other specified complication: Secondary | ICD-10-CM | POA: Diagnosis not present

## 2017-09-23 DIAGNOSIS — E785 Hyperlipidemia, unspecified: Secondary | ICD-10-CM | POA: Diagnosis not present

## 2017-09-25 LAB — COMPLETE METABOLIC PANEL WITH GFR
AG RATIO: 1.5 (calc) (ref 1.0–2.5)
ALBUMIN MSPROF: 4.1 g/dL (ref 3.6–5.1)
ALKALINE PHOSPHATASE (APISO): 56 U/L (ref 33–115)
ALT: 15 U/L (ref 6–29)
AST: 11 U/L (ref 10–35)
BILIRUBIN TOTAL: 0.5 mg/dL (ref 0.2–1.2)
BUN: 14 mg/dL (ref 7–25)
CO2: 25 mmol/L (ref 20–32)
CREATININE: 0.78 mg/dL (ref 0.50–1.10)
Calcium: 9.3 mg/dL (ref 8.6–10.2)
Chloride: 103 mmol/L (ref 98–110)
GFR, Est African American: 105 mL/min/{1.73_m2} (ref >=60)
GFR, Est Non African American: 91 mL/min/{1.73_m2} (ref >=60)
GLOBULIN: 2.8 g/dL (ref 1.9–3.7)
Glucose, Bld: 255 mg/dL — ABNORMAL HIGH (ref 65–99)
POTASSIUM: 4.5 mmol/L (ref 3.5–5.3)
SODIUM: 136 mmol/L (ref 135–146)
Total Protein: 6.9 g/dL (ref 6.1–8.1)

## 2017-09-25 LAB — VITAMIN D 25 HYDROXY (VIT D DEFICIENCY, FRACTURES): VIT D 25 HYDROXY: 18 ng/mL — AB (ref 30–100)

## 2017-09-25 LAB — LIPID PANEL
CHOL/HDL RATIO: 3.6 (calc) (ref <5.0)
CHOLESTEROL: 248 mg/dL — AB (ref <200)
HDL: 68 mg/dL (ref >50)
LDL CHOLESTEROL (CALC): 153 mg/dL — AB
Non-HDL Cholesterol (Calc): 180 mg/dL (calc) — ABNORMAL HIGH (ref <130)
TRIGLYCERIDES: 138 mg/dL (ref <150)

## 2017-09-25 LAB — HEMOGLOBIN A1C
Hgb A1c MFr Bld: 10.4 % of total Hgb — ABNORMAL HIGH (ref <5.7)
Mean Plasma Glucose: 252 (calc)
eAG (mmol/L): 13.9 (calc)

## 2017-09-26 ENCOUNTER — Encounter: Payer: Self-pay | Admitting: Family Medicine

## 2017-09-29 ENCOUNTER — Telehealth: Payer: Self-pay | Admitting: Family Medicine

## 2017-09-29 NOTE — Telephone Encounter (Signed)
Called [phone] no answer, left voicemail for patient to make an appt per Dr.Simpson

## 2017-11-01 ENCOUNTER — Other Ambulatory Visit: Payer: Self-pay | Admitting: Family Medicine

## 2017-11-14 ENCOUNTER — Ambulatory Visit: Payer: BLUE CROSS/BLUE SHIELD | Admitting: Family Medicine

## 2017-11-14 ENCOUNTER — Encounter: Payer: Self-pay | Admitting: Family Medicine

## 2017-11-14 VITALS — BP 180/90 | HR 89 | Resp 16 | Ht 65.0 in | Wt 266.0 lb

## 2017-11-14 DIAGNOSIS — E785 Hyperlipidemia, unspecified: Secondary | ICD-10-CM | POA: Diagnosis not present

## 2017-11-14 DIAGNOSIS — I10 Essential (primary) hypertension: Secondary | ICD-10-CM

## 2017-11-14 DIAGNOSIS — Z23 Encounter for immunization: Secondary | ICD-10-CM

## 2017-11-14 DIAGNOSIS — E1169 Type 2 diabetes mellitus with other specified complication: Secondary | ICD-10-CM

## 2017-11-14 DIAGNOSIS — E559 Vitamin D deficiency, unspecified: Secondary | ICD-10-CM | POA: Diagnosis not present

## 2017-11-14 DIAGNOSIS — E669 Obesity, unspecified: Secondary | ICD-10-CM | POA: Diagnosis not present

## 2017-11-14 MED ORDER — ROSUVASTATIN CALCIUM 20 MG PO TABS: 20.0000 mg | ORAL_TABLET | Freq: Every day | ORAL | 1 refills | Status: DC

## 2017-11-14 MED ORDER — SPIRONOLACTONE 25 MG PO TABS: 25.0000 mg | ORAL_TABLET | Freq: Every day | ORAL | 1 refills | Status: DC

## 2017-11-14 MED ORDER — GLIPIZIDE ER 10 MG PO TB24: ORAL_TABLET | ORAL | 1 refills | Status: DC

## 2017-11-14 MED ORDER — AMLODIPINE BESYLATE 10 MG PO TABS: 10.0000 mg | ORAL_TABLET | Freq: Every day | ORAL | 1 refills | Status: DC

## 2017-11-14 NOTE — Assessment & Plan Note (Signed)
After obtaining informed consent, the vaccine is  administered by LPN.  

## 2017-11-14 NOTE — Assessment & Plan Note (Signed)
Deteriorated. Patient re-educated about  the importance of commitment to a  minimum of 150 minutes of exercise per week.  The importance of healthy food choices with portion control discussed. Encouraged to start a food diary, count calories and to consider  joining a support group. Sample diet sheets offered. Goals set by the patient for the next several months.   Weight /BMI 11/14/2017 10/12/2016 09/30/2016  WEIGHT 266 lb 265 lb 264 lb 0.6 oz  HEIGHT 5\' 5"  5\' 5"  5\' 5"   BMI 44.26 kg/m2 44.1 kg/m2 43.94 kg/m2   Info provided o bariiatric surgery

## 2017-11-14 NOTE — Patient Instructions (Addendum)
Nurse BP check 3rd week in March  MD f/u last week in April   Fasting lipid, cmp and EGFr, HBa1C, TSH, cBC, Vit D 1 week before follow up   Goal for fasting blood sugar ranges from 80 to 120 and 2 hours after any meal or at bedtime should be between 130 to 170.  Microalb from office today   Flu vaccine today  Info on bariatric surgery  At checkout

## 2017-11-14 NOTE — Assessment & Plan Note (Signed)
Uncontrolled when last checked. Tested in office today and excellent reading Based on new eating and exercise plan e, pt shows marked improvement , will get hBa1C when next due to see what this is NEEDS to start testing regulalrly Glenda Thompson is reminded of the importance of commitment to daily physical activity for 30 minutes or more, as able and the need to limit carbohydrate intake to 30 to 60 grams per meal to help with blood sugar control.   The need to take medication as prescribed, test blood sugar as directed, and to call between visits if there is a concern that blood sugar is uncontrolled is also discussed.   Glenda Thompson is reminded of the importance of daily foot exam, annual eye examination, and good blood sugar, blood pressure and cholesterol control.  Diabetic Labs Latest Ref Rng & Units 09/23/2017 09/30/2016 03/08/2016 12/01/2015 04/13/2015  HbA1c <5.7 % of total Hgb 10.4(H) 9.0(H) 7.6(H) 10.0(H) -  Microalbumin <2.0 mg/dL - - - - <0.3<0.2  Micro/Creat Ratio 0.0 - 30.0 mg/g - - - - SEE NOTE  Chol <200 mg/dL 474(Q248(H) - - - -  HDL >59>50 mg/dL 68 - - - -  Calc LDL 0 - 99 mg/dL - - - - -  Triglycerides <150 mg/dL 563138 - - - -  Creatinine 0.50 - 1.10 mg/dL 8.750.78 6.430.89 3.291.06 5.180.86 -   BP/Weight 11/14/2017 10/12/2016 09/30/2016 07/01/2016 03/10/2016 12/01/2015 11/30/2015  Systolic BP 180 158 136 138 138 154 181  Diastolic BP 90 82 88 76 84 82 67  Wt. (Lbs) 266 265 264.04 260 247 255 250  BMI 44.26 44.1 43.94 43.27 41.1 42.43 41.6   Foot/eye exam completion dates 11/14/2017 10/12/2016  Foot Form Completion Done Done

## 2017-11-14 NOTE — Assessment & Plan Note (Signed)
Uncontrolled due to non compliance , only taking one of 3 prescribed meds, needs to rsume the other 2 and retur for nurse BP check in3 weeks DASH diet and commitment to daily physical activity for a minimum of 30 minutes discussed and encouraged, as a part of hypertension management. The importance of attaining a healthy weight is also discussed.  BP/Weight 11/14/2017 10/12/2016 09/30/2016 07/01/2016 03/10/2016 12/01/2015 11/30/2015  Systolic BP 180 158 136 138 138 154 181  Diastolic BP 90 82 88 76 84 82 67  Wt. (Lbs) 266 265 264.04 260 247 255 250  BMI 44.26 44.1 43.94 43.27 41.1 42.43 41.6

## 2017-11-14 NOTE — Assessment & Plan Note (Signed)
Uncontrolled and not at goal Hyperlipidemia:Low fat diet discussed and encouraged.   Lipid Panel  Lab Results  Component Value Date   CHOL 248 (H) 09/23/2017   HDL 68 09/23/2017   LDLCALC 99 10/02/2014   TRIG 138 09/23/2017   CHOLHDL 3.6 09/23/2017     Start crestor 20 mg daily

## 2017-11-14 NOTE — Progress Notes (Signed)
Glenda Thompson     MRN: [id number]      DOB: 04-20-1971   HPI Glenda Thompson is here for follow up and re-evaluation of chronic medical conditions, medication management and review of any available recent lab and radiology data.  Preventive health is updated, specifically  Cancer screening and Immunization.   . The PT states she hates going to the Doctor and uses this as the explanation as to why she has not been in for over 1 year despite knowing that her blood sugar and blood pressure were uncontrolled at her last visit and she has also not been testing blood sugar nor has she been taking her blood pressure meds. States her wake up call came when a close family member ended up on life support because of complications of poorly treated diabetes and other medical illnesses. She changed her diet and exercise program, still is not testing, states feels better , this started in January, and she is now interested in bariatric as a means of curing her of her chronic illnesses   ROS Denies recent fever or chills. Denies sinus pressure, nasal congestion, ear pain or sore throat. Denies chest congestion, productive cough or wheezing. Denies chest pains, palpitations and leg swelling Denies abdominal pain, nausea, vomiting,diarrhea or constipation.   Denies dysuria, frequency, hesitancy or incontinence. Denies joint pain, swelling and limitation in mobility. Denies headaches, seizures, numbness, or tingling. Denies depression, anxiety or insomnia. Denies skin break down or rash.   PE  BP (!) 180/90   Pulse 89   Resp 16   Ht 5\' 5"  (1.651 m)   Wt 266 lb (120.7 kg)   SpO2 98%   BMI 44.26 kg/m   Patient alert and oriented and in no cardiopulmonary distress.  HEENT: No facial asymmetry, EOMI,   oropharynx pink and moist.  Neck supple no JVD, no mass.  Chest: Clear to auscultation bilaterally.  CVS: S1, S2 no murmurs, no S3.Regular rate.  ABD: Soft non tender.   Ext: No edema  MS:  Adequate ROM spine, shoulders, hips and knees.  Skin: Intact, no ulcerations or rash noted.  Psych: Good eye contact, normal affect. Memory intact not anxious or depressed appearing.  CNS: CN 2-12 intact, power,  normal throughout.no focal deficits noted.   Assessment & Plan  Diabetes mellitus type 2 in obese Uncontrolled when last checked. Tested in office today and excellent reading Based on new eating and exercise plan e, pt shows marked improvement , will get hBa1C when next due to see what this is NEEDS to start testing regulalrly Glenda Thompson is reminded of the importance of commitment to daily physical activity for 30 minutes or more, as able and the need to limit carbohydrate intake to 30 to 60 grams per meal to help with blood sugar control.   The need to take medication as prescribed, test blood sugar as directed, and to call between visits if there is a concern that blood sugar is uncontrolled is also discussed.   Glenda Thompson is reminded of the importance of daily foot exam, annual eye examination, and good blood sugar, blood pressure and cholesterol control.  Diabetic Labs Latest Ref Rng & Units 09/23/2017 09/30/2016 03/08/2016 12/01/2015 04/13/2015  HbA1c <5.7 % of total Hgb 10.4(H) 9.0(H) 7.6(H) 10.0(H) -  Microalbumin <2.0 mg/dL - - - - <2.1  Micro/Creat Ratio 0.0 - 30.0 mg/g - - - - SEE NOTE  Chol <200 mg/dL 308(M) - - - -  HDL >57 mg/dL  68 - - - -  Calc LDL 0 - 99 mg/dL - - - - -  Triglycerides <150 mg/dL 086138 - - - -  Creatinine 0.50 - 1.10 mg/dL 5.780.78 4.690.89 6.291.06 5.280.86 -   BP/Weight 11/14/2017 10/12/2016 09/30/2016 07/01/2016 03/10/2016 12/01/2015 11/30/2015  Systolic BP 180 158 136 138 138 154 181  Diastolic BP 90 82 88 76 84 82 67  Wt. (Lbs) 266 265 264.04 260 247 255 250  BMI 44.26 44.1 43.94 43.27 41.1 42.43 41.6   Foot/eye exam completion dates 11/14/2017 10/12/2016  Foot Form Completion Done Done        Hypertension goal BP (blood pressure) < 130/80 Uncontrolled  due to non compliance , only taking one of 3 prescribed meds, needs to rsume the other 2 and retur for nurse BP check in3 weeks DASH diet and commitment to daily physical activity for a minimum of 30 minutes discussed and encouraged, as a part of hypertension management. The importance of attaining a healthy weight is also discussed.  BP/Weight 11/14/2017 10/12/2016 09/30/2016 07/01/2016 03/10/2016 12/01/2015 11/30/2015  Systolic BP 180 158 136 138 138 154 181  Diastolic BP 90 82 88 76 84 82 67  Wt. (Lbs) 266 265 264.04 260 247 255 250  BMI 44.26 44.1 43.94 43.27 41.1 42.43 41.6       Need for prophylactic vaccination and inoculation against influenza After obtaining informed consent, the vaccine is  administered by LPN.   Morbid obesity Deteriorated. Patient re-educated about  the importance of commitment to a  minimum of 150 minutes of exercise per week.  The importance of healthy food choices with portion control discussed. Encouraged to start a food diary, count calories and to consider  joining a support group. Sample diet sheets offered. Goals set by the patient for the next several months.   Weight /BMI 11/14/2017 10/12/2016 09/30/2016  WEIGHT 266 lb 265 lb 264 lb 0.6 oz  HEIGHT 5\' 5"  5\' 5"  5\' 5"   BMI 44.26 kg/m2 44.1 kg/m2 43.94 kg/m2   Info provided o bariiatric surgery   Hyperlipidemia LDL goal <100 Uncontrolled and not at goal Hyperlipidemia:Low fat diet discussed and encouraged.   Lipid Panel  Lab Results  Component Value Date   CHOL 248 (H) 09/23/2017   HDL 68 09/23/2017   LDLCALC 99 10/02/2014   TRIG 138 09/23/2017   CHOLHDL 3.6 09/23/2017     Start crestor 20 mg daily

## 2017-11-15 ENCOUNTER — Other Ambulatory Visit (HOSPITAL_COMMUNITY)
Admission: RE | Admit: 2017-11-15 | Discharge: 2017-11-15 | Disposition: A | Discharge status: Home / Self Care | Payer: BLUE CROSS/BLUE SHIELD | Source: Ambulatory Visit | Attending: Family Medicine | Admitting: Family Medicine

## 2017-11-15 DIAGNOSIS — E669 Obesity, unspecified: Secondary | ICD-10-CM | POA: Insufficient documentation

## 2017-11-15 DIAGNOSIS — Z23 Encounter for immunization: Secondary | ICD-10-CM | POA: Diagnosis not present

## 2017-11-15 DIAGNOSIS — E119 Type 2 diabetes mellitus without complications: Secondary | ICD-10-CM | POA: Diagnosis present

## 2017-11-15 DIAGNOSIS — Z6841 Body Mass Index (BMI) 40.0 and over, adult: Secondary | ICD-10-CM | POA: Insufficient documentation

## 2017-11-16 LAB — MICROALBUMIN / CREATININE URINE RATIO
CREATININE, UR: 32.1 mg/dL
Microalb Creat Ratio: <9.3 mg/g creat (ref 0.0–30.0)
Microalb, Ur: <3 ug/mL — ABNORMAL HIGH

## 2017-11-22 ENCOUNTER — Other Ambulatory Visit: Payer: Self-pay | Admitting: Family Medicine

## 2017-11-22 ENCOUNTER — Telehealth: Payer: Self-pay | Admitting: Family Medicine

## 2017-11-22 MED ORDER — METFORMIN HCL 500 MG PO TABS: 500.0000 mg | ORAL_TABLET | Freq: Every day | ORAL | 3 refills | Status: DC

## 2017-11-22 NOTE — Telephone Encounter (Signed)
Patient is requesting her Glipizide medication to be increased. Says she cant regulate her sugar by diet. Cb#: [phone]

## 2017-11-22 NOTE — Telephone Encounter (Signed)
I spoke with the pt, she is already on max dose of glipizide, will add metformin one daily, this is sent to her pharmacy

## 2017-12-14 ENCOUNTER — Other Ambulatory Visit: Payer: Self-pay

## 2017-12-14 MED ORDER — GLUCOSE BLOOD VI STRP: ORAL_STRIP | 5 refills | Status: DC

## 2017-12-14 MED ORDER — FREESTYLE LANCETS MISC: 5 refills | Status: DC

## 2018-01-08 ENCOUNTER — Ambulatory Visit: Payer: BLUE CROSS/BLUE SHIELD

## 2018-01-08 ENCOUNTER — Telehealth: Payer: Self-pay

## 2018-01-08 VITALS — BP 150/84

## 2018-01-08 DIAGNOSIS — E1169 Type 2 diabetes mellitus with other specified complication: Secondary | ICD-10-CM

## 2018-01-08 DIAGNOSIS — E785 Hyperlipidemia, unspecified: Secondary | ICD-10-CM

## 2018-01-08 DIAGNOSIS — I10 Essential (primary) hypertension: Secondary | ICD-10-CM

## 2018-01-08 DIAGNOSIS — E669 Obesity, unspecified: Principal | ICD-10-CM

## 2018-01-08 MED ORDER — FLUTICASONE PROPIONATE 50 MCG/ACT NA SUSP: 2.0000 | Freq: Every day | NASAL | 12 refills | Status: DC

## 2018-01-08 NOTE — Progress Notes (Signed)
BP improved from the last visit but still elevated. Advised we would call her after dr reviewed. Continue same meds

## 2018-01-08 NOTE — Telephone Encounter (Signed)
Patient requesting to discontinue the glipizide and increase the metformin to 2 daily instead. Please advise

## 2018-01-08 NOTE — Telephone Encounter (Signed)
Needs hBA1C, fasting lipid , cmp and eGR before I change, can be done now and needs to be done  Explain to her no change before ai see labs , past due

## 2018-01-10 NOTE — Addendum Note (Signed)
Addended by: Abner GreenspanHUDY, Dannelle Rhymes H on: 01/10/2018 12:41 PM   Modules accepted: Orders

## 2018-01-10 NOTE — Telephone Encounter (Signed)
Message left for pt that labs needed to be done before meds could be changed. Advised to come collect the order on her way to the lab

## 2018-01-26 ENCOUNTER — Other Ambulatory Visit: Payer: Self-pay | Admitting: Family Medicine

## 2018-02-14 ENCOUNTER — Ambulatory Visit: Payer: BLUE CROSS/BLUE SHIELD | Admitting: Family Medicine

## 2018-03-01 ENCOUNTER — Ambulatory Visit: Payer: BLUE CROSS/BLUE SHIELD | Admitting: Family Medicine

## 2018-03-11 ENCOUNTER — Other Ambulatory Visit: Payer: Self-pay | Admitting: Family Medicine

## 2018-03-30 ENCOUNTER — Other Ambulatory Visit: Payer: Self-pay | Admitting: Family Medicine

## 2018-03-30 MED ORDER — METFORMIN HCL ER 500 MG PO TB24: 500.0000 mg | ORAL_TABLET | Freq: Every day | ORAL | 1 refills | Status: DC

## 2018-04-02 ENCOUNTER — Telehealth: Payer: Self-pay

## 2018-04-02 NOTE — Telephone Encounter (Signed)
Left vm requesting call back.   Per Dr.Simpson patient needs to be seen and have labs drawn. She hasn't had lab work or been seen since Dec so only 2 mths worth of Metformin was sent in to the pharmacy.

## 2018-04-03 NOTE — Telephone Encounter (Signed)
Patient lvm returning your call, I called her back & left voicemail with the information from your message.

## 2018-04-04 ENCOUNTER — Telehealth: Payer: Self-pay | Admitting: Family Medicine

## 2018-04-04 NOTE — Telephone Encounter (Signed)
Cant afford to do the Blood work at this time.,can you up the metformin to --2 a day  Also this needs to be called in to [facility] [address]

## 2018-04-06 NOTE — Telephone Encounter (Signed)
I ar least need to see HBA1C chem 7 and EGFR before I can make any change in her metformin

## 2018-04-06 NOTE — Telephone Encounter (Signed)
Left message that those 2 needed to be done before med was changed

## 2018-04-22 ENCOUNTER — Other Ambulatory Visit: Payer: Self-pay | Admitting: Family Medicine

## 2018-04-25 ENCOUNTER — Other Ambulatory Visit: Payer: Self-pay | Admitting: Family Medicine

## 2018-04-28 DIAGNOSIS — E119 Type 2 diabetes mellitus without complications: Secondary | ICD-10-CM | POA: Diagnosis not present

## 2018-05-12 ENCOUNTER — Emergency Department (HOSPITAL_COMMUNITY): Payer: BLUE CROSS/BLUE SHIELD

## 2018-05-12 ENCOUNTER — Encounter (HOSPITAL_COMMUNITY): Payer: Self-pay

## 2018-05-12 ENCOUNTER — Emergency Department (HOSPITAL_COMMUNITY)
Admission: EM | Admit: 2018-05-12 | Discharge: 2018-05-12 | Disposition: A | Discharge status: Home / Self Care | Payer: BLUE CROSS/BLUE SHIELD | Attending: Emergency Medicine | Admitting: Emergency Medicine

## 2018-05-12 ENCOUNTER — Other Ambulatory Visit: Payer: Self-pay

## 2018-05-12 DIAGNOSIS — I10 Essential (primary) hypertension: Secondary | ICD-10-CM | POA: Diagnosis not present

## 2018-05-12 DIAGNOSIS — R0789 Other chest pain: Secondary | ICD-10-CM | POA: Insufficient documentation

## 2018-05-12 DIAGNOSIS — E1165 Type 2 diabetes mellitus with hyperglycemia: Secondary | ICD-10-CM | POA: Diagnosis not present

## 2018-05-12 DIAGNOSIS — R079 Chest pain, unspecified: Secondary | ICD-10-CM | POA: Diagnosis not present

## 2018-05-12 DIAGNOSIS — Z79899 Other long term (current) drug therapy: Secondary | ICD-10-CM | POA: Insufficient documentation

## 2018-05-12 DIAGNOSIS — R739 Hyperglycemia, unspecified: Secondary | ICD-10-CM

## 2018-05-12 DIAGNOSIS — Z7984 Long term (current) use of oral hypoglycemic drugs: Secondary | ICD-10-CM | POA: Diagnosis not present

## 2018-05-12 DIAGNOSIS — R002 Palpitations: Secondary | ICD-10-CM | POA: Diagnosis not present

## 2018-05-12 LAB — BASIC METABOLIC PANEL
Anion gap: 7 (ref 5–15)
BUN: 21 mg/dL — ABNORMAL HIGH (ref 6–20)
CALCIUM: 9.2 mg/dL (ref 8.9–10.3)
CHLORIDE: 100 mmol/L (ref 98–111)
CO2: 28 mmol/L (ref 22–32)
CREATININE: 0.95 mg/dL (ref 0.44–1.00)
GFR calc non Af Amer: >60 mL/min (ref >60)
Glucose, Bld: 373 mg/dL — ABNORMAL HIGH (ref 70–99)
Potassium: 4 mmol/L (ref 3.5–5.1)
SODIUM: 135 mmol/L (ref 135–145)

## 2018-05-12 LAB — CBC WITH DIFFERENTIAL/PLATELET
Basophils Absolute: 0 10*3/uL (ref 0.0–0.1)
Basophils Relative: 0 %
EOS ABS: 0.1 10*3/uL (ref 0.0–0.7)
Eosinophils Relative: 2 %
HCT: 37.5 % (ref 36.0–46.0)
HEMOGLOBIN: 12.3 g/dL (ref 12.0–15.0)
LYMPHS ABS: 2.3 10*3/uL (ref 0.7–4.0)
Lymphocytes Relative: 34 %
MCH: 27.7 pg (ref 26.0–34.0)
MCHC: 32.8 g/dL (ref 30.0–36.0)
MCV: 84.5 fL (ref 78.0–100.0)
Monocytes Absolute: 0.2 10*3/uL (ref 0.1–1.0)
Monocytes Relative: 3 %
NEUTROS ABS: 4.1 10*3/uL (ref 1.7–7.7)
NEUTROS PCT: 61 %
PLATELETS: 213 10*3/uL (ref 150–400)
RBC: 4.44 MIL/uL (ref 3.87–5.11)
RDW: 12.5 % (ref 11.5–15.5)
WBC: 6.7 10*3/uL (ref 4.0–10.5)

## 2018-05-12 LAB — HEMOGLOBIN A1C
Hgb A1c MFr Bld: 11.7 % — ABNORMAL HIGH (ref 4.8–5.6)
MEAN PLASMA GLUCOSE: 289.09 mg/dL

## 2018-05-12 LAB — TROPONIN I: Troponin I: <0.03 ng/mL (ref <0.03)

## 2018-05-12 NOTE — ED Provider Notes (Signed)
[facility] EMERGENCY DEPARTMENT Provider Note   CSN: [id number] Arrival date & time: 05/12/18  1028     History   Chief Complaint Chief Complaint  Patient presents with  . heart fluttering    HPI Glenda Thompson is a 47 y.o. female.  She is complaining of chest palpitations for the last couple of days.  She notices mostly at night although she was experiencing them today and decided to get it checked out.  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  She states she is been doing this for a long time although she is recently moved up to first shift where she is doing it more quickly.  She denies that the fluttering is painful and does not associate with any shortness of breath fevers or cough.  She has no prior history of cardiac disease but does run in her family.  The history is provided by the patient.  Palpitations   This is a new problem. The current episode started more than 2 days ago. The problem occurs daily. The problem has not changed since onset.Pertinent negatives include no diaphoresis, no fever, no chest pain, no chest pressure, no near-syncope, no syncope, no abdominal pain, no nausea, no vomiting, no back pain, no hemoptysis and no shortness of breath. She has tried nothing for the symptoms. Risk factors include family history and diabetes mellitus.    Past Medical History:  Diagnosis Date  . Diabetes mellitus   . Hypertension     Patient Active Problem List   Diagnosis Date Noted  . Need for 23-polyvalent pneumococcal polysaccharide vaccine 10/12/2016  . Need for prophylactic vaccination and inoculation against influenza 06/30/2014  . Vitamin D deficiency 10/13/2011  . FATIGUE 09/24/2008  . Hyperlipidemia LDL goal <100 09/06/2008  . Diabetes mellitus type 2 in obese (HCC) 05/29/2008  . Morbid obesity (HCC) 05/29/2008  . Hypertension goal BP (blood pressure) < 130/80 05/29/2008    Past Surgical History:    Procedure Laterality Date  . CESAREAN SECTION       OB History   None      Home Medications    Prior to Admission medications   Medication Sig Start Date End Date Taking? Authorizing Provider  amLODipine (NORVASC) 10 MG tablet Take 1 tablet (10 mg total) by mouth daily. 11/14/17   Kerri Perches, MD  fluticasone (FLONASE) 50 MCG/ACT nasal spray Place 2 sprays into both nostrils daily. 01/08/18   Kerri Perches, MD  glipiZIDE (GLUCOTROL XL) 10 MG 24 hr tablet Two tablets once daily at breakfast 11/14/17   Kerri Perches, MD  glucose blood (FREESTYLE LITE) test strip USE TO TEST THREE TIMES DAILY 12/14/17   Kerri Perches, MD  Lancets (FREESTYLE) lancets TEST THREE TIMES DAILY 12/14/17   Kerri Perches, MD  lisinopril-hydrochlorothiazide (PRINZIDE,ZESTORETIC) 20-12.5 MG tablet TAKE 2 TABLETS BY MOUTH DAILY 04/25/18   Kerri Perches, MD  metFORMIN (GLUCOPHAGE) 500 MG tablet TAKE 1 TABLET BY MOUTH IN THE MORNING WITH BREAKFAST 04/23/18   Kerri Perches, MD  metFORMIN (GLUCOPHAGE-XR) 500 MG 24 hr tablet Take 1 tablet (500 mg total) by mouth daily with breakfast. 03/30/18   Kerri Perches, MD  rosuvastatin (CRESTOR) 20 MG tablet Take 1 tablet (20 mg total) by mouth daily. 11/14/17   Kerri Perches, MD  spironolactone (ALDACTONE) 25 MG tablet Take 1 tablet (25 mg total) by mouth daily. 11/14/17  Kerri PerchesSimpson, Margaret E, MD    Family History Family History  Problem Relation Age of Onset  . Hypertension Mother   . Diabetes Mother   . Diabetes Brother     Social History Social History   Tobacco Use  . Smoking status: Never Smoker  . Smokeless tobacco: Never Used  Substance Use Topics  . Alcohol use: Never    Frequency: Never  . Drug use: Not on file     Allergies   Metformin and related   Review of Systems Review of Systems  Constitutional: Negative for diaphoresis and fever.  HENT: Negative for sore throat.   Eyes: Negative for visual  disturbance.  Respiratory: Negative for hemoptysis and shortness of breath.   Cardiovascular: Positive for palpitations. Negative for chest pain, syncope and near-syncope.  Gastrointestinal: Negative for abdominal pain, nausea and vomiting.  Genitourinary: Negative for dysuria.  Musculoskeletal: Negative for back pain and neck pain.  Skin: Negative for rash.     Physical Exam Updated Vital Signs BP (!) 173/65 (BP Location: Right Arm)   Pulse 88   Temp 98.7 F (37.1 C) (Oral)   Resp 18   Ht 5\' 5"  (1.651 m)   Wt 118.8 kg (262 lb)   LMP 12/07/2017 Comment: pt says thinks is premenopausal  SpO2 99%   BMI 43.60 kg/m   Physical Exam  Constitutional: She appears well-developed and well-nourished. No distress.  HENT:  Head: Normocephalic and atraumatic.  Eyes: Conjunctivae are normal.  Neck: Neck supple.  Cardiovascular: Normal rate, regular rhythm, normal heart sounds and intact distal pulses.  No murmur heard. Pulmonary/Chest: Effort normal and breath sounds normal. No respiratory distress.  He has some reproducible tenderness with movement of her right arm and palpation of her right pectoral area.  Abdominal: Soft. There is no tenderness.  Musculoskeletal: Normal range of motion. She exhibits no edema or deformity.  Neurological: She is alert.  Skin: Skin is warm and dry. Capillary refill takes less than 2 seconds.  Psychiatric: She has a normal mood and affect.  Nursing note and vitals reviewed.    ED Treatments / Results  Labs (all labs ordered are listed, but only abnormal results are displayed) Labs Reviewed  BASIC METABOLIC PANEL - Abnormal; Notable for the following components:      Result Value   Glucose, Bld 373 (*)    BUN 21 (*)    All other components within normal limits  HEMOGLOBIN A1C - Abnormal; Notable for the following components:   Hgb A1c MFr Bld 11.7 (*)    All other components within normal limits  CBC WITH DIFFERENTIAL/PLATELET  TROPONIN I     EKG EKG Interpretation  Date/Time:  Saturday May 12 2018 10:49:55 EDT Ventricular Rate:  76 PR Interval:    QRS Duration: 101 QT Interval:  413 QTC Calculation: 465 R Axis:   10 Text Interpretation:  Sinus rhythm Probable left atrial enlargement Left ventricular hypertrophy no prior to compare with Confirmed by Meridee ScoreButler, Remy Voiles [id number]) on 05/12/2018 11:20:56 AM   Radiology Dg Chest 2 View  Result Date: 05/12/2018 CLINICAL DATA:  Right chest pain. Chest fluttering sensation followed by the patient. EXAM: CHEST - 2 VIEW COMPARISON:  12/01/2015. FINDINGS: Normal sized heart. Clear lungs with normal vascularity. Mild scoliosis. IMPRESSION: No acute abnormality. Electronically Signed   By: Beckie SaltsSteven  Reid M.D.   On: 05/12/2018 12:38    Procedures Procedures (including critical care time)  Medications Ordered in ED Medications - No data to display  Initial Impression / Assessment and Plan / ED Course  I have reviewed the triage vital signs and the nursing notes.  Pertinent labs & imaging results that were available during my care of the patient were reviewed by me and considered in my medical decision making (see chart for details).      Final Clinical Impressions(s) / ED Diagnoses   Final diagnoses:  Palpitations  Chest wall pain  Hyperglycemia    ED Discharge Orders    None       Terrilee Files, MD 05/13/18 1758

## 2018-05-12 NOTE — Discharge Instructions (Addendum)
You were evaluated in the emergency department for some right-sided chest wall pain and fluttering sensation.  You had blood work and a chest x-ray and an EKG that did not show any obvious signs of any heart injury.  Your blood sugar was elevated and this will need to be followed up with your doctor.  This chest pain is likely musculoskeletal and you can take Tylenol or ibuprofen for it.

## 2018-05-12 NOTE — ED Triage Notes (Signed)
Pt reports feeling heart flutter at night while resting.  Pt denies pain but says she gets a little nauseated when she feels the fluttering sensation.

## 2018-05-14 ENCOUNTER — Telehealth: Payer: Self-pay | Admitting: Family Medicine

## 2018-05-14 DIAGNOSIS — E669 Obesity, unspecified: Principal | ICD-10-CM

## 2018-05-14 DIAGNOSIS — E1169 Type 2 diabetes mellitus with other specified complication: Secondary | ICD-10-CM

## 2018-05-14 NOTE — Telephone Encounter (Signed)
Patient is requesting prescription for her sugar levels. [facility] Cb#: [phone]

## 2018-05-14 NOTE — Telephone Encounter (Signed)
Blood sugar has increased even moe,I revcommend sghe see a diabetes specialist, her hBA1C is over 11, I am happy to and recommend any endo she wishes. Dr Fransico HimNida is inn [place] and I recommend him,  She needs to let me knwo so I can refer  She has no appt in the system here for f/u , not suere why but also needs to make an appt

## 2018-05-17 NOTE — Telephone Encounter (Signed)
Message left on personal voicemail to call back with the endo she would like referral to and to schedule an appt with MD when she calls back. Will also mail a letter to this effect as well

## 2018-05-21 ENCOUNTER — Ambulatory Visit: Payer: BLUE CROSS/BLUE SHIELD | Admitting: Family Medicine

## 2018-05-21 ENCOUNTER — Other Ambulatory Visit: Payer: Self-pay

## 2018-05-21 ENCOUNTER — Encounter: Payer: Self-pay | Admitting: Family Medicine

## 2018-05-21 VITALS — BP 142/70 | HR 93 | Ht 65.0 in | Wt 253.1 lb

## 2018-05-21 DIAGNOSIS — I10 Essential (primary) hypertension: Secondary | ICD-10-CM

## 2018-05-21 DIAGNOSIS — E1169 Type 2 diabetes mellitus with other specified complication: Secondary | ICD-10-CM | POA: Diagnosis not present

## 2018-05-21 DIAGNOSIS — E669 Obesity, unspecified: Secondary | ICD-10-CM

## 2018-05-21 DIAGNOSIS — E785 Hyperlipidemia, unspecified: Secondary | ICD-10-CM | POA: Diagnosis not present

## 2018-05-21 MED ORDER — INSULIN GLARGINE 100 UNIT/ML SOLOSTAR PEN: PEN_INJECTOR | SUBCUTANEOUS | 99 refills | Status: DC

## 2018-05-21 MED ORDER — METFORMIN HCL 500 MG PO TABS: 500.0000 mg | ORAL_TABLET | Freq: Every day | ORAL | 3 refills | Status: DC

## 2018-05-21 MED ORDER — GLIPIZIDE 10 MG PO TABS: 10.0000 mg | ORAL_TABLET | Freq: Two times a day (BID) | ORAL | 3 refills | Status: DC

## 2018-05-21 NOTE — Patient Instructions (Addendum)
F/U in 6 week, with blood sugar log  New is lantus 15 units continue other medications ,  And log 3 times daily blood sugars  Fasting 80 to 130  2 hrs after lunch and bedtime : 130 to 180 Call with questions about med dosing between visit , send messages     Fasting lipid, hepatic 5 days before next visit  You are referred for diabetic education and will get log sheet today also, also you will get help with logging into my chart

## 2018-05-22 ENCOUNTER — Telehealth: Payer: Self-pay | Admitting: Family Medicine

## 2018-05-22 NOTE — Telephone Encounter (Signed)
[facility]antus is not covered at [facility]-- [facility] was going to send over a fax of what they would pay for .Marland Kitchen..Marland Kitchen

## 2018-05-23 ENCOUNTER — Encounter: Payer: Self-pay | Admitting: Family Medicine

## 2018-05-23 NOTE — Telephone Encounter (Signed)
Left message requesting  call back.

## 2018-05-24 ENCOUNTER — Other Ambulatory Visit: Payer: Self-pay

## 2018-05-24 MED ORDER — INSULIN GLARGINE 100 UNIT/ML SOLOSTAR PEN: PEN_INJECTOR | SUBCUTANEOUS | 5 refills | Status: DC

## 2018-05-25 ENCOUNTER — Encounter: Payer: Self-pay | Admitting: Family Medicine

## 2018-05-25 ENCOUNTER — Other Ambulatory Visit: Payer: Self-pay

## 2018-05-25 MED ORDER — INSULIN DETEMIR 100 UNIT/ML FLEXPEN: 25.0000 [IU] | PEN_INJECTOR | Freq: Every day | SUBCUTANEOUS | 11 refills | Status: DC

## 2018-05-25 NOTE — Telephone Encounter (Signed)
Left information on what is covered in Dr.Simpson's basket.

## 2018-05-28 ENCOUNTER — Other Ambulatory Visit: Payer: Self-pay

## 2018-05-28 MED ORDER — INSULIN DETEMIR 100 UNIT/ML FLEXPEN: 25.0000 [IU] | PEN_INJECTOR | Freq: Every day | SUBCUTANEOUS | 11 refills | Status: DC

## 2018-06-09 ENCOUNTER — Encounter: Payer: Self-pay | Admitting: Family Medicine

## 2018-06-09 NOTE — Assessment & Plan Note (Signed)
Hyperlipidemia:Low fat diet discussed and encouraged.   Lipid Panel  Lab Results  Component Value Date   CHOL 248 (H) 09/23/2017   HDL 68 09/23/2017   LDLCALC 153 (H) 09/23/2017   TRIG 138 09/23/2017   CHOLHDL 3.6 09/23/2017  Updated lab needed at/ before next visit.

## 2018-06-09 NOTE — Assessment & Plan Note (Signed)
Uncontreolled, medication to be increased if this persists  DASH diet and commitment to daily physical activity for a minimum of 30 minutes discussed and encouraged, as a part of hypertension management. The importance of attaining a healthy weight is also discussed.  BP/Weight 05/21/2018 05/12/2018 01/08/2018 11/14/2017 10/12/2016 09/30/2016 07/01/2016  Systolic BP 142 141 150 180 158 136 138  Diastolic BP 70 75 84 90 82 88 76  Wt. (Lbs) 253.12 262 - 266 265 264.04 260  BMI 42.12 43.6 - 44.26 44.1 43.94 43.27

## 2018-06-09 NOTE — Assessment & Plan Note (Signed)
Glenda Thompson is reminded of the importance of commitment to daily physical activity for 30 minutes or more, as able and the need to limit carbohydrate intake to 30 to 60 grams per meal to help with blood sugar control.   The need to take medication as prescribed, test blood sugar as directed, and to call between visits if there is a concern that blood sugar is uncontrolled is also discussed.   Glenda Thompson is reminded of the importance of daily foot exam, annual eye examination, and good blood sugar, blood pressure and cholesterol control. Deteriorated, pt to start insulin and return in 6 weeks with log, needs endo but is refusing at this time, she is referred for education  Diabetic Labs Latest Ref Rng & Units 05/12/2018 11/15/2017 09/23/2017 09/30/2016 03/08/2016  HbA1c 4.8 - 5.6 % 11.7(H) - 10.4(H) 9.0(H) 7.6(H)  Microalbumin Not Estab. ug/mL - <3.0(H) - - -  Micro/Creat Ratio 0.0 - 30.0 mg/g creat - <9.3 - - -  Chol <200 mg/dL - - 161(W248(H) - -  HDL >96>50 mg/dL - - 68 - -  Calc LDL mg/dL (calc) - - 045(W153(H) - -  Triglycerides <150 mg/dL - - 098138 - -  Creatinine 0.44 - 1.00 mg/dL 1.190.95 - 1.470.78 8.290.89 5.621.06   BP/Weight 05/21/2018 05/12/2018 01/08/2018 11/14/2017 10/12/2016 09/30/2016 07/01/2016  Systolic BP 142 141 150 180 158 136 138  Diastolic BP 70 75 84 90 82 88 76  Wt. (Lbs) 253.12 262 - 266 265 264.04 260  BMI 42.12 43.6 - 44.26 44.1 43.94 43.27   Foot/eye exam completion dates 11/14/2017 10/12/2016  Foot Form Completion Done Done

## 2018-06-09 NOTE — Assessment & Plan Note (Signed)
Improved. Patient re-educated about  the importance of commitment to a  minimum of 150 minutes of exercise per week.  The importance of healthy food choices with portion control discussed. Encouraged to start a food diary, count calories and to consider  joining a support group. Sample diet sheets offered. Goals set by the patient for the next several months.   Weight /BMI 05/21/2018 05/12/2018 11/14/2017  WEIGHT 253 lb 1.9 oz 262 lb 266 lb  HEIGHT 5\' 5"  5\' 5"  5\' 5"   BMI 42.12 kg/m2 43.6 kg/m2 44.26 kg/m2

## 2018-06-09 NOTE — Progress Notes (Signed)
Glenda Thompson     MRN: [id number]      DOB: 1971-04-16   HPI [name] is here for follow up and re-evaluation of chronic medical conditions, medication management and review of any available recent lab and radiology data.  Preventive health is updated, specifically  Cancer screening and Immunization.   Questions or concerns regarding consultations or procedures which the PT has had in the interim are  addressed. The PT denies any adverse reactions to current medications since the last visit.  ROS Denies recent fever or chills. Denies sinus pressure, nasal congestion, ear pain or sore throat. Denies chest congestion, productive cough or wheezing. Denies chest pains, palpitations and leg swelling Denies abdominal pain, nausea, vomiting,diarrhea or constipation.   Denies dysuria, frequency, hesitancy or incontinence. Denies joint pain, swelling and limitation in mobility. Denies headaches, seizures, numbness, or tingling. Denies depression, anxiety or insomnia. Denies skin break down or rash.   PE  BP (!) 142/70   Pulse 93   Ht 5\' 5"  (1.651 m)   Wt 253 lb 1.9 oz (114.8 kg)   SpO2 98%   BMI 42.12 kg/m   Patient alert and oriented and in no cardiopulmonary distress.  HEENT: No facial asymmetry, EOMI,   oropharynx pink and moist.  Neck supple no JVD, no mass.  Chest: Clear to auscultation bilaterally.  CVS: S1, S2 no murmurs, no S3.Regular rate.  ABD: Soft non tender.   Ext: No edema  MS: Adequate ROM spine, shoulders, hips and knees.  Skin: Intact, no ulcerations or rash noted.  Psych: Good eye contact, normal affect. Memory intact not anxious or depressed appearing.  CNS: CN 2-12 intact, power,  normal throughout.no focal deficits noted.   Assessment & Plan  Hypertension goal BP (blood pressure) < 130/80 Uncontreolled, medication to be increased if this persists  DASH diet and commitment to daily physical activity for a minimum of 30 minutes discussed and  encouraged, as a part of hypertension management. The importance of attaining a healthy weight is also discussed.  BP/Weight 05/21/2018 05/12/2018 01/08/2018 11/14/2017 10/12/2016 09/30/2016 07/01/2016  Systolic BP 142 141 150 180 158 136 138  Diastolic BP 70 75 84 90 82 88 76  Wt. (Lbs) 253.12 262 - 266 265 264.04 260  BMI 42.12 43.6 - 44.26 44.1 43.94 43.27       Morbid obesity Improved. Patient re-educated about  the importance of commitment to a  minimum of 150 minutes of exercise per week.  The importance of healthy food choices with portion control discussed. Encouraged to start a food diary, count calories and to consider  joining a support group. Sample diet sheets offered. Goals set by the patient for the next several months.   Weight /BMI 05/21/2018 05/12/2018 11/14/2017  WEIGHT 253 lb 1.9 oz 262 lb 266 lb  HEIGHT 5\' 5"  5\' 5"  5\' 5"   BMI 42.12 kg/m2 43.6 kg/m2 44.26 kg/m2      Diabetes mellitus type 2 in obese [name] is reminded of the importance of commitment to daily physical activity for 30 minutes or more, as able and the need to limit carbohydrate intake to 30 to 60 grams per meal to help with blood sugar control.   The need to take medication as prescribed, test blood sugar as directed, and to call between visits if there is a concern that blood sugar is uncontrolled is also discussed.   [name] is reminded of the importance of daily foot exam, annual eye examination, and  good blood sugar, blood pressure and cholesterol control. Deteriorated, pt to start insulin and return in 6 weeks with log, needs endo but is refusing at this time, she is referred for education  Diabetic Labs Latest Ref Rng & Units 05/12/2018 11/15/2017 09/23/2017 09/30/2016 03/08/2016  HbA1c 4.8 - 5.6 % 11.7(H) - 10.4(H) 9.0(H) 7.6(H)  Microalbumin Not Estab. ug/mL - <3.0(H) - - -  Micro/Creat Ratio 0.0 - 30.0 mg/g creat - <9.3 - - -  Chol <200 mg/dL - - 161(W248(H) - -  HDL >96>50 mg/dL - - 68 - -    Calc LDL mg/dL (calc) - - 045(W153(H) - -  Triglycerides <150 mg/dL - - 098138 - -  Creatinine 0.44 - 1.00 mg/dL 1.190.95 - 1.470.78 8.290.89 5.621.06   BP/Weight 05/21/2018 05/12/2018 01/08/2018 11/14/2017 10/12/2016 09/30/2016 07/01/2016  Systolic BP 142 141 150 180 158 136 138  Diastolic BP 70 75 84 90 82 88 76  Wt. (Lbs) 253.12 262 - 266 265 264.04 260  BMI 42.12 43.6 - 44.26 44.1 43.94 43.27   Foot/eye exam completion dates 11/14/2017 10/12/2016  Foot Form Completion Done Done        Hyperlipidemia LDL goal <100 Hyperlipidemia:Low fat diet discussed and encouraged.   Lipid Panel  Lab Results  Component Value Date   CHOL 248 (H) 09/23/2017   HDL 68 09/23/2017   LDLCALC 153 (H) 09/23/2017   TRIG 138 09/23/2017   CHOLHDL 3.6 09/23/2017  Updated lab needed at/ before next visit.

## 2018-06-11 ENCOUNTER — Telehealth: Payer: Self-pay

## 2018-06-11 DIAGNOSIS — E669 Obesity, unspecified: Principal | ICD-10-CM

## 2018-06-11 DIAGNOSIS — E1169 Type 2 diabetes mellitus with other specified complication: Secondary | ICD-10-CM

## 2018-06-11 NOTE — Telephone Encounter (Signed)
Order for ambulatory referral for diabetic education entered per Dr.Simpson

## 2018-06-25 ENCOUNTER — Encounter: Payer: Self-pay | Admitting: Family Medicine

## 2018-06-26 ENCOUNTER — Other Ambulatory Visit: Payer: Self-pay

## 2018-06-26 ENCOUNTER — Other Ambulatory Visit: Payer: Self-pay | Admitting: Family Medicine

## 2018-06-26 MED ORDER — FREESTYLE LANCETS MISC: 5 refills | Status: DC

## 2018-06-26 MED ORDER — GLUCOSE BLOOD VI STRP: ORAL_STRIP | 5 refills | Status: DC

## 2018-06-27 ENCOUNTER — Encounter: Payer: Self-pay | Admitting: Family Medicine

## 2018-06-27 DIAGNOSIS — E1169 Type 2 diabetes mellitus with other specified complication: Secondary | ICD-10-CM

## 2018-06-27 DIAGNOSIS — E669 Obesity, unspecified: Principal | ICD-10-CM

## 2018-06-28 MED ORDER — INSULIN PEN NEEDLE 31G X 8 MM MISC: 2 refills | Status: AC

## 2018-07-04 ENCOUNTER — Ambulatory Visit: Payer: BLUE CROSS/BLUE SHIELD | Admitting: Family Medicine

## 2018-07-09 ENCOUNTER — Ambulatory Visit: Payer: Self-pay | Admitting: "Endocrinology

## 2018-07-24 ENCOUNTER — Other Ambulatory Visit: Payer: Self-pay | Admitting: Family Medicine

## 2018-08-01 LAB — HEMOGLOBIN A1C: Hgb A1c MFr Bld: 9.7 — AB (ref 4.0–6.0)

## 2018-08-01 LAB — LIPID PANEL
Cholesterol: 235 — AB (ref 0–200)
HDL: 63 (ref 35–70)
LDL CALC: 138
Triglycerides: 196 — AB (ref 40–160)

## 2018-08-01 LAB — BASIC METABOLIC PANEL
BUN: 19 (ref 4–21)
CREATININE: 0.9 (ref 0.5–1.1)

## 2018-08-07 ENCOUNTER — Ambulatory Visit: Payer: BLUE CROSS/BLUE SHIELD | Admitting: Family Medicine

## 2018-08-09 ENCOUNTER — Encounter: Payer: Self-pay | Admitting: "Endocrinology

## 2018-08-09 ENCOUNTER — Ambulatory Visit: Payer: BLUE CROSS/BLUE SHIELD | Admitting: Nutrition

## 2018-08-09 ENCOUNTER — Ambulatory Visit: Payer: BLUE CROSS/BLUE SHIELD | Admitting: "Endocrinology

## 2018-08-09 VITALS — BP 145/84 | HR 84 | Ht 65.0 in | Wt 260.0 lb

## 2018-08-09 DIAGNOSIS — E1165 Type 2 diabetes mellitus with hyperglycemia: Secondary | ICD-10-CM | POA: Diagnosis not present

## 2018-08-09 DIAGNOSIS — E782 Mixed hyperlipidemia: Secondary | ICD-10-CM

## 2018-08-09 DIAGNOSIS — I10 Essential (primary) hypertension: Secondary | ICD-10-CM | POA: Diagnosis not present

## 2018-08-09 MED ORDER — ROSUVASTATIN CALCIUM 5 MG PO TABS: 5.0000 mg | ORAL_TABLET | Freq: Every day | ORAL | 3 refills | Status: DC

## 2018-08-09 MED ORDER — INSULIN DETEMIR 100 UNIT/ML FLEXPEN: 40.0000 [IU] | PEN_INJECTOR | Freq: Every day | SUBCUTANEOUS | 2 refills | Status: DC

## 2018-08-09 MED ORDER — METFORMIN HCL ER 500 MG PO TB24: 500.0000 mg | ORAL_TABLET | Freq: Every day | ORAL | 3 refills | Status: DC

## 2018-08-09 NOTE — Progress Notes (Signed)
Endocrinology Consult Note       08/09/2018, 5:27 PM   Subjective:    Patient ID: Glenda Thompson, female    DOB: Jun 18, 1971.  Glenda Thompson is being seen in consultation for management of currently uncontrolled symptomatic type 2 diabetes, requested by  Kerri Perches, MD.   Past Medical History:  Diagnosis Date  . Diabetes mellitus   . Hypertension    Past Surgical History:  Procedure Laterality Date  . CESAREAN SECTION     Social History   Socioeconomic History  . Marital status: Married    Spouse name: Not on file  . Number of children: Not on file  . Years of education: Not on file  . Highest education level: Not on file  Occupational History  . Not on file  Social Needs  . Financial resource strain: Not on file  . Food insecurity:    Worry: Not on file    Inability: Not on file  . Transportation needs:    Medical: Not on file    Non-medical: Not on file  Tobacco Use  . Smoking status: Never Smoker  . Smokeless tobacco: Never Used  Substance and Sexual Activity  . Alcohol use: Never    Frequency: Never  . Drug use: Never  . Sexual activity: Not on file  Lifestyle  . Physical activity:    Days per week: Not on file    Minutes per session: Not on file  . Stress: Not on file  Relationships  . Social connections:    Talks on phone: Not on file    Gets together: Not on file    Attends religious service: Not on file    Active member of club or organization: Not on file    Attends meetings of clubs or organizations: Not on file    Relationship status: Not on file  Other Topics Concern  . Not on file  Social History Narrative  . Not on file   Outpatient Encounter Medications as of 08/09/2018  Medication Sig  . amLODipine (NORVASC) 10 MG tablet TAKE 1 TABLET(10 MG) BY MOUTH DAILY  . fluticasone (FLONASE) 50 MCG/ACT nasal spray Place 2 sprays into both nostrils daily.   Marland Kitchen glucose blood (FREESTYLE LITE) test strip USE TO TEST THREE TIMES DAILY  . Insulin Detemir (LEVEMIR FLEXTOUCH) 100 UNIT/ML Pen Inject 40 Units into the skin at bedtime.  . Insulin Pen Needle (B-D ULTRAFINE III SHORT PEN) 31G X 8 MM MISC Use as directed to inject insulin daily.  . Lancets (FREESTYLE) lancets TEST THREE TIMES DAILY  . lisinopril-hydrochlorothiazide (PRINZIDE,ZESTORETIC) 20-12.5 MG tablet TAKE 2 TABLETS BY MOUTH DAILY  . metFORMIN (GLUCOPHAGE XR) 500 MG 24 hr tablet Take 1 tablet (500 mg total) by mouth daily with breakfast.  . rosuvastatin (CRESTOR) 5 MG tablet Take 1 tablet (5 mg total) by mouth daily.  Marland Kitchen spironolactone (ALDACTONE) 25 MG tablet Take 1 tablet (25 mg total) by mouth daily.  . [DISCONTINUED] glipiZIDE (GLUCOTROL) 10 MG tablet Take 1 tablet (10 mg total) by mouth 2 (two) times daily before a meal.  . [DISCONTINUED] Insulin Detemir (LEVEMIR)  100 UNIT/ML Pen Inject 25 Units into the skin daily at 10 pm.  . [DISCONTINUED] Insulin Glargine (LANTUS SOLOSTAR) 100 UNIT/ML Solostar Pen Inject 25 units lantus under skin at bedtime  . [DISCONTINUED] metFORMIN (GLUCOPHAGE) 500 MG tablet Take 500 mg by mouth daily with breakfast.  . [DISCONTINUED] metFORMIN (GLUCOPHAGE) 500 MG tablet Take 1 tablet (500 mg total) by mouth daily with breakfast.  . [DISCONTINUED] rosuvastatin (CRESTOR) 20 MG tablet Take 1 tablet (20 mg total) by mouth daily.   No facility-administered encounter medications on file as of 08/09/2018.     ALLERGIES: Allergies  Allergen Reactions  . Metformin And Related Diarrhea    VACCINATION STATUS: Immunization History  Administered Date(s) Administered  . H1N1 09/24/2008  . Influenza Whole 09/07/2007, 07/29/2010  . Influenza,inj,Quad PF,6+ Mos 09/25/2013, 06/16/2014, 07/01/2016, 11/15/2017  . Pneumococcal Polysaccharide-23 09/07/2007, 10/12/2016  . Td 10/24/2001  . Tdap 10/12/2011    Diabetes  She presents for her initial diabetic visit. She has  type 2 diabetes mellitus. Onset time: She was diagnosed at approximate age of 29 years. Her disease course has been fluctuating (She had A1c of 11.7% in July 2019.  Most recent labs on August 01, 2018 was 9.7%.). There are no hypoglycemic associated symptoms. Pertinent negatives for hypoglycemia include no confusion, headaches, pallor or seizures. Associated symptoms include fatigue, polydipsia and polyuria. Pertinent negatives for diabetes include no chest pain and no polyphagia. There are no hypoglycemic complications. Symptoms are worsening. There are no diabetic complications. Risk factors for coronary artery disease include diabetes mellitus, dyslipidemia, hypertension, obesity and sedentary lifestyle. Current diabetic treatment includes insulin injections and oral agent (dual therapy). Compliance with diabetes treatment: She was previously seen in this clinic for diabetes management.  Did not show up for more than 3 years. Her weight is fluctuating minimally. She is following a generally unhealthy diet. When asked about meal planning, she reported none. She has had a previous visit with a dietitian. She rarely participates in exercise. (She did not bring any meter nor logs to review today.  Her A1c was 9.7% from August 01, 2018.) An ACE inhibitor/angiotensin II receptor blocker is being taken.  Hyperlipidemia  This is a chronic problem. The current episode started more than 1 year ago. The problem is uncontrolled. Recent lipid tests were reviewed and are high. Pertinent negatives include no chest pain, myalgias or shortness of breath. She is currently on no antihyperlipidemic treatment. Risk factors for coronary artery disease include diabetes mellitus, dyslipidemia, hypertension, a sedentary lifestyle and obesity.  Hypertension  This is a chronic problem. The problem is uncontrolled. Pertinent negatives include no chest pain, headaches, palpitations or shortness of breath. Risk factors for coronary artery  disease include dyslipidemia, diabetes mellitus, obesity, sedentary lifestyle and family history. Past treatments include ACE inhibitors and calcium channel blockers.    Review of Systems  Constitutional: Positive for fatigue. Negative for chills, fever and unexpected weight change.  HENT: Negative for trouble swallowing and voice change.   Eyes: Negative for visual disturbance.  Respiratory: Negative for cough, shortness of breath and wheezing.   Cardiovascular: Negative for chest pain, palpitations and leg swelling.  Gastrointestinal: Negative for diarrhea, nausea and vomiting.  Endocrine: Positive for polydipsia and polyuria. Negative for cold intolerance, heat intolerance and polyphagia.  Musculoskeletal: Negative for arthralgias and myalgias.  Skin: Negative for color change, pallor, rash and wound.  Neurological: Negative for seizures and headaches.  Psychiatric/Behavioral: Negative for confusion and suicidal ideas.    Objective:  BP (!) 145/84   Pulse 84   Ht 5\' 5"  (1.651 m)   Wt 260 lb (117.9 kg)   BMI 43.27 kg/m   Wt Readings from Last 3 Encounters:  08/09/18 260 lb (117.9 kg)  05/21/18 253 lb 1.9 oz (114.8 kg)  05/12/18 262 lb (118.8 kg)     Physical Exam  Constitutional: She is oriented to person, place, and time. She appears well-developed.  HENT:  Head: Normocephalic and atraumatic.  Eyes: EOM are normal.  Neck: Normal range of motion. Neck supple. No tracheal deviation present. No thyromegaly present.  Cardiovascular: Normal rate and regular rhythm.  Pulmonary/Chest: Effort normal and breath sounds normal.  Abdominal: Soft. Bowel sounds are normal. There is no tenderness. There is no guarding.  Obese.  Musculoskeletal: Normal range of motion. She exhibits no edema.  Neurological: She is alert and oriented to person, place, and time. She has normal reflexes. No cranial nerve deficit. Coordination normal.  Skin: Skin is warm and dry. No rash noted. No  erythema. No pallor.  Psychiatric: She has a normal mood and affect. Judgment normal.   CMP ( most recent) CMP     Component Value Date/Time   NA 135 05/12/2018 1137   K 4.0 05/12/2018 1137   CL 100 05/12/2018 1137   CO2 28 05/12/2018 1137   GLUCOSE 373 (H) 05/12/2018 1137   BUN 19 08/01/2018   CREATININE 0.9 08/01/2018   CREATININE 0.95 05/12/2018 1137   CREATININE 0.78 09/23/2017 0823   CALCIUM 9.2 05/12/2018 1137   PROT 6.9 09/23/2017 0823   ALBUMIN 4.0 03/08/2016 0910   AST 11 09/23/2017 0823   ALT 15 09/23/2017 0823   ALKPHOS 44 03/08/2016 0910   BILITOT 0.5 09/23/2017 0823   GFRNONAA >60 05/12/2018 1137   GFRNONAA 91 09/23/2017 0823   GFRAA >60 05/12/2018 1137   GFRAA 105 09/23/2017 0823   Diabetic Labs (most recent): Lab Results  Component Value Date   HGBA1C 9.7 (A) 08/01/2018   HGBA1C 11.7 (H) 05/12/2018   HGBA1C 10.4 (H) 09/23/2017     Lipid Panel ( most recent) Lipid Panel     Component Value Date/Time   CHOL 235 (A) 08/01/2018   TRIG 196 (A) 08/01/2018   HDL 63 08/01/2018   CHOLHDL 3.6 09/23/2017 0823   VLDL 11 10/02/2014 0920   LDLCALC 138 08/01/2018   LDLCALC 153 (H) 09/23/2017 0823      Lab Results  Component Value Date   TSH 0.737 10/02/2014   TSH 1.229 12/11/2012   TSH 1.079 10/12/2011   TSH 0.958 06/24/2010      Assessment & Plan:   1. Uncontrolled type 2 diabetes mellitus with hyperglycemia (HCC)  - Glenda Thompson has currently uncontrolled symptomatic type 2 DM since 47 years of age,  with most recent A1c of 9.7 %. Recent labs reviewed.  -her diabetes is complicated by nonadherence to recommendations and follow-ups,  obesity/sedentary life and she remains at extremely high risk for more acute and chronic complications which include CAD, CVA, CKD, retinopathy, and neuropathy. These are all discussed in detail with her.  - I have counseled her on diet management and weight loss, by adopting a carbohydrate restricted/protein rich  diet.  - Suggestion is made for her to avoid simple carbohydrates  from her diet including Cakes, Sweet Desserts, Ice Cream, Soda (diet and regular), Sweet Tea, Candies, Chips, Cookies, Store Bought Juices, Alcohol in Excess of  1-2 drinks a day, Artificial Sweeteners,  Coffee Creamer,  and "Sugar-free" Products. This will help patient to have more stable blood glucose profile and potentially avoid unintended weight gain.  - I encouraged her to switch to  unprocessed or minimally processed complex starch and increased protein intake (animal or plant source), fruits, and vegetables.  - she is advised to stick to a routine mealtimes to eat 3 meals  a day and avoid unnecessary snacks ( to snack only to correct hypoglycemia).   - she will be scheduled with Norm Salt, RDN, CDE for individualized diabetes education.  - I have approached her with the following individualized plan to manage diabetes and patient agrees:   -She may need intensive treatment with basal/bolus insulin in order for her to achieve and maintain control of diabetes to target.  -For that to happen, her commitment for proper monitoring of blood glucose needs to be assured.  -In preparation, I approached her to start monitoring blood glucose 4 times a day-before meals and at bedtime and return in 10 days with her meter and logs for reevaluation. -In the meantime, I advised her to increase Levemir to 40 units nightly.   - she is warned not to take insulin without proper monitoring per orders. -Adjustment parameters are given for hypo and hyperglycemia in writing. - she is encouraged to call clinic for blood glucose levels less than 70 or above 300 mg /dl.  - I will discontinue glipizide, risk outweighs benefit for this patient. -She reports intolerance to regular metformin.  I discussed and initiated metformin 500 mg ER daily after breakfast, to advance if tolerated.  - she will be considered for incretin therapy as appropriate  next visit. - Patient specific target  A1c;  LDL, HDL, Triglycerides, and  Waist Circumference were discussed in detail.  2) BP/HTN:  her blood pressure is not controlled to target.   she is advised to continue her current medications including lisinopril-HCTZ 20/12.5 mg p.o. daily with breakfast , as well as amlodipine 10 mg p.o. Daily. 3) Lipids/HPL:   Review of her recent lipid panel showed LDL of 138 uncontrolled .  She reports arthralgias from Crestor 20 mg nightly.  I discussed and restarted Crestor at 5 mg p.o. nightly to advance as tolerated. Side effects and precautions discussed with her.  4)  Weight/Diet:  Body mass index is 43.27 kg/m.  - clearly complicating her diabetes care.  I discussed with her the fact that loss of 5 - 10% of her  current body weight will have the most impact on her diabetes management.  CDE Consult will be initiated . Exercise, and detailed carbohydrates information provided  -  detailed on discharge instructions.  5) Chronic Care/Health Maintenance:  -she  is on ACEI/ARB and Statin medications and  is encouraged to initiate and continue to follow up with Ophthalmology, Dentist,  Podiatrist at least yearly or according to recommendations, and advised to stay away from smoking. I have recommended yearly flu vaccine and pneumonia vaccine at least every 5 years; moderate intensity exercise for up to 150 minutes weekly; and  sleep for at least 7 hours a day.  - I advised patient to maintain close follow up with Kerri Perches, MD for primary care needs.  - Time spent with the patient: 45 minutes, of which >50% was spent in obtaining information about her symptoms, reviewing her previous labs, evaluations, and treatments, counseling her about her currently uncontrolled type 2 diabetes, hypertension, hyperlipidemia, obesity, and developing plans for long term treatment based on the latest  recommendations.  [name] participated in the discussions, expressed  understanding, and voiced agreement with the above plans.  All questions were answered to her satisfaction. she is encouraged to contact clinic should she have any questions or concerns prior to her return visit.  Follow up plan: - Return in about 10 days (around 08/19/2018) for Follow up with Meter and Logs Only - no Labs.  Marquis Lunch, MD [facility] [address] [place], [place] [address] Phone: [phone]  Fax: [phone]    08/09/2018, 5:27 PM  This note was partially dictated with voice recognition software. Similar sounding words can be transcribed inadequately or may not  be corrected upon review.

## 2018-08-09 NOTE — Patient Instructions (Signed)

## 2018-08-22 ENCOUNTER — Ambulatory Visit: Payer: Self-pay | Admitting: Family Medicine

## 2018-08-30 ENCOUNTER — Ambulatory Visit: Payer: BLUE CROSS/BLUE SHIELD | Admitting: "Endocrinology

## 2018-08-30 ENCOUNTER — Encounter: Payer: Self-pay | Admitting: "Endocrinology

## 2018-08-30 VITALS — BP 148/82 | HR 78 | Ht 65.0 in | Wt 260.0 lb

## 2018-08-30 DIAGNOSIS — Z91199 Patient's noncompliance with other medical treatment and regimen due to unspecified reason: Secondary | ICD-10-CM | POA: Insufficient documentation

## 2018-08-30 DIAGNOSIS — I10 Essential (primary) hypertension: Secondary | ICD-10-CM | POA: Diagnosis not present

## 2018-08-30 DIAGNOSIS — E1165 Type 2 diabetes mellitus with hyperglycemia: Secondary | ICD-10-CM

## 2018-08-30 DIAGNOSIS — Z9119 Patient's noncompliance with other medical treatment and regimen: Secondary | ICD-10-CM | POA: Diagnosis not present

## 2018-08-30 DIAGNOSIS — E782 Mixed hyperlipidemia: Secondary | ICD-10-CM

## 2018-08-30 MED ORDER — METFORMIN HCL ER (MOD) 1000 MG PO TB24: 1000.0000 mg | ORAL_TABLET | Freq: Every day | ORAL | 6 refills | Status: DC

## 2018-08-30 MED ORDER — GLUCOSE BLOOD VI STRP: ORAL_STRIP | 5 refills | Status: DC

## 2018-08-30 MED ORDER — FREESTYLE LANCETS MISC: 5 refills | Status: DC

## 2018-08-30 NOTE — Patient Instructions (Signed)

## 2018-08-30 NOTE — Progress Notes (Signed)
Endocrinology Consult Note       08/30/2018, 4:38 PM   Subjective:    Patient ID: Glenda Thompson, female    DOB: 1971/10/21.  Glenda Thompson is being seen in consultation for management of currently uncontrolled symptomatic type 2 diabetes, requested by  Kerri Perches, MD.   Past Medical History:  Diagnosis Date  . Diabetes mellitus   . Hypertension    Past Surgical History:  Procedure Laterality Date  . CESAREAN SECTION     Social History   Socioeconomic History  . Marital status: Married    Spouse name: Not on file  . Number of children: Not on file  . Years of education: Not on file  . Highest education level: Not on file  Occupational History  . Not on file  Social Needs  . Financial resource strain: Not on file  . Food insecurity:    Worry: Not on file    Inability: Not on file  . Transportation needs:    Medical: Not on file    Non-medical: Not on file  Tobacco Use  . Smoking status: Never Smoker  . Smokeless tobacco: Never Used  Substance and Sexual Activity  . Alcohol use: Never    Frequency: Never  . Drug use: Never  . Sexual activity: Not on file  Lifestyle  . Physical activity:    Days per week: Not on file    Minutes per session: Not on file  . Stress: Not on file  Relationships  . Social connections:    Talks on phone: Not on file    Gets together: Not on file    Attends religious service: Not on file    Active member of club or organization: Not on file    Attends meetings of clubs or organizations: Not on file    Relationship status: Not on file  Other Topics Concern  . Not on file  Social History Narrative  . Not on file   Outpatient Encounter Medications as of 08/30/2018  Medication Sig  . amLODipine (NORVASC) 10 MG tablet TAKE 1 TABLET(10 MG) BY MOUTH DAILY  . fluticasone (FLONASE) 50 MCG/ACT nasal spray Place 2 sprays into both nostrils daily.   Marland Kitchen glucose blood (FREESTYLE LITE) test strip USE TO TEST THREE TIMES DAILY  . Insulin Detemir (LEVEMIR FLEXTOUCH) 100 UNIT/ML Pen Inject 40 Units into the skin at bedtime.  . Insulin Pen Needle (B-D ULTRAFINE III SHORT PEN) 31G X 8 MM MISC Use as directed to inject insulin daily.  . Lancets (FREESTYLE) lancets TEST THREE TIMES DAILY  . lisinopril-hydrochlorothiazide (PRINZIDE,ZESTORETIC) 20-12.5 MG tablet TAKE 2 TABLETS BY MOUTH DAILY  . metFORMIN (GLUMETZA) 1000 MG (MOD) 24 hr tablet Take 1 tablet (1,000 mg total) by mouth daily with breakfast.  . rosuvastatin (CRESTOR) 5 MG tablet Take 1 tablet (5 mg total) by mouth daily.  Marland Kitchen spironolactone (ALDACTONE) 25 MG tablet Take 1 tablet (25 mg total) by mouth daily. (Patient not taking: Reported on 08/30/2018)  . [DISCONTINUED] glucose blood (FREESTYLE LITE) test strip USE TO TEST THREE TIMES DAILY  . [DISCONTINUED] Lancets (FREESTYLE) lancets TEST THREE  TIMES DAILY  . [DISCONTINUED] metFORMIN (GLUCOPHAGE XR) 500 MG 24 hr tablet Take 1 tablet (500 mg total) by mouth daily with breakfast.   No facility-administered encounter medications on file as of 08/30/2018.     ALLERGIES: Allergies  Allergen Reactions  . Metformin And Related Diarrhea    VACCINATION STATUS: Immunization History  Administered Date(s) Administered  . H1N1 09/24/2008  . Influenza Whole 09/07/2007, 07/29/2010  . Influenza,inj,Quad PF,6+ Mos 09/25/2013, 06/16/2014, 07/01/2016, 11/15/2017  . Pneumococcal Polysaccharide-23 09/07/2007, 10/12/2016  . Td 10/24/2001  . Tdap 10/12/2011    Diabetes  She presents for her follow-up diabetic visit. She has type 2 diabetes mellitus. Onset time: She was diagnosed at approximate age of 29 years. Her disease course has been worsening (She had A1c of 11.7% in July 2019.  Most recent labs on August 01, 2018 was 9.7%.). There are no hypoglycemic associated symptoms. Pertinent negatives for hypoglycemia include no confusion, headaches, pallor  or seizures. Associated symptoms include fatigue, polydipsia and polyuria. Pertinent negatives for diabetes include no chest pain and no polyphagia. There are no hypoglycemic complications. Symptoms are worsening. There are no diabetic complications. Risk factors for coronary artery disease include diabetes mellitus, dyslipidemia, hypertension, obesity and sedentary lifestyle. Current diabetic treatment includes insulin injections and oral agent (dual therapy). Compliance with diabetes treatment: She was previously seen in this clinic for diabetes management.  Did not show up for more than 3 years. Her weight is fluctuating minimally. She is following a generally unhealthy diet. When asked about meal planning, she reported none. She has had a previous visit with a dietitian. She rarely participates in exercise. (Unfortunately, patient presents with suspicious log filled with suspicious looking blood glucose readings.  Her most recent A1c was 9.7%, however the readings on her logs are near target averaging 100 . ) An ACE inhibitor/angiotensin II receptor blocker is being taken.  Hyperlipidemia  This is a chronic problem. The current episode started more than 1 year ago. The problem is uncontrolled. Recent lipid tests were reviewed and are high. Pertinent negatives include no chest pain, myalgias or shortness of breath. She is currently on no antihyperlipidemic treatment. Risk factors for coronary artery disease include diabetes mellitus, dyslipidemia, hypertension, a sedentary lifestyle and obesity.  Hypertension  This is a chronic problem. The problem is uncontrolled. Pertinent negatives include no chest pain, headaches, palpitations or shortness of breath. Risk factors for coronary artery disease include dyslipidemia, diabetes mellitus, obesity, sedentary lifestyle and family history. Past treatments include ACE inhibitors and calcium channel blockers.    Review of Systems  Constitutional: Positive for  fatigue. Negative for chills, fever and unexpected weight change.  HENT: Negative for trouble swallowing and voice change.   Eyes: Negative for visual disturbance.  Respiratory: Negative for cough, shortness of breath and wheezing.   Cardiovascular: Negative for chest pain, palpitations and leg swelling.  Gastrointestinal: Negative for diarrhea, nausea and vomiting.  Endocrine: Positive for polydipsia and polyuria. Negative for cold intolerance, heat intolerance and polyphagia.  Musculoskeletal: Negative for arthralgias and myalgias.  Skin: Negative for color change, pallor, rash and wound.  Neurological: Negative for seizures and headaches.  Psychiatric/Behavioral: Negative for confusion and suicidal ideas.    Objective:    BP (!) 148/82   Pulse 78   Ht 5\' 5"  (1.651 m)   Wt 260 lb (117.9 kg)   BMI 43.27 kg/m   Wt Readings from Last 3 Encounters:  08/30/18 260 lb (117.9 kg)  08/09/18 260 lb (117.9 kg)  05/21/18 253 lb 1.9 oz (114.8 kg)     Physical Exam  Constitutional: She is oriented to person, place, and time. She appears well-developed.  HENT:  Head: Normocephalic and atraumatic.  Eyes: EOM are normal.  Neck: Normal range of motion. Neck supple. No tracheal deviation present. No thyromegaly present.  Cardiovascular: Normal rate and regular rhythm.  Pulmonary/Chest: Effort normal.  Abdominal: There is no tenderness. There is no guarding.  Obese.  Musculoskeletal: Normal range of motion. She exhibits no edema.  Neurological: She is alert and oriented to person, place, and time. She has normal reflexes. No cranial nerve deficit. Coordination normal.  Skin: Skin is warm and dry. No rash noted. No erythema. No pallor.  Psychiatric: She has a normal mood and affect. Judgment normal.    CMP     Component Value Date/Time   NA 135 05/12/2018 1137   K 4.0 05/12/2018 1137   CL 100 05/12/2018 1137   CO2 28 05/12/2018 1137   GLUCOSE 373 (H) 05/12/2018 1137   BUN 19  08/01/2018   CREATININE 0.9 08/01/2018   CREATININE 0.95 05/12/2018 1137   CREATININE 0.78 09/23/2017 0823   CALCIUM 9.2 05/12/2018 1137   PROT 6.9 09/23/2017 0823   ALBUMIN 4.0 03/08/2016 0910   AST 11 09/23/2017 0823   ALT 15 09/23/2017 0823   ALKPHOS 44 03/08/2016 0910   BILITOT 0.5 09/23/2017 0823   GFRNONAA >60 05/12/2018 1137   GFRNONAA 91 09/23/2017 0823   GFRAA >60 05/12/2018 1137   GFRAA 105 09/23/2017 0823   Diabetic Labs (most recent): Lab Results  Component Value Date   HGBA1C 9.7 (A) 08/01/2018   HGBA1C 11.7 (H) 05/12/2018   HGBA1C 10.4 (H) 09/23/2017     Lipid Panel ( most recent) Lipid Panel     Component Value Date/Time   CHOL 235 (A) 08/01/2018   TRIG 196 (A) 08/01/2018   HDL 63 08/01/2018   CHOLHDL 3.6 09/23/2017 0823   VLDL 11 10/02/2014 0920   LDLCALC 138 08/01/2018   LDLCALC 153 (H) 09/23/2017 0823      Lab Results  Component Value Date   TSH 0.737 10/02/2014   TSH 1.229 12/11/2012   TSH 1.079 10/12/2011   TSH 0.958 06/24/2010      Assessment & Plan:   1. Uncontrolled type 2 diabetes mellitus with hyperglycemia (HCC)  - Glenda Thompson has currently uncontrolled symptomatic type 2 DM since 47 years of age,  with most recent A1c of 9.7 %. Recent labs reviewed. -Patient presented with suspicious looking blood glucose readings on her logs. -her diabetes is complicated by nonadherence to recommendations and follow-ups,  obesity/sedentary life and she remains at extremely high risk for more acute and chronic complications which include CAD, CVA, CKD, retinopathy, and neuropathy. These are all discussed in detail with her.  - I have counseled her on diet management and weight loss, by adopting a carbohydrate restricted/protein rich diet.  -She still admits to dietary indiscretions, including consumption of sweets and sweetened beverages.  -  Suggestion is made for her to avoid simple carbohydrates  from her diet including Cakes, Sweet Desserts  / Pastries, Ice Cream, Soda (diet and regular), Sweet Tea, Candies, Chips, Cookies, Store Bought Juices, Alcohol in Excess of  1-2 drinks a day, Artificial Sweeteners, and "Sugar-free" Products. This will help patient to have stable blood glucose profile and potentially avoid unintended weight gain.   - I encouraged her to switch to  unprocessed or minimally processed complex starch  and increased protein intake (animal or plant source), fruits, and vegetables.  - she is advised to stick to a routine mealtimes to eat 3 meals  a day and avoid unnecessary snacks ( to snack only to correct hypoglycemia).   - she will be scheduled with Norm Salt, RDN, CDE for individualized diabetes education.  - I have approached her with the following individualized plan to manage diabetes and patient agrees:   -She may need intensive treatment with basal/bolus insulin in order for her to achieve and maintain control of diabetes to target.  -For that to happen, her commitment for proper monitoring of blood glucose needs to be assured.  -Unfortunately, she is not committed for proper monitoring of blood glucose to initiate his insulin program.    -In the meantime, I advised her to continue Levemir 40 units nightly, continue with strict monitoring of blood glucose at least 2 times a day-daily before breakfast and at bedtime.   - she is warned not to take insulin without proper monitoring per orders.  - she is encouraged to call clinic for blood glucose levels less than 70 or above 300 mg /dl.  -She rated extended release metformin.  I discussed and increased her metformin to 1000 mg ER daily after breakfast.   - she will be considered for incretin therapy as appropriate next visit. - Patient specific target  A1c;  LDL, HDL, Triglycerides, and  Waist Circumference were discussed in detail.  2) BP/HTN:  her blood pressure is not controlled to target.    she is advised to continue her current medications  including lisinopril-HCTZ 20/12.5 mg p.o. daily with breakfast , as well as amlodipine 10 mg p.o. Daily.   3) Lipids/HPL:   Review of her recent lipid panel showed LDL of 138 uncontrolled .  She reports arthralgias from Crestor 20 mg nightly.  I discussed and restarted Crestor at 5 mg p.o. nightly .  She is tolerating the lower dose.  This will be advanced slowly as tolerated.    4)  Weight/Diet:  Body mass index is 43.27 kg/m.  - clearly complicating her diabetes care.  I discussed with her the fact that loss of 5 - 10% of her  current body weight will have the most impact on her diabetes management.  CDE Consult will be initiated . Exercise, and detailed carbohydrates information provided  -  detailed on discharge instructions.  5) Chronic Care/Health Maintenance:  -she  is on ACEI/ARB and Statin medications and  is encouraged to initiate and continue to follow up with Ophthalmology, Dentist,  Podiatrist at least yearly or according to recommendations, and advised to stay away from smoking. I have recommended yearly flu vaccine and pneumonia vaccine at least every 5 years; moderate intensity exercise for up to 150 minutes weekly; and  sleep for at least 7 hours a day.  - I advised patient to maintain close follow up with Kerri Perches, MD for primary care needs.  - Time spent with the patient: 25 min, of which >50% was spent in reviewing her blood glucose logs , discussing her hypo- and hyper-glycemic episodes, reviewing her current and  previous labs and insulin doses and developing a plan to avoid hypo- and hyper-glycemia. Please refer to Patient Instructions for Blood Glucose Monitoring and Insulin/Medications Dosing Guide"  in media tab for additional information. [name] participated in the discussions, expressed understanding, and voiced agreement with the above plans.  All questions were answered to her satisfaction. she  is encouraged to contact clinic should she have any  questions or concerns prior to her return visit.   Follow up plan: - Return in about 9 weeks (around 11/01/2018) for Meter, and Logs.  Marquis Lunch, MD [facility] [address] [place], [place] [address] Phone: [phone]  Fax: [phone]    08/30/2018, 4:38 PM  This note was partially dictated with voice recognition software. Similar sounding words can be transcribed inadequately or may not  be corrected upon review.

## 2018-09-03 ENCOUNTER — Other Ambulatory Visit: Payer: Self-pay

## 2018-09-03 MED ORDER — METFORMIN HCL ER 500 MG PO TB24: 1000.0000 mg | ORAL_TABLET | Freq: Every day | ORAL | 1 refills | Status: DC

## 2018-09-17 ENCOUNTER — Encounter: Payer: Self-pay | Admitting: Nutrition

## 2018-09-17 ENCOUNTER — Encounter: Payer: BLUE CROSS/BLUE SHIELD | Attending: Family Medicine | Admitting: Nutrition

## 2018-09-17 ENCOUNTER — Encounter

## 2018-09-17 VITALS — Ht 65.0 in | Wt 263.0 lb

## 2018-09-17 DIAGNOSIS — E118 Type 2 diabetes mellitus with unspecified complications: Principal | ICD-10-CM

## 2018-09-17 DIAGNOSIS — E782 Mixed hyperlipidemia: Secondary | ICD-10-CM

## 2018-09-17 DIAGNOSIS — E1165 Type 2 diabetes mellitus with hyperglycemia: Secondary | ICD-10-CM

## 2018-09-17 DIAGNOSIS — IMO0002 Reserved for concepts with insufficient information to code with codable children: Secondary | ICD-10-CM

## 2018-09-17 NOTE — Progress Notes (Signed)
Medical Nutrition Therapy:  Appt start time: 0800 end time:  0900.   Assessment:  Primary concerns today: Diabetes Type 2.. PMH: Obesity, HTN and Hyperlipidemia. LIves with her husband and daughter. She cooks most meals. Eats out often.  She and husband shop for groceries..  Works at [employer]. Eats 3 meals plus snacks. Had lost 50-60 lbs in the past year or two  but gained it back.  Works first shift. Eats on the run. Admits to eating a lot of fast food. Testing BS 2 times per day. FBS 100-130's. Bedtime 120-200's.  40 unites of Levmire and 1000 mg ER Of Metformin. Physical actviity: goes to gym occasionally-[employer]. . Current diet is high in fat, sodium and carbs and low in fresh fruits and lower carb vegetables. Willing to start planning meals ahead better and work on consistently working out. Vitals with BMI 09/17/2018 08/30/2018 08/09/2018 05/21/2018 05/21/2018  Height 5\' 5"  5\' 5"  5\' 5"   5\' 5"   Weight 263 lbs 260 lbs 260 lbs  253 lbs 2 oz  BMI 43.77 43.27 43.27  42.12  Systolic  148 145 142 164  Diastolic  82 84 70 68  Pulse  78 84  93  espirations          Lab Results  Component Value Date   HGBA1C 9.7 (A) 08/01/2018   CMP Latest Ref Rng & Units 08/01/2018 05/12/2018 09/23/2017  Glucose 70 - 99 mg/dL - 161(W373(H) 960(A255(H)  BUN 4 - 21 19 21(H) 14  Creatinine 0.5 - 1.1 0.9 0.95 0.78  Sodium 135 - 145 mmol/L - 135 136  Potassium 3.5 - 5.1 mmol/L - 4.0 4.5  Chloride 98 - 111 mmol/L - 100 103  CO2 22 - 32 mmol/L - 28 25  Calcium 8.9 - 10.3 mg/dL - 9.2 9.3  Total Protein 6.1 - 8.1 g/dL - - 6.9  Total Bilirubin 0.2 - 1.2 mg/dL - - 0.5  Alkaline Phos 33 - 115 U/L - - -  AST 10 - 35 U/L - - 11  ALT 6 - 29 U/L - - 15   Lipid Panel     Component Value Date/Time   CHOL 235 (A) 08/01/2018   TRIG 196 (A) 08/01/2018   HDL 63 08/01/2018   CHOLHDL 3.6 09/23/2017 0823   VLDL 11 10/02/2014 0920   LDLCALC 138 08/01/2018   LDLCALC 153 (H) 09/23/2017 0823    Preferred Learning Style:    No  preference indicated   Learning Readiness:   Ready  Change in progress   MEDICATIONS:  DIETARY INTAKE:  24-hr recall:  B ( AM): Boiled egg and grits, coffee or water or Chicken biscuit  Snk ( AM): nuts or cheese  L ( PM): 6 in sub or taco salad or meat and some vegetables, water Snk ( PM): apple or  Nuts and cheese D ( PM): Grilled chicken, shrimip and mashed potatoes, water Snk ( PM): occassional nuts or apple Beverages: water  Usual physical activity: ADL  Estimated energy needs: 1200  calories 135 g carbohydrates 90 g protein 33 g fat  Progress Towards Goal(s):  In progress.   Nutritional Diagnosis:  NB-1.1 Food and nutrition-related knowledge deficit As related to Diabetes Type 2, .  As evidenced by A1C 9.7% and BMI > 40.    Intervention: Nutrition and Diabetes education provided on My Plate, CHO counting, meal planning, portion sizes, timing of meals, avoiding snacks between meals unless having a low blood sugar, target ranges for A1C and  blood sugars, signs/symptoms and treatment of hyper/hypoglycemia, monitoring blood sugars, taking medications as prescribed, benefits of exercising 30 minutes per day and prevention of complications of DM.  Goals 1. Follow MY Plate 2. Work out 60  minutes  Three times per  week. 3. Lose 1 lb per wek 4. Increase low  Carb vegetables 5. Cut out fast foods  Meal prep more often. FBS Less 130 and Bedtimes less 150 mg/dl.   Teaching Method Utilized:  Visual Auditory Hands on  Handouts given during visit include:  The Plate Method   Meal Plan Card   Barriers to learning/adherence to lifestyle change: none  Demonstrated degree of understanding via:  Teach Back   Monitoring/Evaluation:  Dietary intake, exercise, , and body weight in 1 month(s).

## 2018-09-17 NOTE — Patient Instructions (Signed)
Goals 1. Follow MY Plate 2. Work out 60  minutes a week. 3. Lose 1 lb per wek 4. Increase low  Carb vegetables 5. Cut out fast foods  Meal prep more often. FBS Less 130 and Bedtimes less 150 mg/dl.

## 2018-10-09 ENCOUNTER — Ambulatory Visit: Payer: Self-pay | Admitting: Family Medicine

## 2018-10-30 ENCOUNTER — Other Ambulatory Visit: Payer: Self-pay | Admitting: Family Medicine

## 2018-10-31 ENCOUNTER — Other Ambulatory Visit: Payer: Self-pay | Admitting: "Endocrinology

## 2018-11-01 ENCOUNTER — Other Ambulatory Visit: Payer: Self-pay | Admitting: "Endocrinology

## 2018-11-08 ENCOUNTER — Ambulatory Visit: Payer: BLUE CROSS/BLUE SHIELD | Admitting: "Endocrinology

## 2018-11-08 ENCOUNTER — Ambulatory Visit: Payer: BLUE CROSS/BLUE SHIELD | Admitting: Nutrition

## 2018-11-26 ENCOUNTER — Encounter: Payer: Self-pay | Admitting: Family Medicine

## 2018-11-26 ENCOUNTER — Ambulatory Visit (INDEPENDENT_AMBULATORY_CARE_PROVIDER_SITE_OTHER): Payer: BLUE CROSS/BLUE SHIELD | Admitting: Family Medicine

## 2018-11-26 VITALS — BP 162/80 | HR 75 | Resp 12 | Ht 65.0 in | Wt 251.0 lb

## 2018-11-26 DIAGNOSIS — E1159 Type 2 diabetes mellitus with other circulatory complications: Secondary | ICD-10-CM

## 2018-11-26 DIAGNOSIS — E669 Obesity, unspecified: Secondary | ICD-10-CM

## 2018-11-26 DIAGNOSIS — E1169 Type 2 diabetes mellitus with other specified complication: Secondary | ICD-10-CM

## 2018-11-26 DIAGNOSIS — E782 Mixed hyperlipidemia: Secondary | ICD-10-CM

## 2018-11-26 DIAGNOSIS — I10 Essential (primary) hypertension: Secondary | ICD-10-CM | POA: Diagnosis not present

## 2018-11-26 MED ORDER — AMLODIPINE BESYLATE 10 MG PO TABS: 10.0000 mg | ORAL_TABLET | Freq: Every day | ORAL | 3 refills | Status: DC

## 2018-11-26 MED ORDER — SPIRONOLACTONE 50 MG PO TABS: 50.0000 mg | ORAL_TABLET | Freq: Every day | ORAL | 3 refills | Status: DC

## 2018-11-26 NOTE — Assessment & Plan Note (Signed)
Improved Patient re-educated about  the importance of commitment to a  minimum of 150 minutes of exercise per week.  The importance of healthy food choices with portion control discussed. Encouraged to start a food diary, count calories and to consider  joining a support group. Sample diet sheets offered. Goals set by the patient for the next several months.   Weight /BMI 11/26/2018 09/17/2018 08/30/2018  WEIGHT 251 lb 0.6 oz 263 lb 260 lb  HEIGHT 5\' 5"  5\' 5"  5\' 5"   BMI 41.78 kg/m2 43.77 kg/m2 43.27 kg/m2

## 2018-11-26 NOTE — Progress Notes (Signed)
Glenda Thompson     MRN: [id number]      DOB: Jun 09, 1971   HPI Glenda Thompson is here for follow up and re-evaluation of chronic medical conditions, medication management and review of any available recent lab and radiology data.  Preventive health is updated, specifically  Cancer screening and Immunization.   Being treated by endo and reports FBG on average 80 Denies polyuria, polydipsia, blurred vision , c/o  hypoglycemic episodes.however blood sugar over 70 at those  Times States she is exerciing at least 5 days/ week and has changed diet with weight loss C/o irregular menses , wonders if perimenopausal , advised this is possible  The PT denies any adverse reactions to current medications since the last visit.     ROS Denies recent fever or chills. Denies sinus pressure, nasal congestion, ear pain or sore throat. Denies chest congestion, productive cough or wheezing. Denies chest pains, palpitations and leg swelling Denies abdominal pain, nausea, vomiting,diarrhea or constipation.   Denies dysuria, frequency, hesitancy or incontinence. Denies joint pain, swelling and limitation in mobility. Denies headaches, seizures, numbness, or tingling. Denies depression, anxiety or insomnia. Denies skin break down or rash.   PE  BP (!) 162/80   Pulse 75   Resp 12   Ht 5\' 5"  (1.651 m)   Wt 251 lb 0.6 oz (113.9 kg)   SpO2 97% Comment: room air  BMI 41.78 kg/m   Patient alert and oriented and in no cardiopulmonary distress.  HEENT: No facial asymmetry, EOMI,   oropharynx pink and moist.  Neck supple no JVD, no mass.  Chest: Clear to auscultation bilaterally.  CVS: S1, S2 no murmurs, no S3.Regular rate.  ABD: Soft non tender.   Ext: No edema  MS: Adequate ROM spine, shoulders, hips and knees.  Skin: Intact, no ulcerations or rash noted.  Psych: Good eye contact, normal affect. Memory intact not anxious or depressed appearing.  CNS: CN 2-12 intact, power,  normal  throughout.no focal deficits noted.   Assessment & Plan  Essential hypertension, benign uncontrolledincrease spironolactone dose DASH diet and commitment to daily physical activity for a minimum of 30 minutes discussed and encouraged, as a part of hypertension management. The importance of attaining a healthy weight is also discussed.  BP/Weight 11/26/2018 09/17/2018 08/30/2018 08/09/2018 05/21/2018 05/12/2018 01/08/2018  Systolic BP 162 - 148 145 142 035 150  Diastolic BP 80 - 82 84 70 75 84  Wt. (Lbs) 251.04 263 260 260 253.12 262 -  BMI 41.78 43.77 43.27 43.27 42.12 43.6 -       Type 2 diabetes mellitus with vascular disease (HCC) Uncontrolled though reports improvement, maanged by endo will note updated HBA1C Glenda Thompson is reminded of the importance of commitment to daily physical activity for 30 minutes or more, as able and the need to limit carbohydrate intake to 30 to 60 grams per meal to help with blood sugar control.   The need to take medication as prescribed, test blood sugar as directed, and to call between visits if there is a concern that blood sugar is uncontrolled is also discussed.   Glenda Thompson is reminded of the importance of daily foot exam, annual eye examination, and good blood sugar, blood pressure and cholesterol control.  Diabetic Labs Latest Ref Rng & Units 08/01/2018 05/12/2018 11/15/2017 09/23/2017 09/30/2016  HbA1c 4.0 - 6.0 9.7(A) 11.7(H) - 10.4(H) 9.0(H)  Microalbumin Not Estab. ug/mL - - <3.0(H) - -  Micro/Creat Ratio 0.0 - 30.0 mg/g creat - - <  9.3 - -  Chol 0 - 200 235(A) - - 248(H) -  HDL 35 - 70 63 - - 68 -  Calc LDL - 138 - - 153(H) -  Triglycerides 40 - 160 196(A) - - 138 -  Creatinine 0.5 - 1.1 0.9 0.95 - 0.78 0.89   BP/Weight 11/26/2018 09/17/2018 08/30/2018 08/09/2018 05/21/2018 05/12/2018 01/08/2018  Systolic BP 162 - 148 145 142 [id number] 150  Diastolic BP 80 - 82 84 70 75 84  Wt. (Lbs) 251.04 263 260 260 253.12 262 -  BMI 41.78 43.77 43.27 43.27 42.12  43.6 -   Foot/eye exam completion dates 11/26/2018 11/14/2017  Foot Form Completion Done Done        Morbid obesity Improved Patient re-educated about  the importance of commitment to a  minimum of 150 minutes of exercise per week.  The importance of healthy food choices with portion control discussed. Encouraged to start a food diary, count calories and to consider  joining a support group. Sample diet sheets offered. Goals set by the patient for the next several months.   Weight /BMI 11/26/2018 09/17/2018 08/30/2018  WEIGHT 251 lb 0.6 oz 263 lb 260 lb  HEIGHT 5\' 5"  5\' 5"  5\' 5"   BMI 41.78 kg/m2 43.77 kg/m2 43.27 kg/m2      Mixed hyperlipidemia Hyperlipidemia:Low fat diet discussed and encouraged.   Lipid Panel  Lab Results  Component Value Date   CHOL 235 (A) 08/01/2018   HDL 63 08/01/2018   LDLCALC 138 08/01/2018   TRIG 196 (A) 08/01/2018   CHOLHDL 3.6 09/23/2017  Updated lab needed at/ before next visit.

## 2018-11-26 NOTE — Assessment & Plan Note (Signed)
Hyperlipidemia:Low fat diet discussed and encouraged.   Lipid Panel  Lab Results  Component Value Date   CHOL 235 (A) 08/01/2018   HDL 63 08/01/2018   LDLCALC 138 08/01/2018   TRIG 196 (A) 08/01/2018   CHOLHDL 3.6 09/23/2017  Updated lab needed at/ before next visit.

## 2018-11-26 NOTE — Patient Instructions (Addendum)
Physical and pap in 3 months, call if you need me before  Please reschedule your mammogram which is past due Please also get fasting lipid and TSH the same day, this Wednesday morning  We will send for eye exam done 04/2018 at [facility] in [place]  Dose increase in spirolactone to 50 mg as blood pressure is high  It is important that you exercise regularly at least 30 minutes 5 times a week. If you develop chest pain, have severe difficulty breathing, or feel very tired, stop exercising immediately and seek medical attention   Think about what you will eat, plan ahead. Choose " clean, green, fresh or frozen" over canned, processed or packaged foods which are more sugary, salty and fatty. 70 to 75% of food eaten should be vegetables and fruit. Three meals at set times with snacks allowed between meals, but they must be fruit or vegetables. Aim to eat over a 12 hour period , example 7 am to 7 pm, and STOP after  your last meal of the day. Drink water,generally about 64 ounces per day, no other drink is as healthy. Fruit juice is best enjoyed in a healthy way, by EATING the fruit.

## 2018-11-26 NOTE — Assessment & Plan Note (Signed)
uncontrolledincrease spironolactone dose DASH diet and commitment to daily physical activity for a minimum of 30 minutes discussed and encouraged, as a part of hypertension management. The importance of attaining a healthy weight is also discussed.  BP/Weight 11/26/2018 09/17/2018 08/30/2018 08/09/2018 05/21/2018 05/12/2018 01/08/2018  Systolic BP 162 - 148 145 142 161 150  Diastolic BP 80 - 82 84 70 75 84  Wt. (Lbs) 251.04 263 260 260 253.12 262 -  BMI 41.78 43.77 43.27 43.27 42.12 43.6 -

## 2018-11-26 NOTE — Assessment & Plan Note (Signed)
Uncontrolled though reports improvement, maanged by endo will note updated HBA1C Glenda Thompson is reminded of the importance of commitment to daily physical activity for 30 minutes or more, as able and the need to limit carbohydrate intake to 30 to 60 grams per meal to help with blood sugar control.   The need to take medication as prescribed, test blood sugar as directed, and to call between visits if there is a concern that blood sugar is uncontrolled is also discussed.   Glenda Thompson is reminded of the importance of daily foot exam, annual eye examination, and good blood sugar, blood pressure and cholesterol control.  Diabetic Labs Latest Ref Rng & Units 08/01/2018 05/12/2018 11/15/2017 09/23/2017 09/30/2016  HbA1c 4.0 - 6.0 9.7(A) 11.7(H) - 10.4(H) 9.0(H)  Microalbumin Not Estab. ug/mL - - <3.0(H) - -  Micro/Creat Ratio 0.0 - 30.0 mg/g creat - - <9.3 - -  Chol 0 - 200 235(A) - - 248(H) -  HDL 35 - 70 63 - - 68 -  Calc LDL - 138 - - 153(H) -  Triglycerides 40 - 160 196(A) - - 138 -  Creatinine 0.5 - 1.1 0.9 0.95 - 0.78 0.89   BP/Weight 11/26/2018 09/17/2018 08/30/2018 08/09/2018 05/21/2018 05/12/2018 01/08/2018  Systolic BP 162 - 148 145 142 032 150  Diastolic BP 80 - 82 84 70 75 84  Wt. (Lbs) 251.04 263 260 260 253.12 262 -  BMI 41.78 43.77 43.27 43.27 42.12 43.6 -   Foot/eye exam completion dates 11/26/2018 11/14/2017  Foot Form Completion Done Done

## 2018-11-28 DIAGNOSIS — E1169 Type 2 diabetes mellitus with other specified complication: Secondary | ICD-10-CM | POA: Diagnosis not present

## 2018-11-28 DIAGNOSIS — E782 Mixed hyperlipidemia: Secondary | ICD-10-CM | POA: Diagnosis not present

## 2018-11-28 DIAGNOSIS — E1165 Type 2 diabetes mellitus with hyperglycemia: Secondary | ICD-10-CM | POA: Diagnosis not present

## 2018-11-29 ENCOUNTER — Other Ambulatory Visit: Payer: Self-pay | Admitting: "Endocrinology

## 2018-11-29 ENCOUNTER — Ambulatory Visit: Payer: BLUE CROSS/BLUE SHIELD | Admitting: "Endocrinology

## 2018-11-29 LAB — COMPLETE METABOLIC PANEL WITH GFR
AG Ratio: 1.4 (calc) (ref 1.0–2.5)
ALBUMIN MSPROF: 4.2 g/dL (ref 3.6–5.1)
ALT: 18 U/L (ref 6–29)
AST: 20 U/L (ref 10–35)
Alkaline phosphatase (APISO): 50 U/L (ref 31–125)
BUN: 19 mg/dL (ref 7–25)
CALCIUM: 9.8 mg/dL (ref 8.6–10.2)
CO2: 28 mmol/L (ref 20–32)
CREATININE: 0.85 mg/dL (ref 0.50–1.10)
Chloride: 103 mmol/L (ref 98–110)
GFR, EST AFRICAN AMERICAN: 94 mL/min/{1.73_m2} (ref >=60)
GFR, Est Non African American: 81 mL/min/{1.73_m2} (ref >=60)
GLUCOSE: 83 mg/dL (ref 65–99)
Globulin: 2.9 g/dL (calc) (ref 1.9–3.7)
Potassium: 4.3 mmol/L (ref 3.5–5.3)
Sodium: 141 mmol/L (ref 135–146)
TOTAL PROTEIN: 7.1 g/dL (ref 6.1–8.1)
Total Bilirubin: 0.4 mg/dL (ref 0.2–1.2)

## 2018-11-29 LAB — LIPID PANEL
CHOL/HDL RATIO: 2.3 (calc) (ref <5.0)
Cholesterol: 135 mg/dL (ref <200)
HDL: 59 mg/dL (ref >=50)
LDL CHOLESTEROL (CALC): 62 mg/dL
Non-HDL Cholesterol (Calc): 76 mg/dL (calc) (ref <130)
Triglycerides: 68 mg/dL (ref <150)

## 2018-11-29 LAB — HEMOGLOBIN A1C
Hgb A1c MFr Bld: 8.6 % of total Hgb — ABNORMAL HIGH (ref <5.7)
MEAN PLASMA GLUCOSE: 200 (calc)
eAG (mmol/L): 11.1 (calc)

## 2018-11-29 LAB — TSH: TSH: 0.9 mIU/L

## 2018-11-30 ENCOUNTER — Encounter: Payer: Self-pay | Admitting: Family Medicine

## 2018-12-03 ENCOUNTER — Encounter: Payer: BLUE CROSS/BLUE SHIELD | Attending: Family Medicine | Admitting: Nutrition

## 2018-12-03 ENCOUNTER — Encounter: Payer: Self-pay | Admitting: Nutrition

## 2018-12-03 VITALS — Ht 65.0 in | Wt 248.0 lb

## 2018-12-03 DIAGNOSIS — IMO0002 Reserved for concepts with insufficient information to code with codable children: Secondary | ICD-10-CM

## 2018-12-03 DIAGNOSIS — E669 Obesity, unspecified: Secondary | ICD-10-CM | POA: Insufficient documentation

## 2018-12-03 DIAGNOSIS — E1165 Type 2 diabetes mellitus with hyperglycemia: Secondary | ICD-10-CM

## 2018-12-03 DIAGNOSIS — E782 Mixed hyperlipidemia: Secondary | ICD-10-CM

## 2018-12-03 DIAGNOSIS — E118 Type 2 diabetes mellitus with unspecified complications: Secondary | ICD-10-CM | POA: Insufficient documentation

## 2018-12-03 NOTE — Progress Notes (Signed)
Medical Nutrition Therapy:  Appt start time: 0800 end time:  0900.   Assessment:  Primary concerns today: Diabetes Type 2.. PMH: Obesity, HTN and Hyperlipidemia.  Metformin 1000 mg BID. 40 units Levermier.  FBS 70's for the last 5 weeks. Has low blood sugars in middle of night most nights and has to treat with food and liquids to bring it up. BS before bed are 130-140's -  7pm She is working out most days of week now at [employer]. Has lost 15 lbs in the last three months. Eating a lot of salad, lean protein. Has cut out snacks and reduced portion sizes.  Workout and hour and half at [facility].A1C 8.6a% Cholesterol levels have improved as well.  Shared low bloods with DR. Nida and he advised to reduce Levemir to 30 units nightly instead of 40 units at night. Pt. Verbalized understanding.  Making excellent progress. Feels better. More energy.   Vitals with BMI 09/17/2018 08/30/2018 08/09/2018 05/21/2018 05/21/2018  Height 5\' 5"  5\' 5"  5\' 5"   5\' 5"   Weight 263 lbs 260 lbs 260 lbs  253 lbs 2 oz  BMI 43.77 43.27 43.27  42.12  Systolic  148 145 142 164  Diastolic  82 84 70 68  Pulse  78 84  93  espirations          Lab Results  Component Value Date   HGBA1C 8.6 (H) 11/28/2018   CMP Latest Ref Rng & Units 11/28/2018 08/01/2018 05/12/2018  Glucose 65 - 99 mg/dL 83 - 356(Y)  BUN 7 - 25 mg/dL 19 19 61(U)  Creatinine 0.50 - 1.10 mg/dL 8.37 0.9 2.90  Sodium 211 - 146 mmol/L 141 - 135  Potassium 3.5 - 5.3 mmol/L 4.3 - 4.0  Chloride 98 - 110 mmol/L 103 - 100  CO2 20 - 32 mmol/L 28 - 28  Calcium 8.6 - 10.2 mg/dL 9.8 - 9.2  Total Protein 6.1 - 8.1 g/dL 7.1 - -  Total Bilirubin 0.2 - 1.2 mg/dL 0.4 - -  Alkaline Phos 33 - 115 U/L - - -  AST 10 - 35 U/L 20 - -  ALT 6 - 29 U/L 18 - -   Lipid Panel     Component Value Date/Time   CHOL 135 11/28/2018 0727   TRIG 68 11/28/2018 0727   HDL 59 11/28/2018 0727   CHOLHDL 2.3 11/28/2018 0727   VLDL 11 10/02/2014 0920   LDLCALC 62 11/28/2018 0727    Preferred  Learning Style:    No preference indicated   Learning Readiness:   Ready  Change in progress   MEDICATIONS:  DIETARY INTAKE:  24-hr recall:  B ( AM)Oatmeals, egg, grilled chicken,  water Snk ( AM): halo or greek yogurt because she has to eat breakast so early at 5-6 am.  L ( PM): wrap low carb, chicken, salad and  Snk ( PM):  D ( PM): Toss salad, chicken, pecans, apple,  Snk ( PM):  Beverages: water  Usual physical activity: ADL  Estimated energy needs: 1200  calories 135 g carbohydrates 90 g protein 33 g fat  Progress Towards Goal(s):  In progress.   Nutritional Diagnosis:  NB-1.1 Food and nutrition-related knowledge deficit As related to Diabetes Type 2, .  As evidenced by A1C 9.7% and BMI > 40.    Intervention: Nutrition and Diabetes education provided on My Plate, CHO counting, meal planning, portion sizes, timing of meals, avoiding snacks between meals unless having a low blood sugar, target ranges for  A1C and blood sugars, signs/symptoms and treatment of hyper/hypoglycemia, monitoring blood sugars, taking medications as prescribed, benefits of exercising 30 minutes per day and prevention of complications of DM.  Goals  Reduce Levermi to 30 units daily.  Make sure you are eating 30 grams of carbs per meals.  Prevent low blood sugars.  Teaching Method Utilized:  Visual Auditory Hands on  Handouts given during visit include:  The Plate Method   Meal Plan Card   Barriers to learning/adherence to lifestyle change: none  Demonstrated degree of understanding via:  Teach Back   Monitoring/Evaluation:  Dietary intake, exercise, , and body weight in 3 month(s).

## 2018-12-03 NOTE — Patient Instructions (Addendum)
Goals  Reduce Levermi to 30 units daily.  Make sure you are eating 30 grams of carbs per meals.  Prevent low blood sugars.

## 2018-12-20 ENCOUNTER — Encounter: Payer: BLUE CROSS/BLUE SHIELD | Attending: Family Medicine | Admitting: Nutrition

## 2018-12-20 ENCOUNTER — Ambulatory Visit: Payer: BLUE CROSS/BLUE SHIELD | Admitting: "Endocrinology

## 2018-12-20 ENCOUNTER — Encounter: Payer: Self-pay | Admitting: "Endocrinology

## 2018-12-20 VITALS — BP 147/83 | HR 89 | Ht 65.0 in | Wt 245.0 lb

## 2018-12-20 DIAGNOSIS — E1165 Type 2 diabetes mellitus with hyperglycemia: Secondary | ICD-10-CM

## 2018-12-20 DIAGNOSIS — E782 Mixed hyperlipidemia: Secondary | ICD-10-CM

## 2018-12-20 DIAGNOSIS — E118 Type 2 diabetes mellitus with unspecified complications: Secondary | ICD-10-CM | POA: Insufficient documentation

## 2018-12-20 DIAGNOSIS — Z6841 Body Mass Index (BMI) 40.0 and over, adult: Secondary | ICD-10-CM

## 2018-12-20 DIAGNOSIS — I10 Essential (primary) hypertension: Secondary | ICD-10-CM

## 2018-12-20 DIAGNOSIS — IMO0002 Reserved for concepts with insufficient information to code with codable children: Secondary | ICD-10-CM

## 2018-12-20 NOTE — Progress Notes (Signed)
Medical Nutrition Therapy:  Appt start time: 1640end time:  1700  Assessment:  Primary concerns today: Diabetes Type 2.. PMH: Obesity, HTN and Hyperlipidemia. Lose 3 lbs Metformin 1000 mg BID. Saw Dr. Fransico Him..  Reduced insulin to 15 units of Toujeo per day starting today.  Lab Results  Component Value Date   HGBA1C 8.6 (H) 11/28/2018  Eating better balanced meals and on time. Still working out. Metformin 1000 mg ER daily. Making excellent progress. Feels better. More energy.   Vitals with BMI 09/17/2018 08/30/2018 08/09/2018 05/21/2018 05/21/2018  Height 5\' 5"  5\' 5"  5\' 5"   5\' 5"   Weight 263 lbs 260 lbs 260 lbs  253 lbs 2 oz  BMI 43.77 43.27 43.27  42.12  Systolic  148 145 142 164  Diastolic  82 84 70 68  Pulse  78 84  93  espirations          Lab Results  Component Value Date   HGBA1C 8.6 (H) 11/28/2018   CMP Latest Ref Rng & Units 11/28/2018 08/01/2018 05/12/2018  Glucose 65 - 99 mg/dL 83 - 239(R)  BUN 7 - 25 mg/dL 19 19 32(Y)  Creatinine 0.50 - 1.10 mg/dL 2.33 0.9 4.35  Sodium 686 - 146 mmol/L 141 - 135  Potassium 3.5 - 5.3 mmol/L 4.3 - 4.0  Chloride 98 - 110 mmol/L 103 - 100  CO2 20 - 32 mmol/L 28 - 28  Calcium 8.6 - 10.2 mg/dL 9.8 - 9.2  Total Protein 6.1 - 8.1 g/dL 7.1 - -  Total Bilirubin 0.2 - 1.2 mg/dL 0.4 - -  Alkaline Phos 33 - 115 U/L - - -  AST 10 - 35 U/L 20 - -  ALT 6 - 29 U/L 18 - -   Lipid Panel     Component Value Date/Time   CHOL 135 11/28/2018 0727   TRIG 68 11/28/2018 0727   HDL 59 11/28/2018 0727   CHOLHDL 2.3 11/28/2018 0727   VLDL 11 10/02/2014 0920   LDLCALC 62 11/28/2018 0727    Preferred Learning Style:    No preference indicated   Learning Readiness:   Ready  Change in progress   MEDICATIONS:  DIETARY INTAKE:  24-hr recall:  B ( AM)Oatmeals, egg, grilled chicken,  water Snk ( AM): halo or greek yogurt because she has to eat breakast so early at 5-6 am.  L ( PM): wrap low carb, chicken, salad and  Snk ( PM):  D ( PM): Toss  salad, chicken, pecans, apple,  Snk ( PM):  Beverages: water  Usual physical activity: ADL  Estimated energy needs: 1200  calories 135 g carbohydrates 90 g protein 33 g fat  Progress Towards Goal(s):  In progress.   Nutritional Diagnosis:  NB-1.1 Food and nutrition-related knowledge deficit As related to Diabetes Type 2, .  As evidenced by A1C 9.7% and BMI > 40.    Intervention: Nutrition and Diabetes education provided on My Plate, CHO counting, meal planning, portion sizes, timing of meals, avoiding snacks between meals unless having a low blood sugar, target ranges for A1C and blood sugars, signs/symptoms and treatment of hyper/hypoglycemia, monitoring blood sugars, taking medications as prescribed, benefits of exercising 30 minutes per day and prevention of complications of DM. Goals Toujeo 15 units a day. Keep up the great job!! Increase water intake Keep working out. Get A1C down to 7% or less Lose 4-5 lbs per month/  Teaching Method Utilized:  Visual Auditory Hands on  Handouts given during visit include:  The Plate Method   Meal Plan Card   Barriers to learning/adherence to lifestyle change: none  Demonstrated degree of understanding via:  Teach Back   Monitoring/Evaluation:  Dietary intake, exercise, , and body weight in 3 month(s).

## 2018-12-20 NOTE — Patient Instructions (Addendum)
Goals Toujeo 15 units a day. Keep up the great job!! Increase water intake Keep working out. Get A1C down to 7% or less Lose 4-5 lbs per month/

## 2018-12-20 NOTE — Patient Instructions (Signed)

## 2018-12-20 NOTE — Progress Notes (Signed)
Endocrinology Consult Note       12/20/2018, 4:50 PM   Subjective:    Patient ID: Glenda Thompson, female    DOB: 19-Jun-1971.  Glenda Thompson is being seen in consultation for management of currently uncontrolled symptomatic type 2 diabetes, requested by  Kerri Perches, MD.   Past Medical History:  Diagnosis Date  . Diabetes mellitus   . Hypertension    Past Surgical History:  Procedure Laterality Date  . CESAREAN SECTION     Social History   Socioeconomic History  . Marital status: Married    Spouse name: Not on file  . Number of children: Not on file  . Years of education: Not on file  . Highest education level: Not on file  Occupational History  . Not on file  Social Needs  . Financial resource strain: Not on file  . Food insecurity:    Worry: Not on file    Inability: Not on file  . Transportation needs:    Medical: Not on file    Non-medical: Not on file  Tobacco Use  . Smoking status: Never Smoker  . Smokeless tobacco: Never Used  Substance and Sexual Activity  . Alcohol use: Never    Frequency: Never  . Drug use: Never  . Sexual activity: Not on file  Lifestyle  . Physical activity:    Days per week: Not on file    Minutes per session: Not on file  . Stress: Not on file  Relationships  . Social connections:    Talks on phone: Not on file    Gets together: Not on file    Attends religious service: Not on file    Active member of club or organization: Not on file    Attends meetings of clubs or organizations: Not on file    Relationship status: Not on file  Other Topics Concern  . Not on file  Social History Narrative  . Not on file   Outpatient Encounter Medications as of 12/20/2018  Medication Sig  . amLODipine (NORVASC) 10 MG tablet Take 1 tablet (10 mg total) by mouth daily.  . fluticasone (FLONASE) 50 MCG/ACT nasal spray Place 2 sprays into both nostrils  daily.  Marland Kitchen glucose blood (FREESTYLE LITE) test strip USE TO TEST THREE TIMES DAILY  . Insulin Glargine, 1 Unit Dial, 300 UNIT/ML SOPN Inject 15 Units into the skin at bedtime.  . Insulin Pen Needle (B-D ULTRAFINE III SHORT PEN) 31G X 8 MM MISC Use as directed to inject insulin daily.  . Lancets (FREESTYLE) lancets TEST THREE TIMES DAILY  . lisinopril-hydrochlorothiazide (PRINZIDE,ZESTORETIC) 20-12.5 MG tablet TAKE 2 TABLETS BY MOUTH DAILY  . metFORMIN (GLUCOPHAGE XR) 500 MG 24 hr tablet Take 1,000 mg by mouth daily with breakfast.  . rosuvastatin (CRESTOR) 5 MG tablet TAKE 1 TABLET BY MOUTH EVERY DAY  . spironolactone (ALDACTONE) 50 MG tablet Take 1 tablet (50 mg total) by mouth daily.  . [DISCONTINUED] Insulin Detemir (LEVEMIR) 100 UNIT/ML Pen INJECT 40 UNITS INTO THE SKIN AT BEDTIME. (Patient not taking: Reported on 12/20/2018)   No facility-administered encounter medications on file  as of 12/20/2018.     ALLERGIES: Allergies  Allergen Reactions  . Metformin And Related Diarrhea    VACCINATION STATUS: Immunization History  Administered Date(s) Administered  . H1N1 09/24/2008  . Influenza Whole 09/07/2007, 07/29/2010  . Influenza,inj,Quad PF,6+ Mos 09/25/2013, 06/16/2014, 07/01/2016, 11/15/2017  . Pneumococcal Polysaccharide-23 09/07/2007, 10/12/2016  . Td 10/24/2001  . Tdap 10/12/2011    Diabetes  She presents for her follow-up diabetic visit. She has type 2 diabetes mellitus. Onset time: She was diagnosed at approximate age of 29 years. Her disease course has been improving (She had A1c of 11.7% in July 2019.  Most recent labs on August 01, 2018 was 9.7%.). There are no hypoglycemic associated symptoms. Pertinent negatives for hypoglycemia include no confusion, headaches, pallor or seizures. Pertinent negatives for diabetes include no chest pain, no fatigue, no polydipsia, no polyphagia and no polyuria. There are no hypoglycemic complications. Symptoms are improving. There are no  diabetic complications. Risk factors for coronary artery disease include diabetes mellitus, dyslipidemia, hypertension, obesity and sedentary lifestyle. Current diabetic treatment includes insulin injections and oral agent (dual therapy). Compliance with diabetes treatment: She was previously seen in this clinic for diabetes management.  Did not show up for more than 3 years. Her weight is fluctuating minimally. She is following a generally unhealthy diet. When asked about meal planning, she reported none. She has had a previous visit with a dietitian. She rarely participates in exercise. An ACE inhibitor/angiotensin II receptor blocker is being taken.  Hyperlipidemia  This is a chronic problem. The current episode started more than 1 year ago. The problem is uncontrolled. Recent lipid tests were reviewed and are high. Pertinent negatives include no chest pain, myalgias or shortness of breath. She is currently on no antihyperlipidemic treatment. Risk factors for coronary artery disease include diabetes mellitus, dyslipidemia, hypertension, a sedentary lifestyle and obesity.  Hypertension  This is a chronic problem. The problem is uncontrolled. Pertinent negatives include no chest pain, headaches, palpitations or shortness of breath. Risk factors for coronary artery disease include dyslipidemia, diabetes mellitus, obesity, sedentary lifestyle and family history. Past treatments include ACE inhibitors and calcium channel blockers.    Review of Systems  Constitutional: Negative for chills, fatigue, fever and unexpected weight change.  HENT: Negative for trouble swallowing and voice change.   Eyes: Negative for visual disturbance.  Respiratory: Negative for cough, shortness of breath and wheezing.   Cardiovascular: Negative for chest pain, palpitations and leg swelling.  Gastrointestinal: Negative for diarrhea, nausea and vomiting.  Endocrine: Negative for cold intolerance, heat intolerance, polydipsia,  polyphagia and polyuria.  Musculoskeletal: Negative for arthralgias and myalgias.  Skin: Negative for color change, pallor, rash and wound.  Neurological: Negative for seizures and headaches.  Psychiatric/Behavioral: Negative for confusion and suicidal ideas.    Objective:    BP (!) 147/83 (BP Location: Left Arm, Patient Position: Sitting, Cuff Size: Large)   Pulse 89   Ht  (1.651 m)   Wt 245 lb (111.1 kg)   SpO2 97%   BMI 40.77 kg/m   Wt Readings from Last 3 Encounters:  12/20/18 245 lb (111.1 kg)  12/03/18 248 lb (112.5 kg)  11/26/18 251 lb 0.6 oz (113.9 kg)     Physical Exam Constitutional:      Appearance: She is well-developed.  HENT:     Head: Normocephalic and atraumatic.  Neck:     Musculoskeletal: Normal range of motion and neck supple.     Thyroid: No thyromegaly.  Trachea: No tracheal deviation.  Cardiovascular:     Rate and Rhythm: Normal rate and regular rhythm.  Pulmonary:     Effort: Pulmonary effort is normal.  Abdominal:     Tenderness: There is no abdominal tenderness. There is no guarding.     Comments: Obese.  Musculoskeletal: Normal range of motion.  Skin:    General: Skin is warm and dry.     Coloration: Skin is not pale.     Findings: No erythema or rash.  Neurological:     Mental Status: She is alert and oriented to person, place, and time.     Cranial Nerves: No cranial nerve deficit.     Coordination: Coordination normal.     Deep Tendon Reflexes: Reflexes are normal and symmetric.  Psychiatric:        Judgment: Judgment normal.     CMP     Component Value Date/Time   NA 141 11/28/2018 0732   K 4.3 11/28/2018 0732   CL 103 11/28/2018 0732   CO2 28 11/28/2018 0732   GLUCOSE 83 11/28/2018 0732   BUN 19 11/28/2018 0732   BUN 19 08/01/2018   CREATININE 0.85 11/28/2018 0732   CALCIUM 9.8 11/28/2018 0732   PROT 7.1 11/28/2018 0732   ALBUMIN 4.0 03/08/2016 0910   AST 20 11/28/2018 0732   ALT 18 11/28/2018 0732   ALKPHOS  44 03/08/2016 0910   BILITOT 0.4 11/28/2018 0732   GFRNONAA 81 11/28/2018 0732   GFRAA 94 11/28/2018 0732   Diabetic Labs (most recent): Lab Results  Component Value Date   HGBA1C 8.6 (H) 11/28/2018   HGBA1C 9.7 (A) 08/01/2018   HGBA1C 11.7 (H) 05/12/2018     Lipid Panel ( most recent) Lipid Panel     Component Value Date/Time   CHOL 135 11/28/2018 0727   TRIG 68 11/28/2018 0727   HDL 59 11/28/2018 0727   CHOLHDL 2.3 11/28/2018 0727   VLDL 11 10/02/2014 0920   LDLCALC 62 11/28/2018 0727      Lab Results  Component Value Date   TSH 0.90 11/28/2018   TSH 0.737 10/02/2014   TSH 1.229 12/11/2012   TSH 1.079 10/12/2011   TSH 0.958 06/24/2010      Assessment & Plan:   1. Uncontrolled type 2 diabetes mellitus with hyperglycemia (HCC)  - HETTIE ROSELLI has currently uncontrolled symptomatic type 2 DM since 48 years of age. -She returns with significantly improved glycemic profile after she was put on basal insulin.  Her most recent A1c was 9.7%.   -her diabetes is complicated by nonadherence to recommendations and follow-ups,  obesity/sedentary life and she remains at extremely high risk for more acute and chronic complications which include CAD, CVA, CKD, retinopathy, and neuropathy. These are all discussed in detail with her.  - I have counseled her on diet management and weight loss, by adopting a carbohydrate restricted/protein rich diet.  -She still admits to dietary indiscretions, including consumption of sweets and sweetened beverages.  - Patient admits there is a room for improvement in her diet and drink choices. -  Suggestion is made for her to avoid simple carbohydrates  from her diet including Cakes, Sweet Desserts / Pastries, Ice Cream, Soda (diet and regular), Sweet Tea, Candies, Chips, Cookies, Store Bought Juices, Alcohol in Excess of  1-2 drinks a day, Artificial Sweeteners, and "Sugar-free" Products. This will help patient to have stable blood glucose  profile and potentially avoid unintended weight gain.  - I encouraged her  to switch to  unprocessed or minimally processed complex starch and increased protein intake (animal or plant source), fruits, and vegetables.  - she is advised to stick to a routine mealtimes to eat 3 meals  a day and avoid unnecessary snacks ( to snack only to correct hypoglycemia).   - she has been  scheduled with Norm Salt, RDN, CDE for individualized diabetes education.  - I have approached her with the following individualized plan to manage diabetes and patient agrees:   -Based on her presentation with tightly controlled fasting glycemic profile, she is advised to lower her Toujeo to 15 units nightly, continue monitoring blood glucose 2 times a day-daily before breakfast and at bedtime.   - she is warned not to take insulin without proper monitoring per orders.  - she is encouraged to call clinic for blood glucose levels less than 70 or above 300 mg /dl.  -She tolerated extended release metformin.  She is advised to continue  metformin 1000 mg ER daily after breakfast.    - she will be considered for incretin therapy as appropriate next visit. - Patient specific target  A1c;  LDL, HDL, Triglycerides, and  Waist Circumference were discussed in detail.  2) BP/HTN:  her blood pressure is not controlled to target.   she is advised to continue her current medications including lisinopril-HCTZ 20/12.5 mg p.o. daily with breakfast , as well as amlodipine 10 mg p.o. Daily.   3) Lipids/HPL:   Review of her recent lipid panel showed LDL of 138 uncontrolled.  She has tolerated a lower dose Crestor, advised to continue Crestor 5 mg p.o. nightly.    4)  Weight/Diet:  Body mass index is 40.77 kg/m.  - clearly complicating her diabetes care.  I discussed with her the fact that loss of 5 - 10% of her  current body weight will have the most impact on her diabetes management.  CDE Consult will be initiated . Exercise, and  detailed carbohydrates information provided  -  detailed on discharge instructions.  5) Chronic Care/Health Maintenance:  -she  is on ACEI/ARB and Statin medications and  is encouraged to initiate and continue to follow up with Ophthalmology, Dentist,  Podiatrist at least yearly or according to recommendations, and advised to stay away from smoking. I have recommended yearly flu vaccine and pneumonia vaccine at least every 5 years; moderate intensity exercise for up to 150 minutes weekly; and  sleep for at least 7 hours a day.  - I advised patient to maintain close follow up with Kerri Perches, MD for primary care needs.  - Time spent with the patient: 25 min, of which >50% was spent in reviewing her blood glucose logs , discussing her hypoglycemia and hyperglycemia episodes, reviewing her current and  previous labs / studies and medications  doses and developing a plan to avoid hypoglycemia and hyperglycemia. Please refer to Patient Instructions for Blood Glucose Monitoring and Insulin/Medications Dosing Guide"  in media tab for additional information. Glenda Thompson participated in the discussions, expressed understanding, and voiced agreement with the above plans.  All questions were answered to her satisfaction. she is encouraged to contact clinic should she have any questions or concerns prior to her return visit.    Follow up plan: - Return in about 4 months (around 04/20/2019) for Follow up with Pre-visit Labs, Meter, and Logs.  Marquis Lunch, MD [facility] [address] [place], [place] [address] Phone: [phone]  Fax: [phone]    12/20/2018, 4:50 PM  This note was partially dictated with voice recognition software. Similar sounding words can be transcribed inadequately or may not  be corrected upon review.

## 2018-12-24 ENCOUNTER — Other Ambulatory Visit: Payer: Self-pay | Admitting: Family Medicine

## 2018-12-25 ENCOUNTER — Encounter: Payer: Self-pay | Admitting: Nutrition

## 2019-01-29 ENCOUNTER — Other Ambulatory Visit: Payer: Self-pay | Admitting: Family Medicine

## 2019-02-08 ENCOUNTER — Other Ambulatory Visit: Payer: Self-pay | Admitting: Family Medicine

## 2019-02-26 ENCOUNTER — Encounter: Payer: Self-pay | Admitting: Family Medicine

## 2019-03-06 ENCOUNTER — Ambulatory Visit (INDEPENDENT_AMBULATORY_CARE_PROVIDER_SITE_OTHER): Payer: BLUE CROSS/BLUE SHIELD | Admitting: Family Medicine

## 2019-03-06 ENCOUNTER — Other Ambulatory Visit (HOSPITAL_COMMUNITY)
Admission: RE | Admit: 2019-03-06 | Discharge: 2019-03-06 | Disposition: A | Discharge status: Home / Self Care | Payer: BLUE CROSS/BLUE SHIELD | Source: Ambulatory Visit | Attending: Family Medicine | Admitting: Family Medicine

## 2019-03-06 ENCOUNTER — Other Ambulatory Visit: Payer: Self-pay

## 2019-03-06 ENCOUNTER — Encounter: Payer: BLUE CROSS/BLUE SHIELD | Admitting: Family Medicine

## 2019-03-06 ENCOUNTER — Encounter: Payer: Self-pay | Admitting: Family Medicine

## 2019-03-06 ENCOUNTER — Other Ambulatory Visit (HOSPITAL_COMMUNITY)
Admission: RE | Admit: 2019-03-06 | Discharge: 2019-03-06 | Disposition: A | Discharge status: Home / Self Care | Payer: BLUE CROSS/BLUE SHIELD | Source: Other Acute Inpatient Hospital | Attending: Family Medicine | Admitting: Family Medicine

## 2019-03-06 VITALS — BP 130/70 | HR 87 | Resp 15 | Ht 65.0 in | Wt 237.0 lb

## 2019-03-06 DIAGNOSIS — E1159 Type 2 diabetes mellitus with other circulatory complications: Secondary | ICD-10-CM | POA: Diagnosis not present

## 2019-03-06 DIAGNOSIS — Z0000 Encounter for general adult medical examination without abnormal findings: Secondary | ICD-10-CM | POA: Diagnosis not present

## 2019-03-06 DIAGNOSIS — M546 Pain in thoracic spine: Secondary | ICD-10-CM | POA: Insufficient documentation

## 2019-03-06 DIAGNOSIS — E1165 Type 2 diabetes mellitus with hyperglycemia: Secondary | ICD-10-CM

## 2019-03-06 DIAGNOSIS — Z1231 Encounter for screening mammogram for malignant neoplasm of breast: Secondary | ICD-10-CM

## 2019-03-06 MED ORDER — FLUTICASONE PROPIONATE 50 MCG/ACT NA SUSP: 2.0000 | Freq: Every day | NASAL | 12 refills | Status: DC

## 2019-03-06 MED ORDER — PHENTERMINE HCL 37.5 MG PO TABS: 37.5000 mg | ORAL_TABLET | Freq: Every day | ORAL | 1 refills | Status: DC

## 2019-03-06 NOTE — Assessment & Plan Note (Signed)
Glenda Thompson is reminded of the importance of commitment to daily physical activity for 30 minutes or more, as able and the need to limit carbohydrate intake to 30 to 60 grams per meal to help with blood sugar control.   The need to take medication as prescribed, test blood sugar as directed, and to call between visits if there is a concern that blood sugar is uncontrolled is also discussed.   Glenda Thompson is reminded of the importance of daily foot exam, annual eye examination, and good blood sugar, blood pressure and cholesterol control.  Diabetic Labs Latest Ref Rng & Units 11/28/2018 08/01/2018 05/12/2018 11/15/2017 09/23/2017  HbA1c <5.7 % of total Hgb 8.6(H) 9.7(A) 11.7(H) - 10.4(H)  Microalbumin Not Estab. ug/mL - - - <3.0(H) -  Micro/Creat Ratio 0.0 - 30.0 mg/g creat - - - <9.3 -  Chol <200 mg/dL 195 093(O) - - 671(I)  HDL > OR = 50 mg/dL 59 63 - - 68  Calc LDL mg/dL (calc) 62 458 - - 099(I)  Triglycerides <150 mg/dL 68 338(S) - - 505  Creatinine 0.50 - 1.10 mg/dL 3.97 0.9 6.73 - 4.19   BP/Weight 03/06/2019 12/20/2018 12/03/2018 11/26/2018 09/17/2018 08/30/2018 08/09/2018  Systolic BP 130 147 - 162 - 148 145  Diastolic BP 70 83 - 80 - 82 84  Wt. (Lbs) 237 245 248 251.04 263 260 260  BMI 39.44 40.77 41.27 41.78 43.77 43.27 43.27   Foot/eye exam completion dates 11/26/2018 11/14/2017  Foot Form Completion Done Done   Improved and managed by Endo, needs diabetic eye exam

## 2019-03-06 NOTE — Assessment & Plan Note (Signed)

## 2019-03-06 NOTE — Progress Notes (Signed)
Glenda Thompson     MRN: [id number]      DOB: 1971/03/19  HPI: Patient is in for annual physical exam. Thoracic back pain and obesity are addressed as concerns. Sttaes not exercising as in the past and wants suppressant to help as she works on food choice Denies polyuria, polydipsia, blurred vision , or hypoglycemic episodes.   PE:  BP 130/70   Pulse 87   Resp 15   Ht 5\' 5"  (1.651 m)   Wt 237 lb (107.5 kg)   SpO2 99%   BMI 39.44 kg/m   Pleasant  female, alert and oriented x 3, in no cardio-pulmonary distress. Afebrile. HEENT No facial trauma or asymetry. Sinuses non tender.  Extra occullar muscles intact, pupils equally reactive to light. External ears normal,  Neck: supple, no adenopathy,JVD or thyromegaly.No bruits.  Chest: Clear to ascultation bilaterally.No crackles or wheezes. Non tender to palpation  Breast: No asymetry,no masses or lumps. No tenderness. No nipple discharge or inversion. No axillary or supraclavicular adenopathy  Cardiovascular system; Heart sounds normal,  S1 and  S2 ,no S3.  No murmur, or thrill. Apical beat not displaced Peripheral pulses normal.  Abdomen: Soft, non tender, no organomegaly or masses. No bruits. Bowel sounds normal. No guarding, tenderness or rebound.  .  GU: External genitalia normal female genitalia , normal female distribution of hair. No lesions. Urethral meatus normal in size, no  Prolapse, no lesions visibly  Present. Bladder non tender. Vagina pink and moist , with no visible lesions , discharge present . Adequate pelvic support no  cystocele or rectocele noted Cervix pink and appears healthy, no lesions or ulcerations noted, no discharge noted from os Uterus normal size, no adnexal masses, no cervical motion or adnexal tenderness.   Musculoskeletal exam: Full ROM of spine, hips , shoulders and knees.Tender in thoracic spine No deformity ,swelling or crepitus noted. No muscle wasting or atrophy.    Neurologic: Cranial nerves 2 to 12 intact. Power, tone ,sensation and reflexes normal throughout. No disturbance in gait. No tremor.  Skin: Intact, no ulceration, erythema , scaling or rash noted. Pigmentation normal throughout  Psych; Normal mood and affect. Judgement and concentration normal   Assessment & Plan:  Annual physical exam Annual exam as documented. Counseling done  re healthy lifestyle involving commitment to 150 minutes exercise per week, heart healthy diet, and attaining healthy weight.The importance of adequate sleep also discussed. Regular seat belt use and home safety, is also discussed. Changes in health habits are decided on by the patient with goals and time frames  set for achieving them. Immunization and cancer screening needs are specifically addressed at this visit.   Type 2 diabetes mellitus with vascular disease [facility]) Glenda Thompson is reminded of the importance of commitment to daily physical activity for 30 minutes or more, as able and the need to limit carbohydrate intake to 30 to 60 grams per meal to help with blood sugar control.   The need to take medication as prescribed, test blood sugar as directed, and to call between visits if there is a concern that blood sugar is uncontrolled is also discussed.   Glenda Thompson is reminded of the importance of daily foot exam, annual eye examination, and good blood sugar, blood pressure and cholesterol control.  Diabetic Labs Latest Ref Rng & Units 11/28/2018 08/01/2018 05/12/2018 11/15/2017 09/23/2017  HbA1c <5.7 % of total Hgb 8.6(H) 9.7(A) 11.7(H) - 10.4(H)  Microalbumin Not Estab. ug/mL - - - <3.0(H) -  Micro/Creat Ratio 0.0 - 30.0 mg/g creat - - - <9.3 -  Chol <200 mg/dL 161135 096(E235(A) - - 454(U248(H)  HDL > OR = 50 mg/dL 59 63 - - 68  Calc LDL mg/dL (calc) 62 981138 - - 191(Y153(H)  Triglycerides <150 mg/dL 68 782(N196(A) - - 562138  Creatinine 0.50 - 1.10 mg/dL 1.300.85 0.9 8.650.95 - 7.840.78   BP/Weight 03/06/2019 12/20/2018 12/03/2018  11/26/2018 09/17/2018 08/30/2018 08/09/2018  Systolic BP 130 147 - 162 - 148 145  Diastolic BP 70 83 - 80 - 82 84  Wt. (Lbs) 237 245 248 251.04 263 260 260  BMI 39.44 40.77 41.27 41.78 43.77 43.27 43.27   Foot/eye exam completion dates 11/26/2018 11/14/2017  Foot Form Completion Done Done        Uncontrolled type 2 diabetes mellitus with hyperglycemia ([facility]) Glenda Thompson is reminded of the importance of commitment to daily physical activity for 30 minutes or more, as able and the need to limit carbohydrate intake to 30 to 60 grams per meal to help with blood sugar control.   The need to take medication as prescribed, test blood sugar as directed, and to call between visits if there is a concern that blood sugar is uncontrolled is also discussed.   Glenda Thompson is reminded of the importance of daily foot exam, annual eye examination, and good blood sugar, blood pressure and cholesterol control.  Diabetic Labs Latest Ref Rng & Units 11/28/2018 08/01/2018 05/12/2018 11/15/2017 09/23/2017  HbA1c <5.7 % of total Hgb 8.6(H) 9.7(A) 11.7(H) - 10.4(H)  Microalbumin Not Estab. ug/mL - - - <3.0(H) -  Micro/Creat Ratio 0.0 - 30.0 mg/g creat - - - <9.3 -  Chol <200 mg/dL 696135 295(M235(A) - - 841(L248(H)  HDL > OR = 50 mg/dL 59 63 - - 68  Calc LDL mg/dL (calc) 62 244138 - - 010(U153(H)  Triglycerides <150 mg/dL 68 725(D196(A) - - 664138  Creatinine 0.50 - 1.10 mg/dL 4.030.85 0.9 4.740.95 - 2.590.78   BP/Weight 03/06/2019 12/20/2018 12/03/2018 11/26/2018 09/17/2018 08/30/2018 08/09/2018  Systolic BP 130 147 - 162 - 148 145  Diastolic BP 70 83 - 80 - 82 84  Wt. (Lbs) 237 245 248 251.04 263 260 260  BMI 39.44 40.77 41.27 41.78 43.77 43.27 43.27   Foot/eye exam completion dates 11/26/2018 11/14/2017  Foot Form Completion Done Done   Improved and managed by Endo, needs diabetic eye exam     Back pain, thoracic One week history, with movement, tylenol ES one to two at night  Morbid obesity Improved, start half phentermine daily ,commit to  increased exercise and improved food choice.  Patient re-educated about  the importance of commitment to a  minimum of 150 minutes of exercise per week as able.  The importance of healthy food choices with portion control discussed, as well as eating regularly and within a 12 hour window most days. The need to choose "clean , green" food 50 to 75% of the time is discussed, as well as to make water the primary drink and set a goal of 64 ounces water daily.  Encouraged to start a food diary,  and to consider  joining a support group. Sample diet sheets offered. Goals set by the patient for the next several months.   Weight /BMI 03/06/2019 12/20/2018 12/03/2018  WEIGHT 237 lb 245 lb 248 lb  HEIGHT 5\' 5"  5\' 5"  5\' 5"   BMI 39.44 kg/m2 40.77 kg/m2 41.27 kg/m2

## 2019-03-06 NOTE — Assessment & Plan Note (Signed)
One week history, with movement, tylenol ES one to two at night

## 2019-03-06 NOTE — Assessment & Plan Note (Signed)
Glenda Thompson is reminded of the importance of commitment to daily physical activity for 30 minutes or more, as able and the need to limit carbohydrate intake to 30 to 60 grams per meal to help with blood sugar control.   The need to take medication as prescribed, test blood sugar as directed, and to call between visits if there is a concern that blood sugar is uncontrolled is also discussed.   Glenda Thompson is reminded of the importance of daily foot exam, annual eye examination, and good blood sugar, blood pressure and cholesterol control.  Diabetic Labs Latest Ref Rng & Units 11/28/2018 08/01/2018 05/12/2018 11/15/2017 09/23/2017  HbA1c <5.7 % of total Hgb 8.6(H) 9.7(A) 11.7(H) - 10.4(H)  Microalbumin Not Estab. ug/mL - - - <3.0(H) -  Micro/Creat Ratio 0.0 - 30.0 mg/g creat - - - <9.3 -  Chol <200 mg/dL 035 465(K) - - 812(X)  HDL > OR = 50 mg/dL 59 63 - - 68  Calc LDL mg/dL (calc) 62 517 - - 001(V)  Triglycerides <150 mg/dL 68 494(W) - - 967  Creatinine 0.50 - 1.10 mg/dL 5.91 0.9 6.38 - 4.66   BP/Weight 03/06/2019 12/20/2018 12/03/2018 11/26/2018 09/17/2018 08/30/2018 08/09/2018  Systolic BP 130 147 - 162 - 148 145  Diastolic BP 70 83 - 80 - 82 84  Wt. (Lbs) 237 245 248 251.04 263 260 260  BMI 39.44 40.77 41.27 41.78 43.77 43.27 43.27   Foot/eye exam completion dates 11/26/2018 11/14/2017  Foot Form Completion Done Done

## 2019-03-06 NOTE — Patient Instructions (Signed)
F/U ion 4 months, call if you need me sooner  Microalb from office today'  Tylenol ES tsake oer to two at bedtime for back pain ( samples provided)  You are referred for mammogram , past due  You are referred to Opthalmology in [place]    Congarts on taking charge of good changes for your health, keep it up!!  New is HALF phentermine daily to help with curbing appetite and weight loss which you are doing very well with, please commit to eating on a regular schedule , you are diabetic and that is very important  It is important that you exercise regularly at least 30 minutes 5 times a week. If you develop chest pain, have severe difficulty breathing, or feel very tired, stop exercising immediately and seek medical attention   Thanks for choosing [facility], we consider it a privelige to serve you.

## 2019-03-07 ENCOUNTER — Encounter: Payer: BLUE CROSS/BLUE SHIELD | Admitting: Family Medicine

## 2019-03-07 LAB — MICROALBUMIN / CREATININE URINE RATIO
Creatinine, Urine: 23.4 mg/dL
Microalb Creat Ratio: <13 mg/g creat (ref 0–29)
Microalb, Ur: <3 ug/mL — ABNORMAL HIGH

## 2019-03-08 ENCOUNTER — Encounter: Payer: Self-pay | Admitting: Family Medicine

## 2019-03-08 LAB — CYTOLOGY - PAP
Adequacy: ABSENT
Diagnosis: NEGATIVE
HPV: NOT DETECTED

## 2019-03-08 NOTE — Assessment & Plan Note (Signed)
Improved, start half phentermine daily ,commit to increased exercise and improved food choice.  Patient re-educated about  the importance of commitment to a  minimum of 150 minutes of exercise per week as able.  The importance of healthy food choices with portion control discussed, as well as eating regularly and within a 12 hour window most days. The need to choose "clean , green" food 50 to 75% of the time is discussed, as well as to make water the primary drink and set a goal of 64 ounces water daily.  Encouraged to start a food diary,  and to consider  joining a support group. Sample diet sheets offered. Goals set by the patient for the next several months.   Weight /BMI 03/06/2019 12/20/2018 12/03/2018  WEIGHT 237 lb 245 lb 248 lb  HEIGHT 5\' 5"  5\' 5"  5\' 5"   BMI 39.44 kg/m2 40.77 kg/m2 41.27 kg/m2

## 2019-03-14 ENCOUNTER — Ambulatory Visit (HOSPITAL_COMMUNITY): Payer: BLUE CROSS/BLUE SHIELD

## 2019-04-03 ENCOUNTER — Ambulatory Visit (HOSPITAL_COMMUNITY)
Admission: RE | Admit: 2019-04-03 | Discharge: 2019-04-03 | Disposition: A | Discharge status: Home / Self Care | Payer: BC Managed Care – PPO | Source: Ambulatory Visit | Attending: Family Medicine | Admitting: Family Medicine

## 2019-04-03 ENCOUNTER — Other Ambulatory Visit: Payer: Self-pay

## 2019-04-03 DIAGNOSIS — Z1231 Encounter for screening mammogram for malignant neoplasm of breast: Secondary | ICD-10-CM | POA: Diagnosis not present

## 2019-04-10 DIAGNOSIS — E1165 Type 2 diabetes mellitus with hyperglycemia: Secondary | ICD-10-CM | POA: Diagnosis not present

## 2019-04-10 DIAGNOSIS — E1159 Type 2 diabetes mellitus with other circulatory complications: Secondary | ICD-10-CM | POA: Diagnosis not present

## 2019-04-11 DIAGNOSIS — E119 Type 2 diabetes mellitus without complications: Secondary | ICD-10-CM | POA: Diagnosis not present

## 2019-04-11 DIAGNOSIS — H25093 Other age-related incipient cataract, bilateral: Secondary | ICD-10-CM | POA: Diagnosis not present

## 2019-04-11 LAB — COMPLETE METABOLIC PANEL WITH GFR
AG Ratio: 1.6 (calc) (ref 1.0–2.5)
ALT: 12 U/L (ref 6–29)
AST: 16 U/L (ref 10–35)
Albumin: 4.3 g/dL (ref 3.6–5.1)
Alkaline phosphatase (APISO): 49 U/L (ref 31–125)
BILIRUBIN TOTAL: 0.5 mg/dL (ref 0.2–1.2)
BUN: 22 mg/dL (ref 7–25)
CO2: 26 mmol/L (ref 20–32)
Calcium: 9.8 mg/dL (ref 8.6–10.2)
Chloride: 100 mmol/L (ref 98–110)
Creat: 1.09 mg/dL (ref 0.50–1.10)
GFR, EST AFRICAN AMERICAN: 70 mL/min/{1.73_m2} (ref >=60)
GFR, Est Non African American: 60 mL/min/{1.73_m2} (ref >=60)
GLUCOSE: 135 mg/dL — AB (ref 65–99)
Globulin: 2.7 g/dL (calc) (ref 1.9–3.7)
Potassium: 4.6 mmol/L (ref 3.5–5.3)
Sodium: 135 mmol/L (ref 135–146)
Total Protein: 7 g/dL (ref 6.1–8.1)

## 2019-04-11 LAB — MICROALBUMIN / CREATININE URINE RATIO
Creatinine, Urine: 258 mg/dL (ref 20–275)
Microalb Creat Ratio: 6 mcg/mg creat (ref <30)
Microalb, Ur: 1.5 mg/dL

## 2019-04-11 LAB — HM DIABETES EYE EXAM

## 2019-04-11 LAB — HEMOGLOBIN A1C
Hgb A1c MFr Bld: 6.3 % of total Hgb — ABNORMAL HIGH (ref <5.7)
Mean Plasma Glucose: 134 (calc)
eAG (mmol/L): 7.4 (calc)

## 2019-04-17 ENCOUNTER — Other Ambulatory Visit: Payer: Self-pay

## 2019-04-17 ENCOUNTER — Ambulatory Visit (INDEPENDENT_AMBULATORY_CARE_PROVIDER_SITE_OTHER): Payer: Self-pay | Admitting: "Endocrinology

## 2019-04-17 ENCOUNTER — Encounter: Payer: Self-pay | Admitting: "Endocrinology

## 2019-04-17 DIAGNOSIS — E782 Mixed hyperlipidemia: Secondary | ICD-10-CM

## 2019-04-17 DIAGNOSIS — E1165 Type 2 diabetes mellitus with hyperglycemia: Secondary | ICD-10-CM

## 2019-04-17 DIAGNOSIS — I10 Essential (primary) hypertension: Secondary | ICD-10-CM

## 2019-04-17 NOTE — Progress Notes (Signed)
04/17/2019, 2:40 PM                                                    Endocrinology Telehealth Visit Follow up Note -During COVID -19 Pandemic  This visit type was conducted due to national recommendations for restrictions regarding the COVID-19 Pandemic  in an effort to limit this patient's exposure and mitigate transmission of the corona virus.  Due to her co-morbid illnesses, Glenda Thompson is at  moderate to high risk for complications without adequate follow up.  This format is felt to be most appropriate for her at this time.  I connected with this patient on 04/17/2019   by telephone and verified that I am speaking with the correct person using two identifiers. Glenda Thompson, December 04, 1970. she has verbally consented to this visit. All issues noted in this document were discussed and addressed. The format was not optimal for physical exam.    Subjective:    Patient ID: Glenda Thompson, female    DOB: December 04, 1970.  Glenda Thompson is being engaged in telehealth in follow-up for management of currently controlled asymptomatic type 2 diabetes, and associated hyperlipidemia and hypertension. PMDSimpson, Milus MallickMargaret E, MD.   Past Medical History:  Diagnosis Date  . Diabetes mellitus   . Hypertension    Past Surgical History:  Procedure Laterality Date  . CESAREAN SECTION     Social History   Socioeconomic History  . Marital status: Married    Spouse name: Not on file  . Number of children: Not on file  . Years of education: Not on file  . Highest education level: Not on file  Occupational History  . Not on file  Social Needs  . Financial resource strain: Not on file  . Food insecurity    Worry: Not on file    Inability: Not on file  . Transportation needs    Medical: Not on file    Non-medical: Not on file  Tobacco Use  . Smoking status: Never Smoker  . Smokeless tobacco: Never Used   Substance and Sexual Activity  . Alcohol use: Never    Frequency: Never  . Drug use: Never  . Sexual activity: Not on file  Lifestyle  . Physical activity    Days per week: Not on file    Minutes per session: Not on file  . Stress: Not on file  Relationships  . Social Musicianconnections    Talks on phone: Not on file    Gets together: Not on file    Attends religious service: Not on file    Active member of club or organization: Not on file    Attends meetings of clubs or organizations: Not on file    Relationship status: Not on file  Other Topics Concern  . Not on file  Social History Narrative  . Not on file   Outpatient Encounter Medications as of 04/17/2019  Medication Sig  .  amLODipine (NORVASC) 10 MG tablet TAKE 1 TABLET(10 MG) BY MOUTH DAILY  . fluticasone (FLONASE) 50 MCG/ACT nasal spray Place 2 sprays into both nostrils daily.  Marland Kitchen glucose blood (FREESTYLE LITE) test strip USE TO TEST THREE TIMES DAILY  . Insulin Pen Needle (B-D ULTRAFINE III SHORT PEN) 31G X 8 MM MISC Use as directed to inject insulin daily.  . Lancets (FREESTYLE) lancets USE TO OBTAIN A BLOOD SPECIMEN TO TEST BLOOD SUGAR TWICE A DAY  . lisinopril-hydrochlorothiazide (PRINZIDE,ZESTORETIC) 20-12.5 MG tablet TAKE 2 TABLETS BY MOUTH DAILY  . metFORMIN (GLUCOPHAGE XR) 500 MG 24 hr tablet Take 1,000 mg by mouth daily with breakfast.  . phentermine (ADIPEX-P) 37.5 MG tablet Take 1 tablet (37.5 mg total) by mouth daily before breakfast.  . rosuvastatin (CRESTOR) 5 MG tablet TAKE 1 TABLET BY MOUTH EVERY DAY  . spironolactone (ALDACTONE) 50 MG tablet Take 1 tablet (50 mg total) by mouth daily.  . [DISCONTINUED] Insulin Glargine, 1 Unit Dial, 300 UNIT/ML SOPN Inject 15 Units into the skin at bedtime.   No facility-administered encounter medications on file as of 04/17/2019.     ALLERGIES: Allergies  Allergen Reactions  . Metformin And Related Diarrhea    VACCINATION STATUS: Immunization History  Administered  Date(s) Administered  . H1N1 09/24/2008  . Influenza Whole 09/07/2007, 07/29/2010  . Influenza,inj,Quad PF,6+ Mos 09/25/2013, 06/16/2014, 07/01/2016, 11/15/2017  . Pneumococcal Polysaccharide-23 09/07/2007, 10/12/2016  . Td 10/24/2001  . Tdap 10/12/2011    Diabetes She presents for her follow-up diabetic visit. She has type 2 diabetes mellitus. Onset time: She was diagnosed at approximate age of 36 years. Her disease course has been improving (She had A1c of 11.7% in July 2019.  Most recent labs on August 01, 2018 was 9.7%.). There are no hypoglycemic associated symptoms. Pertinent negatives for hypoglycemia include no confusion, headaches, pallor or seizures. Pertinent negatives for diabetes include no chest pain, no fatigue, no polydipsia, no polyphagia and no polyuria. There are no hypoglycemic complications. Symptoms are improving. There are no diabetic complications. Risk factors for coronary artery disease include diabetes mellitus, dyslipidemia, hypertension, obesity and sedentary lifestyle. Current diabetic treatment includes insulin injections and oral agent (dual therapy). Compliance with diabetes treatment: She was previously seen in this clinic for diabetes management.  Did not show up for more than 3 years. Her weight is fluctuating minimally. She is following a generally unhealthy diet. When asked about meal planning, she reported none. She has had a previous visit with a dietitian. She rarely participates in exercise. An ACE inhibitor/angiotensin II receptor blocker is being taken.  Hyperlipidemia This is a chronic problem. The current episode started more than 1 year ago. The problem is uncontrolled. Recent lipid tests were reviewed and are high. Pertinent negatives include no chest pain, myalgias or shortness of breath. She is currently on no antihyperlipidemic treatment. Risk factors for coronary artery disease include diabetes mellitus, dyslipidemia, hypertension, a sedentary lifestyle  and obesity.  Hypertension This is a chronic problem. The problem is uncontrolled. Pertinent negatives include no chest pain, headaches, palpitations or shortness of breath. Risk factors for coronary artery disease include dyslipidemia, diabetes mellitus, obesity, sedentary lifestyle and family history. Past treatments include ACE inhibitors and calcium channel blockers.    Review of Systems  Constitutional: Negative for chills, fatigue, fever and unexpected weight change.  HENT: Negative for trouble swallowing and voice change.   Eyes: Negative for visual disturbance.  Respiratory: Negative for cough, shortness of breath and wheezing.   Cardiovascular: Negative  for chest pain, palpitations and leg swelling.  Gastrointestinal: Negative for diarrhea, nausea and vomiting.  Endocrine: Negative for cold intolerance, heat intolerance, polydipsia, polyphagia and polyuria.  Musculoskeletal: Negative for arthralgias and myalgias.  Skin: Negative for color change, pallor, rash and wound.  Neurological: Negative for seizures and headaches.  Psychiatric/Behavioral: Negative for confusion and suicidal ideas.    Objective:    There were no vitals taken for this visit.  Wt Readings from Last 3 Encounters:  03/06/19 237 lb (107.5 kg)  12/20/18 245 lb (111.1 kg)  12/03/18 248 lb (112.5 kg)      CMP     Component Value Date/Time   NA 135 04/10/2019 0710   K 4.6 04/10/2019 0710   CL 100 04/10/2019 0710   CO2 26 04/10/2019 0710   GLUCOSE 135 (H) 04/10/2019 0710   BUN 22 04/10/2019 0710   BUN 19 08/01/2018   CREATININE 1.09 04/10/2019 0710   CALCIUM 9.8 04/10/2019 0710   PROT 7.0 04/10/2019 0710   ALBUMIN 4.0 03/08/2016 0910   AST 16 04/10/2019 0710   ALT 12 04/10/2019 0710   ALKPHOS 44 03/08/2016 0910   BILITOT 0.5 04/10/2019 0710   GFRNONAA 60 04/10/2019 0710   GFRAA 70 04/10/2019 0710   Diabetic Labs (most recent): Lab Results  Component Value Date   HGBA1C 6.3 (H) 04/10/2019    HGBA1C 8.6 (H) 11/28/2018   HGBA1C 9.7 (A) 08/01/2018     Lipid Panel ( most recent) Lipid Panel     Component Value Date/Time   CHOL 135 11/28/2018 0727   TRIG 68 11/28/2018 0727   HDL 59 11/28/2018 0727   CHOLHDL 2.3 11/28/2018 0727   VLDL 11 10/02/2014 0920   LDLCALC 62 11/28/2018 0727      Lab Results  Component Value Date   TSH 0.90 11/28/2018   TSH 0.737 10/02/2014   TSH 1.229 12/11/2012   TSH 1.079 10/12/2011   TSH 0.958 06/24/2010      Assessment & Plan:   1. Uncontrolled type 2 diabetes mellitus with hyperglycemia (HCC)  - Glenda Thompson has currently uncontrolled symptomatic type 2 DM since 48 years of age. -She reports controlled glycemic profile averaging 125 mg per DL, M5HA1c of 8.4%6.3% progressively improving from 9.7%.   She has not taken insulin for the last several weeks.   -her diabetes is complicated by prior history of nonadherence to recommendations and follow-ups,  obesity/sedentary life and she remains at extremely high risk for more acute and chronic complications which include CAD, CVA, CKD, retinopathy, and neuropathy. These are all discussed in detail with her.  - I have counseled her on diet management and weight loss, by adopting a carbohydrate restricted/protein rich diet.  -She still admits to dietary indiscretions, including consumption of sweets and sweetened beverages.  - she  admits there is a room for improvement in her diet and drink choices. -  Suggestion is made for her to avoid simple carbohydrates  from her diet including Cakes, Sweet Desserts / Pastries, Ice Cream, Soda (diet and regular), Sweet Tea, Candies, Chips, Cookies, Sweet Pastries,  Store Bought Juices, Alcohol in Excess of  1-2 drinks a day, Artificial Sweeteners, Coffee Creamer, and "Sugar-free" Products. This will help patient to have stable blood glucose profile and potentially avoid unintended weight gain.   - I encouraged her to switch to  unprocessed or minimally  processed complex starch and increased protein intake (animal or plant source), fruits, and vegetables.  - she is advised  to stick to a routine mealtimes to eat 3 meals  a day and avoid unnecessary snacks ( to snack only to correct hypoglycemia).   - she has been  scheduled with Norm SaltPenny Crumpton, RDN, CDE for individualized diabetes education.  - I have approached her with the following individualized plan to manage diabetes and patient agrees:   -Based on her presentation with tightly controlled fasting glycemic profile, she is being taken off of insulin treatment for now.   -She will however continue metformin 1000 mg ER p.o. daily after breakfast.  She promises to stay engaged in lifestyle modification.   - she is encouraged to call clinic for blood glucose levels less than 70 or above 300 mg /dl.   - she will be considered for incretin therapy as appropriate next visit. - Patient specific target  A1c;  LDL, HDL, Triglycerides, and  Waist Circumference were discussed in detail.  2) BP/HTN:  she is advised to home monitor blood pressure and report if > 140/90 on 2 separate readings.   she is advised to continue her current medications including lisinopril-HCTZ 20/12.5 mg p.o. daily with breakfast , as well as amlodipine 10 mg p.o. Daily.   3) Lipids/HPL:   Review of her recent lipid panel showed LDL of 138 uncontrolled.  She has tolerated a lower dose Crestor, advised to continue Crestor 5 mg p.o. nightly.    4)  Weight/Diet: Her BMI is 40+.  Clearly complicating her diabetes care.  I discussed with her the fact that loss of 5 - 10% of her  current body weight will have the most impact on her diabetes management.  CDE Consult will be initiated . Exercise, and detailed carbohydrates information provided  -  detailed on discharge instructions.  5) Chronic Care/Health Maintenance:  -she  is on ACEI/ARB and Statin medications and  is encouraged to initiate and continue to follow up with  Ophthalmology, Dentist,  Podiatrist at least yearly or according to recommendations, and advised to stay away from smoking. I have recommended yearly flu vaccine and pneumonia vaccine at least every 5 years; moderate intensity exercise for up to 150 minutes weekly; and  sleep for at least 7 hours a day.  - I advised patient to maintain close follow up with Kerri PerchesSimpson, Margaret E, MD for primary care needs.  - Patient Care Time Today:  25 min, of which >50% was spent in reviewing her  current and  previous labs/studies, previous treatments, and medications doses and developing a plan for long-term care based on the latest recommendations for standards of care.  Glenda Thompson participated in the discussions, expressed understanding, and voiced agreement with the above plans.  All questions were answered to her satisfaction. she is encouraged to contact clinic should she have any questions or concerns prior to her return visit.   Follow up plan: - Return in about 6 months (around 10/17/2019) for Follow up with Pre-visit Labs.  Marquis LunchGebre Donnald Tabar, MD [facility] [address] [place], [place] [address] Phone: [phone]  Fax: [phone]    04/17/2019, 2:40 PM  This note was partially dictated with voice recognition software. Similar sounding words can be transcribed inadequately or may not  be corrected upon review.

## 2019-05-21 ENCOUNTER — Other Ambulatory Visit: Payer: Self-pay | Admitting: "Endocrinology

## 2019-06-26 ENCOUNTER — Ambulatory Visit: Payer: BLUE CROSS/BLUE SHIELD | Admitting: Family Medicine

## 2019-07-03 ENCOUNTER — Ambulatory Visit (INDEPENDENT_AMBULATORY_CARE_PROVIDER_SITE_OTHER): Payer: BC Managed Care – PPO | Admitting: Family Medicine

## 2019-07-03 ENCOUNTER — Other Ambulatory Visit: Payer: Self-pay

## 2019-07-03 VITALS — BP 120/76 | Ht 65.0 in | Wt 225.0 lb

## 2019-07-03 DIAGNOSIS — I10 Essential (primary) hypertension: Secondary | ICD-10-CM | POA: Diagnosis not present

## 2019-07-03 DIAGNOSIS — E782 Mixed hyperlipidemia: Secondary | ICD-10-CM

## 2019-07-03 DIAGNOSIS — E1159 Type 2 diabetes mellitus with other circulatory complications: Secondary | ICD-10-CM

## 2019-07-03 NOTE — Patient Instructions (Signed)
F/U with MD, call if you need me before  Please get fasting lipid and CBC in December on the  Same day as you get labs for Dr Dorris Fetch [facility] lab)  Congrats on weight loss and improved health, keep it up!  Nurse Velna Hatchet) to arrange for flu vaccine   It is important that you exercise regularly at least 30 minutes 5 times a week. If you develop chest pain, have severe difficulty breathing, or feel very tired, stop exercising immediately and seek medical attention   Think about what you will eat, plan ahead. Choose " clean, green, fresh or frozen" over canned, processed or packaged foods which are more sugary, salty and fatty. 70 to 75% of food eaten should be vegetables and fruit. Three meals at set times with snacks allowed between meals, but they must be fruit or vegetables. Aim to eat over a 12 hour period , example 7 am to 7 pm, and STOP after  your last meal of the day. Drink water,generally about 64 ounces per day, no other drink is as healthy. Fruit juice is best enjoyed in a healthy way, by EATING the fruit.

## 2019-07-03 NOTE — Progress Notes (Signed)
Virtual Visit via Telephone Note  I connected with SHANEE BATCH on 07/03/19 at  4:00 PM EDT by telephone and verified that I am speaking with the correct person using two identifiers.  Location: Patient: Being driven in car Provider: office   I discussed the limitations, risks, security and privacy concerns of performing an evaluation and management service by telephone and the availability of in person appointments. I also discussed with the patient that there may be a patient responsible charge related to this service. The patient expressed understanding and agreed to proceed.   History of Present Illness: F/u chronic problems States she has lost 50 pounds, has changed food choice, is committed to regular exercise and has jhad excellent blood sugar control with reduced medication Feels 100 % better Denies polyuria, polydipsia, blurred vision , or hypoglycemic episodes. Denies recent fever or chills. Denies sinus pressure, nasal congestion, ear pain or sore throat. Denies chest congestion, productive cough or wheezing. Denies chest pains, palpitations and leg swelling Denies abdominal pain, nausea, vomiting,diarrhea or constipation.   Denies dysuria, frequency, hesitancy or incontinence. Denies joint pain, swelling and limitation in mobility. Denies headaches, seizures, numbness, or tingling. Denies depression, anxiety or insomnia. Denies skin break down or rash.       Observations/Objective: BP 120/76   Ht 5\' 5"  (1.651 m)   Wt 225 lb (102.1 kg)   BMI 37.44 kg/m  Good communication with no confusion and intact memory. Alert and oriented x 3 No signs of respiratory distress during speech    Assessment and Plan: Essential hypertension, benign Controlled, no change in medication DASH diet and commitment to daily physical activity for a minimum of 30 minutes discussed and encouraged, as a part of hypertension management. The importance of attaining a healthy weight is  also discussed.  BP/Weight 07/03/2019 03/06/2019 12/20/2018 12/03/2018 11/26/2018 09/17/2018 53/03/1442  Systolic BP 154 008 676 - 195 - 093  Diastolic BP 76 70 83 - 80 - 82  Wt. (Lbs) 225 237 245 248 251.04 263 260  BMI 37.44 39.44 40.77 41.27 41.78 43.77 43.27       Type 2 diabetes mellitus with vascular disease ([place]) Managed by endo, well controlled,  Ms. Golladay is reminded of the importance of commitment to daily physical activity for 30 minutes or more, as able and the need to limit carbohydrate intake to 30 to 60 grams per meal to help with blood sugar control.   The need to take medication as prescribed, test blood sugar as directed, and to call between visits if there is a concern that blood sugar is uncontrolled is also discussed.   Ms. Furuta is reminded of the importance of daily foot exam, annual eye examination, and good blood sugar, blood pressure and cholesterol control.  Diabetic Labs Latest Ref Rng & Units 04/10/2019 03/06/2019 11/28/2018 08/01/2018 05/12/2018  HbA1c <5.7 % of total Hgb 6.3(H) - 8.6(H) 9.7(A) 11.7(H)  Microalbumin mg/dL 1.5 <3.0(H) - - -  Micro/Creat Ratio <30 mcg/mg creat 6 <13 - - -  Chol <200 mg/dL - - 135 235(A) -  HDL > OR = 50 mg/dL - - 59 63 -  Calc LDL mg/dL (calc) - - 62 138 -  Triglycerides <150 mg/dL - - 68 196(A) -  Creatinine 0.50 - 1.10 mg/dL 1.09 - 0.85 0.9 0.95   BP/Weight 07/03/2019 03/06/2019 12/20/2018 12/03/2018 11/26/2018 09/17/2018 26/04/1244  Systolic BP 809 983 382 - 505 - 397  Diastolic BP 76 70 83 - 80 - 82  Wt. (Lbs) 225 237 245 248 251.04 263 260  BMI 37.44 39.44 40.77 41.27 41.78 43.77 43.27   Foot/eye exam completion dates Latest Ref Rng & Units 04/11/2019 11/26/2018  Eye Exam No Retinopathy No Retinopathy -  Foot Form Completion - - Done        Morbid obesity Obesity linked with hTN and diabetes  Patient re-educated about  the importance of commitment to a  minimum of 150 minutes of exercise per week as able.  The  importance of healthy food choices with portion control discussed, as well as eating regularly and within a 12 hour window most days. The need to choose "clean , green" food 50 to 75% of the time is discussed, as well as to make water the primary drink and set a goal of 64 ounces water daily.    Weight /BMI 07/03/2019 03/06/2019 12/20/2018  WEIGHT 225 lb 237 lb 245 lb  HEIGHT 5\' 5"  5\' 5"  5\' 5"   BMI 37.44 kg/m2 39.44 kg/m2 40.77 kg/m2      Mixed hyperlipidemia Hyperlipidemia:Low fat diet discussed and encouraged.   Lipid Panel  Lab Results  Component Value Date   CHOL 135 11/28/2018   HDL 59 11/28/2018   LDLCALC 62 11/28/2018   TRIG 68 11/28/2018   CHOLHDL 2.3 11/28/2018  Controlled, no change in medication Updated lab needed at/ before next visit.;       Follow Up Instructions:    I discussed the assessment and treatment plan with the patient. The patient was provided an opportunity to ask questions and all were answered. The patient agreed with the plan and demonstrated an understanding of the instructions.   The patient was advised to call back or seek an in-person evaluation if the symptoms worsen or if the condition fails to improve as anticipated.  I provided 22 minutes of non-face-to-face time during this encounter.   Syliva OvermanMargaret Simpson, MD

## 2019-07-04 ENCOUNTER — Other Ambulatory Visit: Payer: Self-pay

## 2019-07-04 ENCOUNTER — Ambulatory Visit (INDEPENDENT_AMBULATORY_CARE_PROVIDER_SITE_OTHER): Payer: BC Managed Care – PPO

## 2019-07-04 ENCOUNTER — Encounter: Payer: Self-pay | Admitting: Family Medicine

## 2019-07-04 DIAGNOSIS — E1059 Type 1 diabetes mellitus with other circulatory complications: Secondary | ICD-10-CM | POA: Insufficient documentation

## 2019-07-04 DIAGNOSIS — Z23 Encounter for immunization: Secondary | ICD-10-CM

## 2019-07-04 NOTE — Addendum Note (Signed)
Addended by: Eual Fines on: 07/04/2019 11:34 AM   Modules accepted: Orders

## 2019-07-04 NOTE — Assessment & Plan Note (Signed)
Hyperlipidemia:Low fat diet discussed and encouraged.   Lipid Panel  Lab Results  Component Value Date   CHOL 135 11/28/2018   HDL 59 11/28/2018   LDLCALC 62 11/28/2018   TRIG 68 11/28/2018   CHOLHDL 2.3 11/28/2018  Controlled, no change in medication Updated lab needed at/ before next visit.;

## 2019-07-04 NOTE — Assessment & Plan Note (Signed)
Controlled, no change in medication DASH diet and commitment to daily physical activity for a minimum of 30 minutes discussed and encouraged, as a part of hypertension management. The importance of attaining a healthy weight is also discussed.  BP/Weight 07/03/2019 03/06/2019 12/20/2018 12/03/2018 11/26/2018 09/17/2018 16/10/958  Systolic BP 454 098 119 - 147 - 829  Diastolic BP 76 70 83 - 80 - 82  Wt. (Lbs) 225 237 245 248 251.04 263 260  BMI 37.44 39.44 40.77 41.27 41.78 43.77 43.27

## 2019-07-04 NOTE — Assessment & Plan Note (Signed)
Managed by endo, well controlled,  Glenda Thompson is reminded of the importance of commitment to daily physical activity for 30 minutes or more, as able and the need to limit carbohydrate intake to 30 to 60 grams per meal to help with blood sugar control.   The need to take medication as prescribed, test blood sugar as directed, and to call between visits if there is a concern that blood sugar is uncontrolled is also discussed.   Glenda Thompson is reminded of the importance of daily foot exam, annual eye examination, and good blood sugar, blood pressure and cholesterol control.  Diabetic Labs Latest Ref Rng & Units 04/10/2019 03/06/2019 11/28/2018 08/01/2018 05/12/2018  HbA1c <5.7 % of total Hgb 6.3(H) - 8.6(H) 9.7(A) 11.7(H)  Microalbumin mg/dL 1.5 <3.0(H) - - -  Micro/Creat Ratio <30 mcg/mg creat 6 <13 - - -  Chol <200 mg/dL - - 135 235(A) -  HDL > OR = 50 mg/dL - - 59 63 -  Calc LDL mg/dL (calc) - - 62 138 -  Triglycerides <150 mg/dL - - 68 196(A) -  Creatinine 0.50 - 1.10 mg/dL 1.09 - 0.85 0.9 0.95   BP/Weight 07/03/2019 03/06/2019 12/20/2018 12/03/2018 11/26/2018 09/17/2018 82/04/785  Systolic BP 754 492 010 - 071 - 219  Diastolic BP 76 70 83 - 80 - 82  Wt. (Lbs) 225 237 245 248 251.04 263 260  BMI 37.44 39.44 40.77 41.27 41.78 43.77 43.27   Foot/eye exam completion dates Latest Ref Rng & Units 04/11/2019 11/26/2018  Eye Exam No Retinopathy No Retinopathy -  Foot Form Completion - - Done

## 2019-07-04 NOTE — Assessment & Plan Note (Signed)
Obesity linked with hTN and diabetes  Patient re-educated about  the importance of commitment to a  minimum of 150 minutes of exercise per week as able.  The importance of healthy food choices with portion control discussed, as well as eating regularly and within a 12 hour window most days. The need to choose "clean , green" food 50 to 75% of the time is discussed, as well as to make water the primary drink and set a goal of 64 ounces water daily.    Weight /BMI 07/03/2019 03/06/2019 12/20/2018  WEIGHT 225 lb 237 lb 245 lb  HEIGHT 5\' 5"  5\' 5"  5\' 5"   BMI 37.44 kg/m2 39.44 kg/m2 40.77 kg/m2

## 2019-07-26 ENCOUNTER — Other Ambulatory Visit: Payer: Self-pay | Admitting: Family Medicine

## 2019-08-05 ENCOUNTER — Other Ambulatory Visit: Payer: Self-pay | Admitting: Family Medicine

## 2019-08-05 ENCOUNTER — Other Ambulatory Visit: Payer: Self-pay

## 2019-08-05 ENCOUNTER — Encounter: Payer: Self-pay | Admitting: Family Medicine

## 2019-08-05 MED ORDER — ROSUVASTATIN CALCIUM 5 MG PO TABS: 5.0000 mg | ORAL_TABLET | Freq: Every day | ORAL | 1 refills | Status: DC

## 2019-10-02 ENCOUNTER — Other Ambulatory Visit: Payer: Self-pay | Admitting: "Endocrinology

## 2019-10-17 ENCOUNTER — Ambulatory Visit: Payer: Self-pay | Admitting: "Endocrinology

## 2019-11-19 ENCOUNTER — Other Ambulatory Visit: Payer: Self-pay | Admitting: Family Medicine

## 2019-12-18 ENCOUNTER — Other Ambulatory Visit: Payer: Self-pay | Admitting: Family Medicine

## 2019-12-25 ENCOUNTER — Other Ambulatory Visit: Payer: Self-pay | Admitting: "Endocrinology

## 2019-12-25 ENCOUNTER — Ambulatory Visit: Payer: BC Managed Care – PPO | Attending: Internal Medicine

## 2019-12-25 ENCOUNTER — Other Ambulatory Visit: Payer: Self-pay

## 2019-12-25 DIAGNOSIS — Z20822 Contact with and (suspected) exposure to covid-19: Secondary | ICD-10-CM

## 2019-12-26 ENCOUNTER — Ambulatory Visit (INDEPENDENT_AMBULATORY_CARE_PROVIDER_SITE_OTHER): Payer: BC Managed Care – PPO | Admitting: Family Medicine

## 2019-12-26 ENCOUNTER — Encounter: Payer: Self-pay | Admitting: Family Medicine

## 2019-12-26 VITALS — BP 120/60 | Ht 65.0 in | Wt 225.0 lb

## 2019-12-26 DIAGNOSIS — I10 Essential (primary) hypertension: Secondary | ICD-10-CM

## 2019-12-26 DIAGNOSIS — E1159 Type 2 diabetes mellitus with other circulatory complications: Secondary | ICD-10-CM | POA: Diagnosis not present

## 2019-12-26 DIAGNOSIS — Z6837 Body mass index (BMI) 37.0-37.9, adult: Secondary | ICD-10-CM

## 2019-12-26 DIAGNOSIS — Z1231 Encounter for screening mammogram for malignant neoplasm of breast: Secondary | ICD-10-CM

## 2019-12-26 DIAGNOSIS — E782 Mixed hyperlipidemia: Secondary | ICD-10-CM

## 2019-12-26 LAB — NOVEL CORONAVIRUS, NAA: SARS-CoV-2, NAA: NOT DETECTED

## 2019-12-26 MED ORDER — LISINOPRIL-HYDROCHLOROTHIAZIDE 20-12.5 MG PO TABS: 2.0000 | ORAL_TABLET | Freq: Every day | ORAL | 1 refills | Status: DC

## 2019-12-26 MED ORDER — AMLODIPINE BESYLATE 10 MG PO TABS: ORAL_TABLET | ORAL | 1 refills | Status: DC

## 2019-12-26 MED ORDER — METFORMIN HCL ER 500 MG PO TB24: 500.0000 mg | ORAL_TABLET | Freq: Every day | ORAL | 1 refills | Status: DC

## 2019-12-26 MED ORDER — PHENTERMINE HCL 37.5 MG PO TABS: ORAL_TABLET | ORAL | 1 refills | Status: DC

## 2019-12-26 MED ORDER — SPIRONOLACTONE 50 MG PO TABS: 50.0000 mg | ORAL_TABLET | Freq: Every day | ORAL | 1 refills | Status: DC

## 2019-12-26 MED ORDER — ROSUVASTATIN CALCIUM 5 MG PO TABS: 5.0000 mg | ORAL_TABLET | Freq: Every day | ORAL | 1 refills | Status: DC

## 2019-12-26 NOTE — Patient Instructions (Signed)
Annual physical exam in office in 4 months, call if you need me sooner  Please schedule your June mammogram, order is placed  You need to get fasting labs before I can prescribe phentermine , so please get that early next week   Congrats on good blood sugar values reported and ongoing weight loss, keep it up  Think about what you will eat, plan ahead. Choose " clean, green, fresh or frozen" over canned, processed or packaged foods which are more sugary, salty and fatty. 70 to 75% of food eaten should be vegetables and fruit. Three meals at set times with snacks allowed between meals, but they must be fruit or vegetables. Aim to eat over a 12 hour period , example 7 am to 7 pm, and STOP after  your last meal of the day. Drink water,generally about 64 ounces per day, no other drink is as healthy. Fruit juice is best enjoyed in a healthy way, by EATING the fruit.  It is important that you exercise regularly at least 30 minutes 5 times a week. If you develop chest pain, have severe difficulty breathing, or feel very tired, stop exercising immediately and seek medical attention   Thanks for choosing [facility], we consider it a privelige to serve you.

## 2019-12-26 NOTE — Progress Notes (Signed)
Virtual Visit via Telephone Note  I connected with Glenda Thompson on 12/26/19 at  3:00 PM EST by telephone and verified that I am speaking with the correct person using two identifiers.  Location: Patient: home Provider: office    I discussed the limitations, risks, security and privacy concerns of performing an evaluation and management service by telephone and the availability of in person appointments. I also discussed with the patient that there may be a patient responsible charge related to this service. The patient expressed understanding and agreed to proceed.   History of Present Illness:   Denies recent fever or chills. Denies sinus pressure, nasal congestion, ear pain or sore throat. Denies chest congestion, productive cough or wheezing. Denies chest pains, palpitations and leg swelling Denies abdominal pain, nausea, vomiting,diarrhea or constipation.   Denies dysuria, frequency, hesitancy or incontinence. Denies joint pain, swelling and limitation in mobility. Denies headaches, seizures, numbness, or tingling. Denies depression, anxiety or insomnia. Denies skin break down or rash. Denies polyuria, polydipsia, blurred vision , or hypoglycemic episodes. Requests phentermine refill, has done extremely well on it, however needs labs    Observations/Objective: BP 120/60   Ht 5\' 5"  (1.651 m)   Wt 225 lb (102.1 kg)   BMI 37.44 kg/m  Good communication with no confusion and intact memory. Alert and oriented x 3 No signs of respiratory distress during speech    Assessment and Plan: Hypertension goal BP (blood pressure) < 130/80 Controlled, no change in medication DASH diet and commitment to daily physical activity for a minimum of 30 minutes discussed and encouraged, as a part of hypertension management. The importance of attaining a healthy weight is also discussed.  BP/Weight 12/26/2019 07/03/2019 03/06/2019 12/20/2018 12/03/2018 11/26/2018 74/25/9563  Systolic BP 875 643  329 518 - 841 -  Diastolic BP 60 76 70 83 - 80 -  Wt. (Lbs) 225 225 237 245 248 251.04 263  BMI 37.44 37.44 39.44 40.77 41.27 41.78 43.77       Mixed hyperlipidemia Hyperlipidemia:Low fat diet discussed and encouraged.   Lipid Panel  Lab Results  Component Value Date   CHOL 135 11/28/2018   HDL 59 11/28/2018   LDLCALC 62 11/28/2018   TRIG 68 11/28/2018   CHOLHDL 2.3 11/28/2018     Updated lab needed at/ before next visit.   Type 2 diabetes mellitus with vascular disease [facility]) Ms. Titsworth is reminded of the importance of commitment to daily physical activity for 30 minutes or more, as able and the need to limit carbohydrate intake to 30 to 60 grams per meal to help with blood sugar control.   The need to take medication as prescribed, test blood sugar as directed, and to call between visits if there is a concern that blood sugar is uncontrolled is also discussed.   Ms. Stillion is reminded of the importance of daily foot exam, annual eye examination, and good blood sugar, blood pressure and cholesterol control.  Diabetic Labs Latest Ref Rng & Units 04/10/2019 03/06/2019 11/28/2018 08/01/2018 05/12/2018  HbA1c <5.7 % of total Hgb 6.3(H) - 8.6(H) 9.7(A) 11.7(H)  Microalbumin mg/dL 1.5 <3.0(H) - - -  Micro/Creat Ratio <30 mcg/mg creat 6 <13 - - -  Chol <200 mg/dL - - 135 235(A) -  HDL > OR = 50 mg/dL - - 59 63 -  Calc LDL mg/dL (calc) - - 62 138 -  Triglycerides <150 mg/dL - - 68 196(A) -  Creatinine 0.50 - 1.10 mg/dL 1.09 - 0.85 0.9  0.95   BP/Weight 12/26/2019 07/03/2019 03/06/2019 12/20/2018 12/03/2018 11/26/2018 09/17/2018  Systolic BP 120 120 130 147 - 756 -  Diastolic BP 60 76 70 83 - 80 -  Wt. (Lbs) 225 225 237 245 248 251.04 263  BMI 37.44 37.44 39.44 40.77 41.27 41.78 43.77   Foot/eye exam completion dates Latest Ref Rng & Units 04/11/2019 11/26/2018  Eye Exam No Retinopathy No Retinopathy -  Foot Form Completion - - Done      Controlled, no change in medication Updated  lab needed at/ before next visit.   Morbid obesity OBESITY LINKED WITH DIABETS AND HYOERENSION Ms. Josey is reminded of the importance of commitment to daily physical activity for 30 minutes or more, as able and the need to limit carbohydrate intake to 30 to 60 grams per meal to help with blood sugar control.   The need to take medication as prescribed, test blood sugar as directed, and to call between visits if there is a concern that blood sugar is uncontrolled is also discussed.   Ms. Pongratz is reminded of the importance of daily foot exam, annual eye examination, and good blood sugar, blood pressure and cholesterol control.  Diabetic Labs Latest Ref Rng & Units 04/10/2019 03/06/2019 11/28/2018 08/01/2018 05/12/2018  HbA1c <5.7 % of total Hgb 6.3(H) - 8.6(H) 9.7(A) 11.7(H)  Microalbumin mg/dL 1.5 <3.0(H) - - -  Micro/Creat Ratio <30 mcg/mg creat 6 <13 - - -  Chol <200 mg/dL - - 433 295(J) -  HDL > OR = 50 mg/dL - - 59 63 -  Calc LDL mg/dL (calc) - - 62 884 -  Triglycerides <150 mg/dL - - 68 166(A) -  Creatinine 0.50 - 1.10 mg/dL 6.30 - 1.60 0.9 1.09   BP/Weight 12/26/2019 07/03/2019 03/06/2019 12/20/2018 12/03/2018 11/26/2018 09/17/2018  Systolic BP 120 120 130 147 - 323 -  Diastolic BP 60 76 70 83 - 80 -  Wt. (Lbs) 225 225 237 245 248 251.04 263  BMI 37.44 37.44 39.44 40.77 41.27 41.78 43.77   Foot/eye exam completion dates Latest Ref Rng & Units 04/11/2019 11/26/2018  Eye Exam No Retinopathy No Retinopathy -  Foot Form Completion - - Done      Updated lab needed , HOLDING ON P PHENTERMINE WITHOIUT TSH UPDATED     Follow Up Instructions:    I discussed the assessment and treatment plan with the patient. The patient was provided an opportunity to ask questions and all were answered. The patient agreed with the plan and demonstrated an understanding of the instructions.   The patient was advised to call back or seek an in-person evaluation if the symptoms worsen or if the condition  fails to improve as anticipated.  I provided 22 minutes of non-face-to-face time during this encounter.   Syliva Overman, MD '

## 2019-12-28 ENCOUNTER — Encounter: Payer: Self-pay | Admitting: Family Medicine

## 2019-12-28 NOTE — Assessment & Plan Note (Signed)
Glenda Thompson is reminded of the importance of commitment to daily physical activity for 30 minutes or more, as able and the need to limit carbohydrate intake to 30 to 60 grams per meal to help with blood sugar control.   The need to take medication as prescribed, test blood sugar as directed, and to call between visits if there is a concern that blood sugar is uncontrolled is also discussed.   Glenda Thompson is reminded of the importance of daily foot exam, annual eye examination, and good blood sugar, blood pressure and cholesterol control.  Diabetic Labs Latest Ref Rng & Units 04/10/2019 03/06/2019 11/28/2018 08/01/2018 05/12/2018  HbA1c <5.7 % of total Hgb 6.3(H) - 8.6(H) 9.7(A) 11.7(H)  Microalbumin mg/dL 1.5 <3.0(H) - - -  Micro/Creat Ratio <30 mcg/mg creat 6 <13 - - -  Chol <200 mg/dL - - 412 878(M) -  HDL > OR = 50 mg/dL - - 59 63 -  Calc LDL mg/dL (calc) - - 62 767 -  Triglycerides <150 mg/dL - - 68 209(O) -  Creatinine 0.50 - 1.10 mg/dL 7.09 - 6.28 0.9 3.66   BP/Weight 12/26/2019 07/03/2019 03/06/2019 12/20/2018 12/03/2018 11/26/2018 09/17/2018  Systolic BP 120 120 130 147 - 294 -  Diastolic BP 60 76 70 83 - 80 -  Wt. (Lbs) 225 225 237 245 248 251.04 263  BMI 37.44 37.44 39.44 40.77 41.27 41.78 43.77   Foot/eye exam completion dates Latest Ref Rng & Units 04/11/2019 11/26/2018  Eye Exam No Retinopathy No Retinopathy -  Foot Form Completion - - Done      Controlled, no change in medication Updated lab needed at/ before next visit.

## 2019-12-28 NOTE — Assessment & Plan Note (Signed)
OBESITY LINKED WITH DIABETS AND HYOERENSION Glenda Thompson is reminded of the importance of commitment to daily physical activity for 30 minutes or more, as able and the need to limit carbohydrate intake to 30 to 60 grams per meal to help with blood sugar control.   The need to take medication as prescribed, test blood sugar as directed, and to call between visits if there is a concern that blood sugar is uncontrolled is also discussed.   Glenda Thompson is reminded of the importance of daily foot exam, annual eye examination, and good blood sugar, blood pressure and cholesterol control.  Diabetic Labs Latest Ref Rng & Units 04/10/2019 03/06/2019 11/28/2018 08/01/2018 05/12/2018  HbA1c <5.7 % of total Hgb 6.3(H) - 8.6(H) 9.7(A) 11.7(H)  Microalbumin mg/dL 1.5 <3.0(H) - - -  Micro/Creat Ratio <30 mcg/mg creat 6 <13 - - -  Chol <200 mg/dL - - 462 703(J) -  HDL > OR = 50 mg/dL - - 59 63 -  Calc LDL mg/dL (calc) - - 62 009 -  Triglycerides <150 mg/dL - - 68 381(W) -  Creatinine 0.50 - 1.10 mg/dL 2.99 - 3.71 0.9 6.96   BP/Weight 12/26/2019 07/03/2019 03/06/2019 12/20/2018 12/03/2018 11/26/2018 09/17/2018  Systolic BP 120 120 130 147 - 789 -  Diastolic BP 60 76 70 83 - 80 -  Wt. (Lbs) 225 225 237 245 248 251.04 263  BMI 37.44 37.44 39.44 40.77 41.27 41.78 43.77   Foot/eye exam completion dates Latest Ref Rng & Units 04/11/2019 11/26/2018  Eye Exam No Retinopathy No Retinopathy -  Foot Form Completion - - Done      Updated lab needed , HOLDING ON P PHENTERMINE WITHOIUT TSH UPDATED

## 2019-12-28 NOTE — Assessment & Plan Note (Signed)
Controlled, no change in medication DASH diet and commitment to daily physical activity for a minimum of 30 minutes discussed and encouraged, as a part of hypertension management. The importance of attaining a healthy weight is also discussed.  BP/Weight 12/26/2019 07/03/2019 03/06/2019 12/20/2018 12/03/2018 11/26/2018 09/17/2018  Systolic BP 120 120 130 147 - 888 -  Diastolic BP 60 76 70 83 - 80 -  Wt. (Lbs) 225 225 237 245 248 251.04 263  BMI 37.44 37.44 39.44 40.77 41.27 41.78 43.77

## 2019-12-28 NOTE — Assessment & Plan Note (Signed)
Hyperlipidemia:Low fat diet discussed and encouraged.   Lipid Panel  Lab Results  Component Value Date   CHOL 135 11/28/2018   HDL 59 11/28/2018   LDLCALC 62 11/28/2018   TRIG 68 11/28/2018   CHOLHDL 2.3 11/28/2018     Updated lab needed at/ before next visit.

## 2020-01-01 ENCOUNTER — Ambulatory Visit: Payer: BC Managed Care – PPO | Admitting: Family Medicine

## 2020-01-13 ENCOUNTER — Other Ambulatory Visit: Payer: Self-pay | Admitting: *Deleted

## 2020-01-13 DIAGNOSIS — E782 Mixed hyperlipidemia: Secondary | ICD-10-CM | POA: Diagnosis not present

## 2020-01-13 DIAGNOSIS — I10 Essential (primary) hypertension: Secondary | ICD-10-CM

## 2020-01-13 DIAGNOSIS — E1159 Type 2 diabetes mellitus with other circulatory complications: Secondary | ICD-10-CM | POA: Diagnosis not present

## 2020-01-14 ENCOUNTER — Other Ambulatory Visit: Payer: Self-pay | Admitting: Family Medicine

## 2020-01-14 ENCOUNTER — Encounter: Payer: Self-pay | Admitting: Family Medicine

## 2020-01-14 ENCOUNTER — Telehealth: Payer: Self-pay | Admitting: *Deleted

## 2020-01-14 LAB — CBC
HCT: 35.3 % (ref 35.0–45.0)
Hemoglobin: 11.6 g/dL — ABNORMAL LOW (ref 11.7–15.5)
MCH: 27.9 pg (ref 27.0–33.0)
MCHC: 32.9 g/dL (ref 32.0–36.0)
MCV: 84.9 fL (ref 80.0–100.0)
MPV: 10.5 fL (ref 7.5–12.5)
Platelets: 252 10*3/uL (ref 140–400)
RBC: 4.16 10*6/uL (ref 3.80–5.10)
RDW: 12.3 % (ref 11.0–15.0)
WBC: 6.5 10*3/uL (ref 3.8–10.8)

## 2020-01-14 LAB — COMPLETE METABOLIC PANEL WITH GFR
AG Ratio: 1.7 (calc) (ref 1.0–2.5)
ALT: 14 U/L (ref 6–29)
AST: 16 U/L (ref 10–35)
Albumin: 4.1 g/dL (ref 3.6–5.1)
Alkaline phosphatase (APISO): 51 U/L (ref 31–125)
BUN: 18 mg/dL (ref 7–25)
CO2: 29 mmol/L (ref 20–32)
Calcium: 9.4 mg/dL (ref 8.6–10.2)
Chloride: 103 mmol/L (ref 98–110)
Creat: 0.91 mg/dL (ref 0.50–1.10)
GFR, Est African American: 86 mL/min/{1.73_m2} (ref >=60)
GFR, Est Non African American: 74 mL/min/{1.73_m2} (ref >=60)
Globulin: 2.4 g/dL (calc) (ref 1.9–3.7)
Glucose, Bld: 134 mg/dL — ABNORMAL HIGH (ref 65–99)
Potassium: 4.5 mmol/L (ref 3.5–5.3)
Sodium: 139 mmol/L (ref 135–146)
Total Bilirubin: 0.5 mg/dL (ref 0.2–1.2)
Total Protein: 6.5 g/dL (ref 6.1–8.1)

## 2020-01-14 LAB — LIPID PANEL
Cholesterol: 158 mg/dL (ref <200)
HDL: 65 mg/dL (ref >=50)
LDL Cholesterol (Calc): 78 mg/dL (calc)
Non-HDL Cholesterol (Calc): 93 mg/dL (calc) (ref <130)
Total CHOL/HDL Ratio: 2.4 (calc) (ref <5.0)
Triglycerides: 70 mg/dL (ref <150)

## 2020-01-14 LAB — HEMOGLOBIN A1C
Hgb A1c MFr Bld: 7.8 % of total Hgb — ABNORMAL HIGH (ref <5.7)
Mean Plasma Glucose: 177 (calc)
eAG (mmol/L): 9.8 (calc)

## 2020-01-14 MED ORDER — METFORMIN HCL ER 500 MG PO TB24: ORAL_TABLET | ORAL | 1 refills | Status: DC

## 2020-01-14 MED ORDER — PHENTERMINE HCL 37.5 MG PO TABS: ORAL_TABLET | ORAL | 3 refills | Status: DC

## 2020-01-14 NOTE — Progress Notes (Signed)
Pt notified with verbal understanding  °

## 2020-01-14 NOTE — Telephone Encounter (Signed)
Phentermine was cancelled at [facility] due to price pt couldn't pick up she wanted this sent to [facility]

## 2020-01-14 NOTE — Telephone Encounter (Signed)
Medication has been prescribed.

## 2020-01-18 ENCOUNTER — Other Ambulatory Visit: Payer: Self-pay | Admitting: "Endocrinology

## 2020-01-24 ENCOUNTER — Other Ambulatory Visit: Payer: Self-pay | Admitting: *Deleted

## 2020-01-24 ENCOUNTER — Telehealth: Payer: Self-pay

## 2020-01-24 NOTE — Telephone Encounter (Signed)
Please send RX for monitor -Freestyle LITE, send to [facility] in [place]   She accidentally tossed hers in the trash

## 2020-01-28 ENCOUNTER — Other Ambulatory Visit: Payer: Self-pay | Admitting: *Deleted

## 2020-01-28 MED ORDER — BLOOD GLUCOSE METER KIT: PACK | 0 refills | Status: DC

## 2020-01-28 NOTE — Telephone Encounter (Signed)
Meter and supplies sent to [facility]

## 2020-02-12 ENCOUNTER — Other Ambulatory Visit: Payer: Self-pay | Admitting: Family Medicine

## 2020-03-10 DIAGNOSIS — M722 Plantar fascial fibromatosis: Secondary | ICD-10-CM | POA: Diagnosis not present

## 2020-03-10 DIAGNOSIS — M5416 Radiculopathy, lumbar region: Secondary | ICD-10-CM | POA: Diagnosis not present

## 2020-03-13 ENCOUNTER — Other Ambulatory Visit: Payer: Self-pay | Admitting: Family Medicine

## 2020-03-25 ENCOUNTER — Other Ambulatory Visit: Payer: Self-pay | Admitting: Family Medicine

## 2020-03-25 ENCOUNTER — Telehealth: Payer: Self-pay

## 2020-03-25 DIAGNOSIS — E1159 Type 2 diabetes mellitus with other circulatory complications: Secondary | ICD-10-CM

## 2020-03-25 MED ORDER — GLIPIZIDE ER 5 MG PO TB24: 5.0000 mg | ORAL_TABLET | Freq: Every day | ORAL | 2 refills | Status: DC

## 2020-03-25 NOTE — Telephone Encounter (Signed)
Pt aware and labs ordered 

## 2020-03-25 NOTE — Telephone Encounter (Signed)
States since she got the covid shot her sugar has been running high fasting up to 196 and the highest its been is 214. Takes metformin 2 daily and wants to know if anything else needs to be added short term to help because she's scared to eat much of anything scared it will shoot up very high

## 2020-03-25 NOTE — Telephone Encounter (Signed)
GLIPIZIDE 5 MG SENT IN ONE DAILY WITH BREAKFAST, CONTINUE METFORMIN AS BEFORE NEEDS TO GETMICROALB, NON FAST HBA1C , CHEM 7 AND EGFR ON 6/23 OR AFTER AND KEEP July APPT

## 2020-04-06 ENCOUNTER — Ambulatory Visit (HOSPITAL_COMMUNITY): Payer: BC Managed Care – PPO

## 2020-04-20 DIAGNOSIS — M79671 Pain in right foot: Secondary | ICD-10-CM | POA: Diagnosis not present

## 2020-04-20 DIAGNOSIS — M722 Plantar fascial fibromatosis: Secondary | ICD-10-CM | POA: Diagnosis not present

## 2020-04-28 ENCOUNTER — Encounter: Payer: BC Managed Care – PPO | Admitting: Family Medicine

## 2020-04-28 ENCOUNTER — Encounter: Payer: Self-pay | Admitting: Family Medicine

## 2020-04-29 ENCOUNTER — Other Ambulatory Visit: Payer: Self-pay | Admitting: Family Medicine

## 2020-05-05 ENCOUNTER — Other Ambulatory Visit: Payer: Self-pay | Admitting: Family Medicine

## 2020-05-18 ENCOUNTER — Ambulatory Visit (HOSPITAL_COMMUNITY)
Admission: RE | Admit: 2020-05-18 | Discharge: 2020-05-18 | Disposition: A | Discharge status: Home / Self Care | Payer: BC Managed Care – PPO | Source: Ambulatory Visit | Attending: Family Medicine | Admitting: Family Medicine

## 2020-05-18 ENCOUNTER — Other Ambulatory Visit: Payer: Self-pay

## 2020-05-18 DIAGNOSIS — M79671 Pain in right foot: Secondary | ICD-10-CM | POA: Diagnosis not present

## 2020-05-18 DIAGNOSIS — M722 Plantar fascial fibromatosis: Secondary | ICD-10-CM | POA: Diagnosis not present

## 2020-05-18 DIAGNOSIS — Z1231 Encounter for screening mammogram for malignant neoplasm of breast: Secondary | ICD-10-CM

## 2020-05-25 ENCOUNTER — Ambulatory Visit (HOSPITAL_COMMUNITY)
Admission: RE | Admit: 2020-05-25 | Discharge: 2020-05-25 | Disposition: A | Discharge status: Home / Self Care | Payer: BC Managed Care – PPO | Source: Ambulatory Visit | Attending: Family Medicine | Admitting: Family Medicine

## 2020-05-25 ENCOUNTER — Other Ambulatory Visit: Payer: Self-pay

## 2020-05-25 DIAGNOSIS — Z1231 Encounter for screening mammogram for malignant neoplasm of breast: Secondary | ICD-10-CM | POA: Insufficient documentation

## 2020-06-09 ENCOUNTER — Encounter: Payer: Self-pay | Admitting: Family Medicine

## 2020-06-15 ENCOUNTER — Telehealth: Payer: Self-pay

## 2020-06-15 ENCOUNTER — Encounter: Payer: Self-pay | Admitting: Family Medicine

## 2020-06-15 DIAGNOSIS — M722 Plantar fascial fibromatosis: Secondary | ICD-10-CM | POA: Diagnosis not present

## 2020-06-15 DIAGNOSIS — E1159 Type 2 diabetes mellitus with other circulatory complications: Secondary | ICD-10-CM | POA: Diagnosis not present

## 2020-06-15 DIAGNOSIS — E782 Mixed hyperlipidemia: Secondary | ICD-10-CM | POA: Diagnosis not present

## 2020-06-15 DIAGNOSIS — M79671 Pain in right foot: Secondary | ICD-10-CM | POA: Diagnosis not present

## 2020-06-15 DIAGNOSIS — E559 Vitamin D deficiency, unspecified: Secondary | ICD-10-CM | POA: Diagnosis not present

## 2020-06-15 DIAGNOSIS — Z1159 Encounter for screening for other viral diseases: Secondary | ICD-10-CM | POA: Diagnosis not present

## 2020-06-15 DIAGNOSIS — R7989 Other specified abnormal findings of blood chemistry: Secondary | ICD-10-CM | POA: Diagnosis not present

## 2020-06-15 NOTE — Telephone Encounter (Signed)
FMLA reiceived in office Noted Copied Sleeved

## 2020-06-16 ENCOUNTER — Other Ambulatory Visit: Payer: Self-pay | Admitting: Family Medicine

## 2020-06-17 ENCOUNTER — Encounter: Payer: Self-pay | Admitting: Family Medicine

## 2020-06-17 NOTE — Telephone Encounter (Signed)
LVM for the patient to call me back, we need more information  Regarding the FMLA and the reason for the [facility]

## 2020-06-17 NOTE — Telephone Encounter (Signed)
Pt called back to state that this is for Dr visits, and when her sugar goes up or down  Example   Up to 3 times a month possible.

## 2020-06-18 NOTE — Telephone Encounter (Signed)
Fax [phone] for FMLA

## 2020-06-19 ENCOUNTER — Other Ambulatory Visit: Payer: Self-pay | Admitting: Family Medicine

## 2020-06-22 ENCOUNTER — Encounter: Payer: Self-pay | Admitting: Family Medicine

## 2020-06-22 ENCOUNTER — Other Ambulatory Visit: Payer: Self-pay

## 2020-06-22 ENCOUNTER — Ambulatory Visit (INDEPENDENT_AMBULATORY_CARE_PROVIDER_SITE_OTHER): Payer: BC Managed Care – PPO | Admitting: Family Medicine

## 2020-06-22 VITALS — BP 134/81 | HR 85 | Resp 16 | Ht 65.0 in | Wt 235.1 lb

## 2020-06-22 DIAGNOSIS — Z23 Encounter for immunization: Secondary | ICD-10-CM

## 2020-06-22 DIAGNOSIS — Z0289 Encounter for other administrative examinations: Secondary | ICD-10-CM

## 2020-06-22 DIAGNOSIS — E1159 Type 2 diabetes mellitus with other circulatory complications: Secondary | ICD-10-CM

## 2020-06-22 DIAGNOSIS — E559 Vitamin D deficiency, unspecified: Secondary | ICD-10-CM

## 2020-06-22 DIAGNOSIS — I10 Essential (primary) hypertension: Secondary | ICD-10-CM

## 2020-06-22 DIAGNOSIS — E782 Mixed hyperlipidemia: Secondary | ICD-10-CM

## 2020-06-22 MED ORDER — TERBINAFINE HCL 250 MG PO TABS: 250.0000 mg | ORAL_TABLET | Freq: Every day | ORAL | 1 refills | Status: DC

## 2020-06-22 NOTE — Assessment & Plan Note (Signed)
Controlled, no change in medication DASH diet and commitment to daily physical activity for a minimum of 30 minutes discussed and encouraged, as a part of hypertension management. The importance of attaining a healthy weight is also discussed.  BP/Weight 06/22/2020 12/26/2019 07/03/2019 03/06/2019 12/20/2018 12/03/2018 11/26/2018  Systolic BP 134 120 120 130 147 - 162  Diastolic BP 81 60 76 70 83 - 80  Wt. (Lbs) 235.12 225 225 237 245 248 251.04  BMI 39.13 37.44 37.44 39.44 40.77 41.27 41.78

## 2020-06-22 NOTE — Assessment & Plan Note (Signed)
Controlled, no change in medication Glenda Thompson is reminded of the importance of commitment to daily physical activity for 30 minutes or more, as able and the need to limit carbohydrate intake to 30 to 60 grams per meal to help with blood sugar control.   The need to take medication as prescribed, test blood sugar as directed, and to call between visits if there is a concern that blood sugar is uncontrolled is also discussed.   Glenda Thompson is reminded of the importance of daily foot exam, annual eye examination, and good blood sugar, blood pressure and cholesterol control.  Diabetic Labs Latest Ref Rng & Units 06/15/2020 01/13/2020 04/10/2019 03/06/2019 11/28/2018  HbA1c <5.7 % of total Hgb 7.4(H) 7.8(H) 6.3(H) - 8.6(H)  Microalbumin mg/dL 0.2 - 1.5 <3.0(H) -  Micro/Creat Ratio <30 mcg/mg creat 2 - 6 <13 -  Chol <200 mg/dL - 128 - - 786  HDL > OR = 50 mg/dL - 65 - - 59  Calc LDL mg/dL (calc) - 78 - - 62  Triglycerides <150 mg/dL - 70 - - 68  Creatinine 0.50 - 1.10 mg/dL 7.67(M) 0.94 7.09 - 6.28   BP/Weight 06/22/2020 12/26/2019 07/03/2019 03/06/2019 12/20/2018 12/03/2018 11/26/2018  Systolic BP 134 120 120 130 147 - 162  Diastolic BP 81 60 76 70 83 - 80  Wt. (Lbs) 235.12 225 225 237 245 248 251.04  BMI 39.13 37.44 37.44 39.44 40.77 41.27 41.78   Foot/eye exam completion dates Latest Ref Rng & Units 04/11/2019 11/26/2018  Eye Exam No Retinopathy No Retinopathy -  Foot Form Completion - - Done

## 2020-06-22 NOTE — Patient Instructions (Addendum)
Annual physical exam early November, call if you need me before   Flu vaccine today  Please call and make eye appointment     Good foot exam except reduced sensation right heel, check feet daily ' New for 12 weeks for fungal infection is terbinafine  FMLA will allow up to once per month visits , due to current foot problems, generally every 3 months in office is expected  We will add hepatitis C scree, lipid and hepatic panel, vit D and TSH and iron to recent lab and contact you with result  Think about what you will eat, plan ahead. Choose " clean, green, fresh or frozen" over canned, processed or packaged foods which are more sugary, salty and fatty. 70 to 75% of food eaten should be vegetables and fruit. Three meals at set times with snacks allowed between meals, but they must be fruit or vegetables. Aim to eat over a 12 hour period , example 7 am to 7 pm, and STOP after  your last meal of the day. Drink water,generally about 64 ounces per day, no other drink is as healthy. Fruit juice is best enjoyed in a healthy way, by EATING the fruit.    Exercises To Do While Sitting  Exercises that you do while sitting (chair exercises) can give you many of the same benefits as full exercise. Benefits include strengthening your heart, burning calories, and keeping muscles and joints healthy. Exercise can also improve your mood and help with depression and anxiety. You may benefit from chair exercises if you are unable to do standing exercises because of:  Diabetic foot pain.  Obesity.  Illness.  Arthritis.  Recovery from surgery or injury.  Breathing problems.  Balance problems.  Another type of disability. Before starting chair exercises, check with your health care provider or a physical therapist to find out how much exercise you can tolerate and which exercises are safe for you. If your health care provider approves:  Start out slowly and build up over time. Aim to work up  to about 10-20 minutes for each exercise session.  Make exercise part of your daily routine.  Drink water when you exercise. Do not wait until you are thirsty. Drink every 10-15 minutes.  Stop exercising right away if you have pain, nausea, shortness of breath, or dizziness.  If you are exercising in a wheelchair, make sure to lock the wheels.  Ask your health care provider whether you can do tai chi or yoga. Many positions in these mind-body exercises can be modified to do while seated. Warm-up Before starting other exercises: 1. Sit up as straight as you can. Have your knees bent at 90 degrees, which is the shape of the capital letter "L." Keep your feet flat on the floor. 2. Sit at the front edge of your chair, if you can. 3. Pull in (tighten) the muscles in your abdomen and stretch your spine and neck as straight as you can. Hold this position for a few minutes. 4. Breathe in and out evenly. Try to concentrate on your breathing, and relax your mind. Stretching Exercise A: Arm stretch 1. Hold your arms out straight in front of your body. 2. Bend your hands at the wrist with your fingers pointing up, as if signaling someone to stop. Notice the slight tension in your forearms as you hold the position. 3. Keeping your arms out and your hands bent, rotate your hands outward as far as you can and hold this stretch. Aim  to have your thumbs pointing up and your pinkie fingers pointing down. Slowly repeat arm stretches for one minute as tolerated. Exercise B: Leg stretch 1. If you can move your legs, try to "draw" letters on the floor with the toes of your foot. Write your name with one foot. 2. Write your name with the toes of your other foot. Slowly repeat the movements for one minute as tolerated. Exercise C: Reach for the sky 1. Reach your hands as far over your head as you can to stretch your spine. 2. Move your hands and arms as if you are climbing a rope. Slowly repeat the movements  for one minute as tolerated. Range of motion exercises Exercise A: Shoulder roll 1. Let your arms hang loosely at your sides. 2. Lift just your shoulders up toward your ears, then let them relax back down. 3. When your shoulders feel loose, rotate your shoulders in backward and forward circles. Do shoulder rolls slowly for one minute as tolerated. Exercise B: March in place 1. As if you are marching, pump your arms and lift your legs up and down. Lift your knees as high as you can. ? If you are unable to lift your knees, just pump your arms and move your ankles and feet up and down. March in place for one minute as tolerated. Exercise C: Seated jumping jacks 1. Let your arms hang down straight. 2. Keeping your arms straight, lift them up over your head. Aim to point your fingers to the ceiling. 3. While you lift your arms, straighten your legs and slide your heels along the floor to your sides, as wide as you can. 4. As you bring your arms back down to your sides, slide your legs back together. ? If you are unable to use your legs, just move your arms. Slowly repeat seated jumping jacks for one minute as tolerated. Strengthening exercises Exercise A: Shoulder squeeze 1. Hold your arms straight out from your body to your sides, with your elbows bent and your fists pointed at the ceiling. 2. Keeping your arms in the bent position, move them forward so your elbows and forearms meet in front of your face. 3. Open your arms back out as wide as you can with your elbows still bent, until you feel your shoulder blades squeezing together. Hold for 5 seconds. Slowly repeat the movements forward and backward for one minute as tolerated. Contact a health care provider if you:  Had to stop exercising due to any of the following: ? Pain. ? Nausea. ? Shortness of breath. ? Dizziness. ? Fatigue.  Have significant pain or soreness after exercising. Get help right away if you have:  Chest  pain.  Difficulty breathing. These symptoms may represent a serious problem that is an emergency. Do not wait to see if the symptoms will go away. Get medical help right away. Call your local emergency services (911 in the [place] Do not drive yourself to the hospital. This information is not intended to replace advice given to you by your health care provider. Make sure you discuss any questions you have with your health care provider. Document Revised: 01/31/2019 Document Reviewed: 08/23/2017 Elsevier Patient Education  2020 [employer].

## 2020-06-23 ENCOUNTER — Encounter: Payer: Self-pay | Admitting: Family Medicine

## 2020-06-23 LAB — BASIC METABOLIC PANEL WITH GFR
BUN/Creatinine Ratio: 25 (calc) — ABNORMAL HIGH (ref 6–22)
BUN: 30 mg/dL — ABNORMAL HIGH (ref 7–25)
CO2: 30 mmol/L (ref 20–32)
Calcium: 9.8 mg/dL (ref 8.6–10.2)
Chloride: 101 mmol/L (ref 98–110)
Creat: 1.18 mg/dL — ABNORMAL HIGH (ref 0.50–1.10)
GFR, Est African American: 63 mL/min/{1.73_m2} (ref >=60)
GFR, Est Non African American: 54 mL/min/{1.73_m2} — ABNORMAL LOW (ref >=60)
Glucose, Bld: 139 mg/dL — ABNORMAL HIGH (ref 65–99)
Potassium: 4.6 mmol/L (ref 3.5–5.3)
Sodium: 136 mmol/L (ref 135–146)

## 2020-06-23 LAB — LIPID PANEL
Cholesterol: 166 mg/dL (ref <200)
HDL: 87 mg/dL (ref >=50)
LDL Cholesterol (Calc): 63 mg/dL (calc)
Non-HDL Cholesterol (Calc): 79 mg/dL (calc) (ref <130)
Total CHOL/HDL Ratio: 1.9 (calc) (ref <5.0)
Triglycerides: 77 mg/dL (ref <150)

## 2020-06-23 LAB — HEPATITIS C ANTIBODY
Hepatitis C Ab: NONREACTIVE
SIGNAL TO CUT-OFF: 0.01 (ref <1.00)

## 2020-06-23 LAB — TEST AUTHORIZATION

## 2020-06-23 LAB — MICROALBUMIN / CREATININE URINE RATIO
Creatinine, Urine: 100 mg/dL (ref 20–275)
Microalb Creat Ratio: 2 mcg/mg creat (ref <30)
Microalb, Ur: 0.2 mg/dL

## 2020-06-23 LAB — ADD ON HEP FUNCT PNL
AG Ratio: 1.7 (calc) (ref 1.0–2.5)
ALT: 12 U/L (ref 6–29)
AST: 10 U/L (ref 10–35)
Albumin: 4.3 g/dL (ref 3.6–5.1)
Alkaline phosphatase (APISO): 47 U/L (ref 31–125)
Bilirubin, Direct: 0.1 mg/dL (ref 0.0–0.2)
Globulin: 2.6 g/dL (calc) (ref 1.9–3.7)
Indirect Bilirubin: 0.1 mg/dL (calc) — ABNORMAL LOW (ref 0.2–1.2)
Total Bilirubin: 0.2 mg/dL (ref 0.2–1.2)
Total Protein: 6.9 g/dL (ref 6.1–8.1)

## 2020-06-23 LAB — VITAMIN D 25 HYDROXY (VIT D DEFICIENCY, FRACTURES): Vit D, 25-Hydroxy: 20 ng/mL — ABNORMAL LOW (ref 30–100)

## 2020-06-23 LAB — HEMOGLOBIN A1C
Hgb A1c MFr Bld: 7.4 % of total Hgb — ABNORMAL HIGH (ref <5.7)
Mean Plasma Glucose: 166 (calc)
eAG (mmol/L): 9.2 (calc)

## 2020-06-23 LAB — TEST AUTHORIZATION 2

## 2020-06-23 LAB — TSH: TSH: 2.72 mIU/L

## 2020-06-23 NOTE — Assessment & Plan Note (Signed)
Not at goal, start oTC vit D 3, 2000 iU daily

## 2020-06-23 NOTE — Assessment & Plan Note (Signed)
Hyperlipidemia:Low fat diet discussed and encouraged.   Lipid Panel  Lab Results  Component Value Date   CHOL 166 06/15/2020   HDL 87 06/15/2020   LDLCALC 63 06/15/2020   TRIG 77 06/15/2020   CHOLHDL 1.9 06/15/2020    Controlled, no change in medication

## 2020-06-23 NOTE — Assessment & Plan Note (Addendum)
UControlled, no change in medication, not a tgoal however DASH diet and commitment to daily physical activity for a minimum of 30 minutes discussed and encouraged, as a part of hypertension management. The importance of attaining a healthy weight is also discussed.  BP/Weight 06/22/2020 12/26/2019 07/03/2019 03/06/2019 12/20/2018 12/03/2018 11/26/2018  Systolic BP 134 120 120 130 147 - 162  Diastolic BP 81 60 76 70 83 - 80  Wt. (Lbs) 235.12 225 225 237 245 248 251.04  BMI 39.13 37.44 37.44 39.44 40.77 41.27 41.78

## 2020-06-23 NOTE — Assessment & Plan Note (Signed)
Reviewed FMLA form with patient and  needed absences for health purposes are discussed and pt aform will be completes as discussed

## 2020-06-23 NOTE — Progress Notes (Signed)
Glenda Thompson     MRN: [id number]      DOB: Apr 10, 1971   HPI Glenda Thompson is here for follow up and re-evaluation of chronic medical conditions, medication management and review of any available recent lab and radiology data.  Preventive health is updated, specifically  Cancer screening and Immunization.   Questions or concerns regarding consultations or procedures which the PT has had in the interim are  Addressed. Has been having severe right foot pain needing to see podiatry and getting steroid injection on more than one occasion Blood sugar control has been less than optimal as a result The PT denies any adverse reactions to current medications since the last visit.  Needs fMLA for completed for work   ROS Denies recent fever or chills. Denies sinus pressure, nasal congestion, ear pain or sore throat. Denies chest congestion, productive cough or wheezing. Denies chest pains, palpitations and leg swelling Denies abdominal pain, nausea, vomiting,diarrhea or constipation.   Denies dysuria, frequency, hesitancy or incontinence. . Denies headaches, seizures, numbness, or tingling. Denies depression, anxiety or insomnia. Denies skin break down or rash.   PE  BP 134/81   Pulse 85   Resp 16   Ht 5\' 5"  (1.651 m)   Wt 235 lb 1.9 oz (106.6 kg)   SpO2 97%   BMI 39.13 kg/m   Patient alert and oriented and in no cardiopulmonary distress.  HEENT: No facial asymmetry, EOMI,     Neck supple .  Chest: Clear to auscultation bilaterally.  CVS: S1, S2 no murmurs, no S3.Regular rate.  ABD: Soft non tender.   Ext: No edema  MS: Adequate ROM spine, shoulders, hips and knees.  Skin: Intact, no ulcerations or rash noted.  Psych: Good eye contact, normal affect. Memory intact not anxious or depressed appearing.  CNS: CN 2-12 intact, power,  normal throughout.no focal deficits noted.   Assessment & Plan  Essential hypertension, benign Controlled, no change in medication DASH  diet and commitment to daily physical activity for a minimum of 30 minutes discussed and encouraged, as a part of hypertension management. The importance of attaining a healthy weight is also discussed.  BP/Weight 06/22/2020 12/26/2019 07/03/2019 03/06/2019 12/20/2018 12/03/2018 11/26/2018  Systolic BP 134 120 120 130 147 - 162  Diastolic BP 81 60 76 70 83 - 80  Wt. (Lbs) 235.12 225 225 237 245 248 251.04  BMI 39.13 37.44 37.44 39.44 40.77 41.27 41.78       Type 2 diabetes mellitus with vascular disease (HCC) Controlled, no change in medication Glenda Thompson is reminded of the importance of commitment to daily physical activity for 30 minutes or more, as able and the need to limit carbohydrate intake to 30 to 60 grams per meal to help with blood sugar control.   The need to take medication as prescribed, test blood sugar as directed, and to call between visits if there is a concern that blood sugar is uncontrolled is also discussed.   Glenda Thompson is reminded of the importance of daily foot exam, annual eye examination, and good blood sugar, blood pressure and cholesterol control.  Diabetic Labs Latest Ref Rng & Units 06/15/2020 01/13/2020 04/10/2019 03/06/2019 11/28/2018  HbA1c <5.7 % of total Hgb 7.4(H) 7.8(H) 6.3(H) - 8.6(H)  Microalbumin mg/dL 0.2 - 1.5 <3.0(H) -  Micro/Creat Ratio <30 mcg/mg creat 2 - 6 <13 -  Chol <200 mg/dL - 01/27/2019 - - 416  HDL > OR = 50 mg/dL - 65 - - 59  Calc LDL mg/dL (calc) - 78 - - 62  Triglycerides <150 mg/dL - 70 - - 68  Creatinine 0.50 - 1.10 mg/dL 0.93(A) 3.55 7.32 - 2.02   BP/Weight 06/22/2020 12/26/2019 07/03/2019 03/06/2019 12/20/2018 12/03/2018 11/26/2018  Systolic BP 134 120 120 130 147 - 162  Diastolic BP 81 60 76 70 83 - 80  Wt. (Lbs) 235.12 225 225 237 245 248 251.04  BMI 39.13 37.44 37.44 39.44 40.77 41.27 41.78   Foot/eye exam completion dates Latest Ref Rng & Units 04/11/2019 11/26/2018  Eye Exam No Retinopathy No Retinopathy -  Foot Form Completion - - Done         Hypertension goal BP (blood pressure) < 130/80 UControlled, no change in medication, not a tgoal however DASH diet and commitment to daily physical activity for a minimum of 30 minutes discussed and encouraged, as a part of hypertension management. The importance of attaining a healthy weight is also discussed.  BP/Weight 06/22/2020 12/26/2019 07/03/2019 03/06/2019 12/20/2018 12/03/2018 11/26/2018  Systolic BP 134 120 120 130 147 - 162  Diastolic BP 81 60 76 70 83 - 80  Wt. (Lbs) 235.12 225 225 237 245 248 251.04  BMI 39.13 37.44 37.44 39.44 40.77 41.27 41.78       Mixed hyperlipidemia Hyperlipidemia:Low fat diet discussed and encouraged.   Lipid Panel  Lab Results  Component Value Date   CHOL 166 06/15/2020   HDL 87 06/15/2020   LDLCALC 63 06/15/2020   TRIG 77 06/15/2020   CHOLHDL 1.9 06/15/2020    Controlled, no change in medication    Vitamin D deficiency Not at goal, start oTC vit D 3, 2000 iU daily  Encounter for completion of form with patient Reviewed FMLA form with patient and  needed absences for health purposes are discussed and pt aform will be completes as discussed

## 2020-06-24 DIAGNOSIS — E1159 Type 2 diabetes mellitus with other circulatory complications: Secondary | ICD-10-CM

## 2020-06-24 DIAGNOSIS — E782 Mixed hyperlipidemia: Secondary | ICD-10-CM

## 2020-06-25 NOTE — Telephone Encounter (Signed)
Completed  Copied  Noted LVM for the pt that the forms are complete and they are up front No fax #

## 2020-07-07 ENCOUNTER — Other Ambulatory Visit: Payer: Self-pay | Admitting: Family Medicine

## 2020-07-12 ENCOUNTER — Other Ambulatory Visit: Payer: Self-pay | Admitting: Family Medicine

## 2020-08-28 ENCOUNTER — Other Ambulatory Visit: Payer: Self-pay

## 2020-08-28 ENCOUNTER — Encounter: Payer: Self-pay | Admitting: Family Medicine

## 2020-08-28 ENCOUNTER — Telehealth (INDEPENDENT_AMBULATORY_CARE_PROVIDER_SITE_OTHER): Payer: BC Managed Care – PPO | Admitting: Family Medicine

## 2020-08-28 VITALS — Ht 65.0 in | Wt 238.0 lb

## 2020-08-28 DIAGNOSIS — M792 Neuralgia and neuritis, unspecified: Secondary | ICD-10-CM

## 2020-08-28 DIAGNOSIS — M25511 Pain in right shoulder: Secondary | ICD-10-CM | POA: Diagnosis not present

## 2020-08-28 MED ORDER — GABAPENTIN 100 MG PO CAPS: 100.0000 mg | ORAL_CAPSULE | Freq: Two times a day (BID) | ORAL | 1 refills | Status: DC

## 2020-08-28 NOTE — Patient Instructions (Addendum)
  HAPPY FALL!  I appreciate the opportunity to provide you with care for your health and wellness. Today we discussed: right arm, hand, and shoulder pain  Follow up: as scheduled  No labs  Referrals today: Ortho for questionable carpal tunnel, and or bursitis- overuse injury or rotator cuff issue  Take gabapentin as prescribed for now. Take ibuprofen 400-800 mg daily for shoulder pain if needed. Out of work until Monday- note provided  Please continue to practice social distancing to keep you, your family, and our community safe.  If you must go out, please wear a mask and practice good handwashing.  It was a pleasure to see you and I look forward to continuing to work together on your health and well-being. Please do not hesitate to call the office if you need care or have questions about your care.  Have a wonderful day and week. With Gratitude, Tereasa Coop, DNP, AGNP-BC

## 2020-08-28 NOTE — Assessment & Plan Note (Signed)
Description sounds like carpal tunnel vs repetitive /overuse of wrist. However she also has shoulder pain, so questionable neck, impingement or bursitis/ rotator issue Ortho referral made Brace encouraged, gabapentin ordered, NSAID if needed. NSAID precautions were reviewed with the patient.  Patient should take medication with food, discontinue if develops GI side effects, not to take other OTC NSAIDs at the same time, and not to use longer than recommended. OOW until Monday- note provided Patient acknowledged agreement and understanding of the plan.

## 2020-08-28 NOTE — Progress Notes (Signed)
Virtual Visit via Telephone Note   This visit type was conducted due to national recommendations for restrictions regarding the COVID-19 Pandemic (e.g. social distancing) in an effort to limit this patient's exposure and mitigate transmission in our community.  Due to her co-morbid illnesses, this patient is at least at moderate risk for complications without adequate follow up.  This format is felt to be most appropriate for this patient at this time.  The patient did not have access to video technology/had technical difficulties with video requiring transitioning to audio format only (telephone).  All issues noted in this document were discussed and addressed.  No physical exam could be performed with this format.  Evaluation Performed:  Follow-up visit  Date:  08/28/2020   ID:  Glenda Thompson, DOB 1971-10-21, MRN [id number]  Patient Location: Home Provider Location: Other:  offsite  Location of Patient: Home Location of Provider: Telehealth Consent was obtain for visit to be over via telehealth. I verified that I am speaking with the correct person using two identifiers.  PCP:  Fayrene Helper, MD   Chief Complaint:  Right hand, arm, and shoulder pain  History of Present Illness:    Glenda Thompson is a 49 y.o. female with history as detailed below. She presents today for an acute visit with right arm pains.  Onset was about Monday with an aching.  No changes on activity level. 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 She does wear carpal tunnel gloves but they are not helping. Right hand dominant.  Did happen last year, took asa with relief, but this year it is not working.  Starts in shoulder, goes down back of arm to elbow and then down to fingers. Unable to lift Right arm that much to do her hair.  She reports night time pain is worse. Numbness in fingers. Denies any known injury  or trauma to area.  No history of bursitis or carpal tunnel. Questionable overuse and repetitive use.  The patient does not have symptoms concerning for COVID-19 infection (fever, chills, cough, or new shortness of breath).   Past Medical, Surgical, Social History, Allergies, and Medications have been Reviewed.  Past Medical History:  Diagnosis Date  . Diabetes mellitus   . Hypertension    Past Surgical History:  Procedure Laterality Date  . CESAREAN SECTION       Current Meds  Medication Sig  . amLODipine (NORVASC) 10 MG tablet TAKE 1 TABLET(10 MG) BY MOUTH DAILY  . blood glucose meter kit and supplies Three times daily dx E11.65 Freestyle Lite  . fluticasone (FLONASE) 50 MCG/ACT nasal spray Place 2 sprays into both nostrils daily.  Marland Kitchen glipiZIDE (GLUCOTROL XL) 5 MG 24 hr tablet TAKE 1 TABLET BY MOUTH EVERY DAY WITH BREAKFAST  . glucose blood (FREESTYLE LITE) test strip USE TO TEST THREE TIMES DAILY  . Insulin Pen Needle (B-D ULTRAFINE III SHORT PEN) 31G X 8 MM MISC Use as directed to inject insulin daily.  . Lancets (FREESTYLE) lancets USE TO OBTAIN A BLOOD SPECIMEN TO TEST BLOOD SUGAR TWICE A DAY  . lisinopril-hydrochlorothiazide (ZESTORETIC) 20-12.5 MG tablet TAKE 2 TABLETS BY MOUTH DAILY  . meloxicam (MOBIC) 15 MG tablet Take 15 mg by mouth daily.  . metFORMIN (GLUCOPHAGE-XR) 500 MG 24 hr tablet TAKE 1 TABLET BY MOUTH TWICE A DAY  . phentermine (ADIPEX-P) 37.5 MG tablet  Take one half tablet by mouth once daily before breakfast  . rosuvastatin (CRESTOR) 5 MG tablet TAKE 1 TABLET BY MOUTH EVERY DAY  . spironolactone (ALDACTONE) 50 MG tablet TAKE 1 TABLET BY MOUTH EVERY DAY  . terbinafine (LAMISIL) 250 MG tablet Take 1 tablet (250 mg total) by mouth daily.     Allergies:   Metformin and related   ROS:   Please see the history of present illness.    All other systems reviewed and are negative.   Labs/Other Tests and Data Reviewed:    Recent Labs: 01/13/2020: Hemoglobin  11.6; Platelets 252 06/15/2020: ALT 12; BUN 30; Creat 1.18; Potassium 4.6; Sodium 136; TSH 2.72   Recent Lipid Panel Lab Results  Component Value Date/Time   CHOL 166 06/15/2020 10:19 AM   TRIG 77 06/15/2020 10:19 AM   HDL 87 06/15/2020 10:19 AM   CHOLHDL 1.9 06/15/2020 10:19 AM   LDLCALC 63 06/15/2020 10:19 AM    Wt Readings from Last 3 Encounters:  08/28/20 238 lb (108 kg)  06/22/20 235 lb 1.9 oz (106.6 kg)  12/26/19 225 lb (102.1 kg)     Objective:    Vital Signs:  Ht _0  (1.651 m)   Wt 238 lb (108 kg)   BMI 39.61 kg/m    VITAL SIGNS:  reviewed GEN:  no acute distress RESPIRATORY:  no shortness of breath in conversation  PSYCH:  normal affect  ASSESSMENT & PLAN:     1. Nerve pain  - gabapentin (NEURONTIN) 100 MG capsule; Take 1 capsule (100 mg total) by mouth 2 (two) times daily.  Dispense: 60 capsule; Refill: 1 - Ambulatory referral to Orthopedic Surgery  2. Acute pain of right shoulder  - Ambulatory referral to Orthopedic Surgery   Time:   Today, I have spent 9 minutes with the patient with telehealth technology discussing the above problems.     Medication Adjustments/Labs and Tests Ordered: Current medicines are reviewed at length with the patient today.  Concerns regarding medicines are outlined above.   Tests Ordered: No orders of the defined types were placed in this encounter.   Medication Changes: No orders of the defined types were placed in this encounter.    Note: This dictation was prepared with Dragon dictation along with smaller phrase technology. Similar sounding words can be transcribed inadequately or may not be corrected upon review. Any transcriptional errors that result from this process are unintentional.      Disposition:  Follow up as scheduled Signed, Perlie Mayo, NP  08/28/2020 8:41 AM     [place]

## 2020-08-28 NOTE — Assessment & Plan Note (Signed)
Description sounds questionable neck, impingement or bursitis/ rotator issue Ortho referral made  NSAID if needed. NSAID precautions were reviewed with the patient.  Patient should take medication with food, discontinue if develops GI side effects, not to take other OTC NSAIDs at the same time, and not to use longer than recommended. OOW until Monday- note provided Patient acknowledged agreement and understanding of the plan.

## 2020-08-31 ENCOUNTER — Encounter: Payer: BC Managed Care – PPO | Admitting: Family Medicine

## 2020-09-03 ENCOUNTER — Encounter: Payer: Self-pay | Admitting: Family Medicine

## 2020-09-21 ENCOUNTER — Ambulatory Visit: Payer: BC Managed Care – PPO

## 2020-09-21 ENCOUNTER — Encounter: Payer: Self-pay | Admitting: Orthopedic Surgery

## 2020-09-21 ENCOUNTER — Other Ambulatory Visit: Payer: Self-pay

## 2020-09-21 ENCOUNTER — Ambulatory Visit: Payer: BC Managed Care – PPO | Admitting: Orthopedic Surgery

## 2020-09-21 VITALS — BP 142/73 | HR 80 | Ht 65.0 in | Wt 250.0 lb

## 2020-09-21 DIAGNOSIS — M792 Neuralgia and neuritis, unspecified: Secondary | ICD-10-CM | POA: Diagnosis not present

## 2020-09-21 DIAGNOSIS — Z6841 Body Mass Index (BMI) 40.0 and over, adult: Secondary | ICD-10-CM | POA: Diagnosis not present

## 2020-09-21 DIAGNOSIS — M25511 Pain in right shoulder: Secondary | ICD-10-CM

## 2020-09-21 MED ORDER — METHOCARBAMOL 500 MG PO TABS: 500.0000 mg | ORAL_TABLET | Freq: Three times a day (TID) | ORAL | 1 refills | Status: DC

## 2020-09-21 NOTE — Patient Instructions (Addendum)
CONTINUE MOBIC   AND START ROBAXIN FOR MUSCLE SPASMS   You have received an injection of steroids into the joint. 15% of patients will have increased pain within the 24 hours postinjection.   This is transient and will go away.   We recommend that you use ice packs on the injection site for 20 minutes every 2 hours and extra strength Tylenol 2 tablets every 8 as needed until the pain resolves.  If you continue to have pain after taking the Tylenol and using the ice please call the office for further instructions.    Shoulder Impingement Syndrome  Shoulder impingement syndrome is a condition that causes pain when connective tissues (tendons) surrounding the shoulder joint become pinched. These tendons are part of the group of muscles and tissues that help to stabilize the shoulder (rotator cuff). Beneath the rotator cuff is a fluid-filled sac (bursa) that allows the muscles and tendons to glide smoothly. The bursa may become swollen or irritated (bursitis). Bursitis, swelling in the rotator cuff tendons, or both conditions can decrease how much space is under a bone in the shoulder joint (acromion), resulting in impingement. What are the causes? Shoulder impingement syndrome may be caused by bursitis or swelling of the rotator cuff tendons, which may result from:  Repetitive overhead arm movements.  Falling onto the shoulder.  Weakness in the shoulder muscles. What increases the risk? You may be more likely to develop this condition if you:  Play sports that involve throwing, such as baseball.  Participate in sports such as tennis, volleyball, and swimming.  Work as a Education administrator, Music therapist, or Pharmacologist. Some people are also more likely to develop impingement syndrome because of the shape of their acromion bone. What are the signs or symptoms? The main symptom of this condition is pain on the front or side of the shoulder. The pain may:  Get worse when lifting or raising the  arm.  Get worse at night.  Wake you up from sleeping.  Feel sharp when the shoulder is moved and then fade to an ache. Other symptoms may include:  Tenderness.  Stiffness.  Inability to raise the arm above shoulder level or behind the body.  Weakness. How is this diagnosed? This condition may be diagnosed based on:  Your symptoms and medical history.  A physical exam.  Imaging tests, such as: ? X-rays. ? MRI. ? Ultrasound. How is this treated? This condition may be treated by:  Resting your shoulder and avoiding all activities that cause pain or put stress on the shoulder.  Icing your shoulder.  NSAIDs to help reduce pain and swelling.  One or more injections of medicines to numb the area and reduce inflammation.  Physical therapy.  Surgery. This may be needed if nonsurgical treatments have not helped. Surgery may involve repairing the rotator cuff, reshaping the acromion, or removing the bursa. Follow these instructions at home: Managing pain, stiffness, and swelling   If directed, put ice on the injured area. ? Put ice in a plastic bag. ? Place a towel between your skin and the bag. ? Leave the ice on for 20 minutes, 2-3 times a day. Activity  Rest and return to your normal activities as told by your health care provider. Ask your health care provider what activities are safe for you.  Do exercises as told by your health care provider. General instructions  Do not use any products that contain nicotine or tobacco, such as cigarettes, e-cigarettes, and chewing tobacco. These  can delay healing. If you need help quitting, ask your health care provider.  Ask your health care provider when it is safe for you to drive.  Take over-the-counter and prescription medicines only as told by your health care provider.  Keep all follow-up visits as told by your health care provider. This is important. How is this prevented?  Give your body time to rest between  periods of activity.  Be safe and responsible while being active. This will help you avoid falls.  Maintain physical fitness, including strength and flexibility. Contact a health care provider if:  Your symptoms have not improved after 1-2 months of treatment and rest.  You cannot lift your arm away from your body. Summary  Shoulder impingement syndrome is a condition that causes pain when connective tissues (tendons) surrounding the shoulder joint become pinched.  The main symptom of this condition is pain on the front or side of the shoulder.  This condition is usually treated with rest, ice, and pain medicines as needed. This information is not intended to replace advice given to you by your health care provider. Make sure you discuss any questions you have with your health care provider. Document Revised: 02/01/2019 Document Reviewed: 04/04/2018 Elsevier Patient Education  2020 [employer].

## 2020-09-21 NOTE — Progress Notes (Signed)
Chief Complaint  Patient presents with  . 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  Her doctor thought she might have carpal tunnel  However the whole hand gets numb and tingly and the pain is radicular in nature  System review she reported no other issues other than the muscle ache in the joint pain  Past Medical History:  Diagnosis Date  . Diabetes mellitus   . Hypertension    Past Surgical History:  Procedure Laterality Date  . CESAREAN SECTION     Family History  Problem Relation Age of Onset  . Hypertension Mother   . Diabetes Mother   . Hyperlipidemia Mother   . Diabetes Brother     BP (!) 142/73   Pulse 80   Ht 5\' 5"  (1.651 m)   Wt 250 lb (113.4 kg)   BMI 41.60 kg/m   The patient meets the AMA guidelines for Morbid (severe) obesity with a BMI > 40.0 and I have recommended weight loss.  Patient is noted to have normal development grooming and hygiene despite the elevated BMI she is oriented x3 she has a pleasant mood her affect is normal she has no problems walking  Right shoulder skin looks normal left shoulder skin looks normal.  She has normal strength in both rotator cuffs in both shoulders are stable while the right shoulder has normal range of motion the left shoulder does not  Cervical spine shows no degenerative changes slight loss in cervical lordosis  We injected the left shoulder in error   I made her aware and she came back to inject the right shoulder    Procedure note the subacromial injection shoulder RIGHT    Verbal consent was obtained to inject the  RIGHT   Shoulder  Timeout was completed to confirm the injection site is a subacromial space of the  RIGHT  shoulder   Medication used Depo-Medrol  40 mg and lidocaine 1% 3 cc  Anesthesia was provided by ethyl chloride  The injection was performed in the RIGHT  posterior subacromial space. After pinning the skin with alcohol and anesthetized the skin with ethyl chloride the subacromial space was injected using a 20-gauge needle. There were no complications  Sterile dressing was applied.  Encounter Diagnoses  Name Primary?  . Acute pain of right shoulder Yes  . Radicular pain of right upper extremity   . Body mass index 40.0-44.9, adult (HCC)   . Morbid obesity (HCC)

## 2020-10-07 ENCOUNTER — Encounter: Payer: BC Managed Care – PPO | Admitting: Family Medicine

## 2020-10-19 ENCOUNTER — Other Ambulatory Visit: Payer: Self-pay

## 2020-10-19 MED ORDER — LISINOPRIL-HYDROCHLOROTHIAZIDE 20-12.5 MG PO TABS: 2.0000 | ORAL_TABLET | Freq: Every day | ORAL | 1 refills | Status: DC

## 2020-10-21 ENCOUNTER — Other Ambulatory Visit: Payer: Self-pay | Admitting: *Deleted

## 2020-10-21 MED ORDER — SPIRONOLACTONE 50 MG PO TABS: 50.0000 mg | ORAL_TABLET | Freq: Every day | ORAL | 1 refills | Status: DC

## 2020-10-22 ENCOUNTER — Encounter: Payer: BC Managed Care – PPO | Admitting: Family Medicine

## 2020-11-02 ENCOUNTER — Ambulatory Visit: Payer: BC Managed Care – PPO | Admitting: Orthopedic Surgery

## 2020-11-03 ENCOUNTER — Other Ambulatory Visit: Payer: Self-pay | Admitting: Family Medicine

## 2020-11-09 ENCOUNTER — Other Ambulatory Visit: Payer: Self-pay | Admitting: Orthopedic Surgery

## 2020-11-09 DIAGNOSIS — M792 Neuralgia and neuritis, unspecified: Secondary | ICD-10-CM

## 2020-11-10 ENCOUNTER — Other Ambulatory Visit: Payer: BC Managed Care – PPO

## 2020-11-12 ENCOUNTER — Other Ambulatory Visit: Payer: Self-pay

## 2020-11-12 ENCOUNTER — Encounter: Payer: Self-pay | Admitting: Orthopedic Surgery

## 2020-11-12 ENCOUNTER — Ambulatory Visit (INDEPENDENT_AMBULATORY_CARE_PROVIDER_SITE_OTHER): Payer: BC Managed Care – PPO | Admitting: Orthopedic Surgery

## 2020-11-12 VITALS — Ht 65.0 in | Wt 250.0 lb

## 2020-11-12 DIAGNOSIS — M25511 Pain in right shoulder: Secondary | ICD-10-CM

## 2020-11-12 DIAGNOSIS — G8929 Other chronic pain: Secondary | ICD-10-CM

## 2020-11-12 DIAGNOSIS — M75111 Incomplete rotator cuff tear or rupture of right shoulder, not specified as traumatic: Secondary | ICD-10-CM

## 2020-11-12 MED ORDER — CELECOXIB 200 MG PO CAPS: 200.0000 mg | ORAL_CAPSULE | Freq: Two times a day (BID) | ORAL | 0 refills | Status: DC

## 2020-11-12 NOTE — Progress Notes (Signed)
Chief Complaint  Patient presents with  . 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  Her doctor thought she might have carpal tunnel   However the whole hand gets numb and tingly and the pain is radicular in nature   System review she reported no other issues other than the muscle ache in the joint pain   We gave her an injection in the right shoulder   11/12/2020 visit two: Today she says she is not any better despite the injection and several weeks of meloxicam  She confirms that her pain is in the right shoulder starting at the top of the shoulder radiating to but not below the elbow  She relates this to the job she does where she is constantly pulling things at work across her body  Physical Exam Musculoskeletal:     Comments: Right shoulder  Skin is normal clean no erythema Tenderness rotator cuff interval and lateral deltoid Abduction 90 degrees active 120 degrees passive Pain arc 90-1 20 Rotator cuff pain in the empty can position with weakness Stability normal    Recommend MRI right shoulder Suspect rotator cuff tear Start Celebrex 200 mg twice a day stop meloxicam  Meds ordered this encounter  Medications  . celecoxib (CELEBREX) 200 MG capsule    Sig: Take 1 capsule (200 mg total) by mouth 2 (two) times daily.    Dispense:  60 capsule    Refill:  0

## 2020-11-12 NOTE — Patient Instructions (Signed)
After your shift appli heat to your shoulder   Start celebrex 200 mg twice a day   Return after your mri

## 2020-11-13 ENCOUNTER — Other Ambulatory Visit: Payer: BC Managed Care – PPO

## 2020-11-17 ENCOUNTER — Telehealth: Payer: Self-pay

## 2020-11-17 NOTE — Telephone Encounter (Signed)
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

## 2020-11-17 NOTE — Telephone Encounter (Signed)
APPROVED

## 2020-11-23 ENCOUNTER — Telehealth: Payer: Self-pay

## 2020-11-23 DIAGNOSIS — E1159 Type 2 diabetes mellitus with other circulatory complications: Secondary | ICD-10-CM

## 2020-11-23 DIAGNOSIS — I10 Essential (primary) hypertension: Secondary | ICD-10-CM

## 2020-11-23 DIAGNOSIS — E782 Mixed hyperlipidemia: Secondary | ICD-10-CM

## 2020-11-23 NOTE — Telephone Encounter (Signed)
Pt is calling in wants to get labs completed, but there is no order. Please advise if lab work is needed. And call the pt to advise

## 2020-11-23 NOTE — Telephone Encounter (Signed)
Pls order fasting CBC, lipid, cmp and EGFr and HBA1C , thanks

## 2020-11-24 ENCOUNTER — Other Ambulatory Visit: Payer: Self-pay

## 2020-11-24 ENCOUNTER — Encounter: Payer: Self-pay | Admitting: Orthopedic Surgery

## 2020-11-24 ENCOUNTER — Telehealth: Payer: Self-pay | Admitting: Orthopedic Surgery

## 2020-11-24 DIAGNOSIS — E782 Mixed hyperlipidemia: Secondary | ICD-10-CM | POA: Diagnosis not present

## 2020-11-24 DIAGNOSIS — E1159 Type 2 diabetes mellitus with other circulatory complications: Secondary | ICD-10-CM | POA: Diagnosis not present

## 2020-11-24 NOTE — Telephone Encounter (Signed)
Dr Romeo Apple said ok to give note for light duty  Can you get her the note,  Or do you need me to do it?

## 2020-11-24 NOTE — Telephone Encounter (Signed)
Patient called and states she has sent a message on MyChart 2 times and is requesting light duty for her job.    If you could please call her and talk with her about her job  duties    Her number is [phone]

## 2020-11-24 NOTE — Telephone Encounter (Signed)
Pt aware.

## 2020-11-24 NOTE — Telephone Encounter (Signed)
If you could please do it would be great!  Thank You!

## 2020-11-25 LAB — CMP14+EGFR
ALT: 19 IU/L (ref 0–32)
AST: 14 IU/L (ref 0–40)
Albumin/Globulin Ratio: 1.6 (ref 1.2–2.2)
Albumin: 4.2 g/dL (ref 3.8–4.8)
Alkaline Phosphatase: 64 IU/L (ref 44–121)
BUN/Creatinine Ratio: 22 (ref 9–23)
BUN: 25 mg/dL — ABNORMAL HIGH (ref 6–24)
Bilirubin Total: 0.4 mg/dL (ref 0.0–1.2)
CO2: 23 mmol/L (ref 20–29)
Calcium: 9.7 mg/dL (ref 8.7–10.2)
Chloride: 98 mmol/L (ref 96–106)
Creatinine, Ser: 1.13 mg/dL — ABNORMAL HIGH (ref 0.57–1.00)
GFR calc Af Amer: 65 mL/min/{1.73_m2} (ref >59)
GFR calc non Af Amer: 57 mL/min/{1.73_m2} — ABNORMAL LOW (ref >59)
Globulin, Total: 2.6 g/dL (ref 1.5–4.5)
Glucose: 231 mg/dL — ABNORMAL HIGH (ref 65–99)
Potassium: 4.7 mmol/L (ref 3.5–5.2)
Sodium: 135 mmol/L (ref 134–144)
Total Protein: 6.8 g/dL (ref 6.0–8.5)

## 2020-11-25 LAB — LIPID PANEL
Chol/HDL Ratio: 2.8 ratio (ref 0.0–4.4)
Cholesterol, Total: 184 mg/dL (ref 100–199)
HDL: 66 mg/dL (ref >39)
LDL Chol Calc (NIH): 99 mg/dL (ref 0–99)
Triglycerides: 109 mg/dL (ref 0–149)
VLDL Cholesterol Cal: 19 mg/dL (ref 5–40)

## 2020-11-25 LAB — CBC
Hematocrit: 37.4 % (ref 34.0–46.6)
Hemoglobin: 12.3 g/dL (ref 11.1–15.9)
MCH: 27.9 pg (ref 26.6–33.0)
MCHC: 32.9 g/dL (ref 31.5–35.7)
MCV: 85 fL (ref 79–97)
Platelets: 249 10*3/uL (ref 150–450)
RBC: 4.41 x10E6/uL (ref 3.77–5.28)
RDW: 11.9 % (ref 11.7–15.4)
WBC: 7.5 10*3/uL (ref 3.4–10.8)

## 2020-11-25 LAB — HEMOGLOBIN A1C
Est. average glucose Bld gHb Est-mCnc: 214 mg/dL
Hgb A1c MFr Bld: 9.1 % — ABNORMAL HIGH (ref 4.8–5.6)

## 2020-11-27 ENCOUNTER — Ambulatory Visit (HOSPITAL_COMMUNITY): Payer: BC Managed Care – PPO

## 2020-11-30 ENCOUNTER — Encounter: Payer: BC Managed Care – PPO | Admitting: Family Medicine

## 2020-12-01 ENCOUNTER — Encounter: Payer: Self-pay | Admitting: Internal Medicine

## 2020-12-01 ENCOUNTER — Other Ambulatory Visit: Payer: Self-pay

## 2020-12-01 ENCOUNTER — Ambulatory Visit (INDEPENDENT_AMBULATORY_CARE_PROVIDER_SITE_OTHER): Payer: BC Managed Care – PPO | Admitting: Internal Medicine

## 2020-12-01 VITALS — BP 133/72 | HR 94 | Temp 98.6°F | Resp 18 | Ht 65.0 in | Wt 254.0 lb

## 2020-12-01 DIAGNOSIS — E1159 Type 2 diabetes mellitus with other circulatory complications: Secondary | ICD-10-CM | POA: Diagnosis not present

## 2020-12-01 DIAGNOSIS — E782 Mixed hyperlipidemia: Secondary | ICD-10-CM

## 2020-12-01 DIAGNOSIS — I10 Essential (primary) hypertension: Secondary | ICD-10-CM | POA: Diagnosis not present

## 2020-12-01 DIAGNOSIS — Z0000 Encounter for general adult medical examination without abnormal findings: Secondary | ICD-10-CM

## 2020-12-01 DIAGNOSIS — Z1211 Encounter for screening for malignant neoplasm of colon: Secondary | ICD-10-CM

## 2020-12-01 MED ORDER — GLIPIZIDE ER 10 MG PO TB24: 10.0000 mg | ORAL_TABLET | Freq: Every day | ORAL | 0 refills | Status: DC

## 2020-12-01 MED ORDER — RYBELSUS 3 MG PO TABS: 1.0000 | ORAL_TABLET | Freq: Every day | ORAL | 0 refills | Status: DC

## 2020-12-01 MED ORDER — RYBELSUS 7 MG PO TABS: 1.0000 | ORAL_TABLET | Freq: Every day | ORAL | 1 refills | Status: DC

## 2020-12-01 NOTE — Patient Instructions (Addendum)
Please start taking Rybelsus 3 mg once daily for 1 month. If you tolerate it well, please start taking 7 mg once daily.  Please start taking Glipizide 10 mg once daily instead of 5 mg.  Please continue to take Metformin 500 mg twice daily.  Please continue to check blood glucose at home and bring the log in the next visit.  Please follow low carbohydrate diet and perform moderate exercise/walking as tolerated.

## 2020-12-01 NOTE — Assessment & Plan Note (Signed)
Due to excess calorie and less exercise especially due to foot pain Advised to follow low carbohydrate diet and resume exercise as tolerated Started Rybelsus mainly for DM

## 2020-12-01 NOTE — Assessment & Plan Note (Signed)
Annual exam as documented. Counseling done  re healthy lifestyle involving commitment to 150 minutes exercise per week, heart healthy diet, and attaining healthy weight.The importance of adequate sleep also discussed. Changes in health habits are decided on by the patient with goals and time frames  set for achieving them. Immunization and cancer screening needs are specifically addressed at this visit. 

## 2020-12-01 NOTE — Assessment & Plan Note (Signed)
BP Readings from Last 1 Encounters:  12/01/20 133/72   Well-controlled with Amlodipine, Lisinopril-HCTZ and Spironolactone Counseled for compliance with the medications Advised DASH diet and moderate exercise/walking, at least 150 mins/week

## 2020-12-01 NOTE — Assessment & Plan Note (Signed)
On Crestor 

## 2020-12-01 NOTE — Progress Notes (Signed)
Established Patient Office Visit  Subjective:  Patient ID: Glenda Thompson, female    DOB: 10-26-1970  Age: 50 y.o. MRN: [id number]  CC:  Chief Complaint  Patient presents with  . Annual Exam    Annual exam pt said her sugars and bp has been up since starting celebrex that was prescribed by the ortho    HPI Glenda Thompson is a 50 year old female with PMH of HTN, type 2 DM, HLD and morbid obesity who presents for annual physical.  She has been having foot pain and shoulder pain, for which she has been following up with Orthopedic surgeon. She has had steroid injections for pain/swelling. Her HbA1C is 9.1 and his blood glucose has been running in 200s most of the time. She has not been able to exercise due to her foot pain. But she is planning to resume it soon as her foot pain is better now.  BP is well-controlled. Takes medications regularly. Patient denies headache, dizziness, chest pain, dyspnea or palpitations.  Blood tests were reviewed and discussed with the patient in detail.  Past Medical History:  Diagnosis Date  . Diabetes mellitus   . Hypertension     Past Surgical History:  Procedure Laterality Date  . CESAREAN SECTION      Family History  Problem Relation Age of Onset  . Hypertension Mother   . Diabetes Mother   . Hyperlipidemia Mother   . Diabetes Brother     Social History   Socioeconomic History  . Marital status: Married    Spouse name: Not on file  . Number of children: Not on file  . Years of education: Not on file  . Highest education level: Not on file  Occupational History  . Not on file  Tobacco Use  . Smoking status: Never Smoker  . Smokeless tobacco: Never Used  Vaping Use  . Vaping Use: Never used  Substance and Sexual Activity  . Alcohol use: Never  . Drug use: Never  . Sexual activity: Not on file  Other Topics Concern  . Not on file  Social History Narrative  . Not on file   Social Determinants of Health   Financial  Resource Strain: Not on file  Food Insecurity: Not on file  Transportation Needs: Not on file  Physical Activity: Not on file  Stress: Not on file  Social Connections: Not on file  Intimate Partner Violence: Not on file    Outpatient Medications Prior to Visit  Medication Sig Dispense Refill  . amLODipine (NORVASC) 10 MG tablet TAKE 1 TABLET(10 MG) BY MOUTH DAILY 90 tablet 1  . blood glucose meter kit and supplies Three times daily dx E11.65 Freestyle Lite 1 each 0  . celecoxib (CELEBREX) 200 MG capsule Take 1 capsule (200 mg total) by mouth 2 (two) times daily. 60 capsule 0  . fluticasone (FLONASE) 50 MCG/ACT nasal spray Place 2 sprays into both nostrils daily. 16 g 12  . gabapentin (NEURONTIN) 100 MG capsule Take 1 capsule (100 mg total) by mouth 2 (two) times daily. 60 capsule 1  . glucose blood (FREESTYLE LITE) test strip USE TO TEST THREE TIMES DAILY 100 each 5  . Insulin Pen Needle (B-D ULTRAFINE III SHORT PEN) 31G X 8 MM MISC Use as directed to inject insulin daily. 100 each 2  . Lancets (FREESTYLE) lancets USE TO OBTAIN A BLOOD SPECIMEN TO TEST BLOOD SUGAR TWICE A DAY 200 each 5  . lisinopril-hydrochlorothiazide (ZESTORETIC) 20-12.5 MG tablet Take  2 tablets by mouth daily. 180 tablet 1  . meloxicam (MOBIC) 15 MG tablet Take 15 mg by mouth daily.    . metFORMIN (GLUCOPHAGE-XR) 500 MG 24 hr tablet TAKE 1 TABLET BY MOUTH TWICE A DAY 180 tablet 1  . rosuvastatin (CRESTOR) 5 MG tablet TAKE 1 TABLET BY MOUTH EVERY DAY 90 tablet 1  . spironolactone (ALDACTONE) 50 MG tablet Take 1 tablet (50 mg total) by mouth daily. 90 tablet 1  . terbinafine (LAMISIL) 250 MG tablet Take 1 tablet (250 mg total) by mouth daily. 42 tablet 1  . glipiZIDE (GLUCOTROL XL) 5 MG 24 hr tablet TAKE 1 TABLET BY MOUTH EVERY DAY WITH BREAKFAST 90 tablet 1  . methocarbamol (ROBAXIN) 500 MG tablet TAKE 1 TABLET BY MOUTH THREE TIMES A DAY (Patient not taking: Reported on 12/01/2020) 60 tablet 1   No facility-administered  medications prior to visit.    Allergies  Allergen Reactions  . Metformin And Related Diarrhea    ROS Review of Systems  Constitutional: Negative for chills and fever.  HENT: Negative for congestion, sinus pressure, sinus pain and sore throat.   Eyes: Negative for pain and discharge.  Respiratory: Negative for cough and shortness of breath.   Cardiovascular: Negative for chest pain and palpitations.  Gastrointestinal: Negative for abdominal pain, constipation, diarrhea, nausea and vomiting.  Endocrine: Negative for polydipsia and polyuria.  Genitourinary: Negative for dysuria and hematuria.  Musculoskeletal: Positive for arthralgias. Negative for neck pain and neck stiffness.  Skin: Negative for rash.  Neurological: Negative for dizziness and weakness.  Psychiatric/Behavioral: Negative for agitation and behavioral problems.      Objective:    Physical Exam Vitals reviewed.  Constitutional:      General: She is not in acute distress.    Appearance: She is obese. She is not diaphoretic.  HENT:     Head: Normocephalic and atraumatic.     Nose: Nose normal. No congestion.     Mouth/Throat:     Mouth: Mucous membranes are moist.     Pharynx: No posterior oropharyngeal erythema.  Eyes:     General: No scleral icterus.    Extraocular Movements: Extraocular movements intact.     Pupils: Pupils are equal, round, and reactive to light.  Cardiovascular:     Rate and Rhythm: Normal rate and regular rhythm.     Pulses: Normal pulses.     Heart sounds: Normal heart sounds. No murmur heard.   Pulmonary:     Breath sounds: Normal breath sounds. No wheezing or rales.  Abdominal:     Palpations: Abdomen is soft.     Tenderness: There is no abdominal tenderness.  Musculoskeletal:     Cervical back: Neck supple. No tenderness.     Right lower leg: No edema.     Left lower leg: No edema.  Skin:    General: Skin is warm.     Findings: No rash.  Neurological:     General: No  focal deficit present.     Mental Status: She is alert and oriented to person, place, and time.     Sensory: No sensory deficit.     Motor: No weakness.  Psychiatric:        Mood and Affect: Mood normal.        Behavior: Behavior normal.     BP 133/72 (BP Location: Right Arm, Patient Position: Sitting)   Pulse 94   Temp 98.6 F (37 C) (Oral)   Resp 18  Ht '5\' 5"'  (1.651 m)   Wt 254 lb (115.2 kg)   SpO2 98%   BMI 42.27 kg/m  Wt Readings from Last 3 Encounters:  12/01/20 254 lb (115.2 kg)  11/12/20 250 lb (113.4 kg)  09/21/20 250 lb (113.4 kg)     Health Maintenance Due  Topic Date Due  . COLONOSCOPY (Pts 45-38yr Insurance coverage will need to be confirmed)  Never done  . OPHTHALMOLOGY EXAM  04/10/2020  . COVID-19 Vaccine (3 - Booster for Moderna series) 10/11/2020    There are no preventive care reminders to display for this patient.  Lab Results  Component Value Date   TSH 2.72 06/15/2020   Lab Results  Component Value Date   WBC 7.5 11/24/2020   HGB 12.3 11/24/2020   HCT 37.4 11/24/2020   MCV 85 11/24/2020   PLT 249 11/24/2020   Lab Results  Component Value Date   NA 135 11/24/2020   K 4.7 11/24/2020   CO2 23 11/24/2020   GLUCOSE 231 (H) 11/24/2020   BUN 25 (H) 11/24/2020   CREATININE 1.13 (H) 11/24/2020   BILITOT 0.4 11/24/2020   ALKPHOS 64 11/24/2020   AST 14 11/24/2020   ALT 19 11/24/2020   PROT 6.8 11/24/2020   ALBUMIN 4.2 11/24/2020   CALCIUM 9.7 11/24/2020   ANIONGAP 7 05/12/2018   Lab Results  Component Value Date   CHOL 184 11/24/2020   Lab Results  Component Value Date   HDL 66 11/24/2020   Lab Results  Component Value Date   LDLCALC 99 11/24/2020   Lab Results  Component Value Date   TRIG 109 11/24/2020   Lab Results  Component Value Date   CHOLHDL 2.8 11/24/2020   Lab Results  Component Value Date   HGBA1C 9.1 (H) 11/24/2020      Assessment & Plan:   Problem List Items Addressed This Visit      Annual physical  exam - Primary   Annual exam as documented. Counseling done  re healthy lifestyle involving commitment to 150 minutes exercise per week, heart healthy diet, and attaining healthy weight.The importance of adequate sleep also discussed. Changes in health habits are decided on by the patient with goals and time frames  set for achieving them. Immunization and cancer screening needs are specifically addressed at this visit.      Cardiovascular and Mediastinum   Type 2 diabetes mellitus with vascular disease ([place]    Lab Results  Component Value Date   HGBA1C 9.1 (H) 11/24/2020   Reports taking steroid injections recently for foot and shoulder pain Home glucose readings more than 200 most of the time Started Rybelsus 3 mg QD, advance dose if tolerated Increased Glipizide to 10 mg QD Continue Metformin 500 mg BID      Relevant Medications   Semaglutide (RYBELSUS) 3 MG TABS   Semaglutide (RYBELSUS) 7 MG TABS (Start on 12/31/2020)   glipiZIDE (GLUCOTROL XL) 10 MG 24 hr tablet   Essential hypertension, benign    BP Readings from Last 1 Encounters:  12/01/20 133/72   Well-controlled with Amlodipine, Lisinopril-HCTZ and Spironolactone Counseled for compliance with the medications Advised DASH diet and moderate exercise/walking, at least 150 mins/week         Other   Mixed hyperlipidemia    On Crestor      Morbid obesity [place]    Due to excess calorie and less exercise especially due to foot pain Advised to follow low carbohydrate diet and resume exercise as  tolerated Started Rybelsus mainly for DM      Relevant Medications   Semaglutide (RYBELSUS) 3 MG TABS   Semaglutide (RYBELSUS) 7 MG TABS (Start on 12/31/2020)   glipiZIDE (GLUCOTROL XL) 10 MG 24 hr tablet         Other Visit Diagnoses    Special screening for malignant neoplasms, colon       Relevant Orders   Ambulatory referral to Gastroenterology      Meds ordered this encounter  Medications  . Semaglutide  (RYBELSUS) 3 MG TABS    Sig: Take 1 tablet by mouth daily.    Dispense:  30 tablet    Refill:  0  . Semaglutide (RYBELSUS) 7 MG TABS    Sig: Take 1 tablet by mouth daily.    Dispense:  30 tablet    Refill:  1  . glipiZIDE (GLUCOTROL XL) 10 MG 24 hr tablet    Sig: Take 1 tablet (10 mg total) by mouth daily with breakfast.    Dispense:  90 tablet    Refill:  0    Follow-up: Return in about 3 months (around 02/28/2021) for HbA1C and HTN.    Lindell Spar, MD

## 2020-12-01 NOTE — Assessment & Plan Note (Signed)
Lab Results  Component Value Date   HGBA1C 9.1 (H) 11/24/2020   Reports taking steroid injections recently for foot and shoulder pain Home glucose readings more than 200 most of the time Started Rybelsus 3 mg QD, advance dose if tolerated Increased Glipizide to 10 mg QD Continue Metformin 500 mg BID

## 2020-12-02 ENCOUNTER — Encounter (INDEPENDENT_AMBULATORY_CARE_PROVIDER_SITE_OTHER): Payer: Self-pay | Admitting: *Deleted

## 2020-12-03 ENCOUNTER — Telehealth: Payer: Self-pay

## 2020-12-03 ENCOUNTER — Other Ambulatory Visit: Payer: Self-pay

## 2020-12-03 ENCOUNTER — Ambulatory Visit: Payer: BC Managed Care – PPO | Admitting: Orthopedic Surgery

## 2020-12-03 ENCOUNTER — Ambulatory Visit (HOSPITAL_COMMUNITY)
Admission: RE | Admit: 2020-12-03 | Discharge: 2020-12-03 | Disposition: A | Discharge status: Home / Self Care | Payer: BC Managed Care – PPO | Source: Ambulatory Visit | Attending: Orthopedic Surgery | Admitting: Orthopedic Surgery

## 2020-12-03 DIAGNOSIS — G8929 Other chronic pain: Secondary | ICD-10-CM | POA: Diagnosis not present

## 2020-12-03 DIAGNOSIS — M25511 Pain in right shoulder: Secondary | ICD-10-CM | POA: Diagnosis not present

## 2020-12-03 NOTE — Telephone Encounter (Signed)
Ok per Dr Romeo Apple. Have put in chart, sent her a mychart message to advise.

## 2020-12-03 NOTE — Telephone Encounter (Signed)
Patient called need a new return to work note with no restrictions. Patient returning on the 12/07/20.

## 2020-12-05 ENCOUNTER — Other Ambulatory Visit: Payer: Self-pay | Admitting: Orthopedic Surgery

## 2020-12-05 DIAGNOSIS — G8929 Other chronic pain: Secondary | ICD-10-CM

## 2020-12-05 DIAGNOSIS — M75111 Incomplete rotator cuff tear or rupture of right shoulder, not specified as traumatic: Secondary | ICD-10-CM

## 2020-12-05 NOTE — Telephone Encounter (Signed)
Rx request 

## 2020-12-06 ENCOUNTER — Other Ambulatory Visit: Payer: Self-pay | Admitting: Family Medicine

## 2020-12-07 ENCOUNTER — Encounter: Payer: BC Managed Care – PPO | Admitting: Family Medicine

## 2020-12-09 ENCOUNTER — Other Ambulatory Visit: Payer: Self-pay | Admitting: Family Medicine

## 2020-12-10 ENCOUNTER — Encounter: Payer: Self-pay | Admitting: Orthopedic Surgery

## 2020-12-10 ENCOUNTER — Telehealth: Payer: Self-pay | Admitting: Orthopedic Surgery

## 2020-12-10 ENCOUNTER — Ambulatory Visit: Payer: BC Managed Care – PPO | Admitting: Orthopedic Surgery

## 2020-12-10 NOTE — Telephone Encounter (Signed)
Patient's results relayed to her she does not have rotator cuff tear she has AC joint arthritis and biceps tenosynovitis  I recommend that she stay out of work for 2 weeks starting on Monday, February 21 return to work March 7

## 2020-12-24 ENCOUNTER — Encounter: Payer: Self-pay | Admitting: Orthopedic Surgery

## 2020-12-31 ENCOUNTER — Other Ambulatory Visit: Payer: Self-pay | Admitting: Orthopedic Surgery

## 2020-12-31 DIAGNOSIS — M25511 Pain in right shoulder: Secondary | ICD-10-CM

## 2020-12-31 DIAGNOSIS — G8929 Other chronic pain: Secondary | ICD-10-CM

## 2020-12-31 DIAGNOSIS — M75111 Incomplete rotator cuff tear or rupture of right shoulder, not specified as traumatic: Secondary | ICD-10-CM

## 2021-01-07 ENCOUNTER — Encounter: Payer: Self-pay | Admitting: Orthopedic Surgery

## 2021-01-13 ENCOUNTER — Other Ambulatory Visit: Payer: Self-pay | Admitting: Family Medicine

## 2021-01-17 ENCOUNTER — Emergency Department (HOSPITAL_COMMUNITY)
Admission: EM | Admit: 2021-01-17 | Discharge: 2021-01-18 | Disposition: A | Discharge status: Home / Self Care | Payer: BC Managed Care – PPO | Attending: Emergency Medicine | Admitting: Emergency Medicine

## 2021-01-17 ENCOUNTER — Encounter (HOSPITAL_COMMUNITY): Payer: Self-pay | Admitting: Emergency Medicine

## 2021-01-17 ENCOUNTER — Other Ambulatory Visit: Payer: Self-pay

## 2021-01-17 DIAGNOSIS — Z79899 Other long term (current) drug therapy: Secondary | ICD-10-CM | POA: Insufficient documentation

## 2021-01-17 DIAGNOSIS — I10 Essential (primary) hypertension: Secondary | ICD-10-CM | POA: Insufficient documentation

## 2021-01-17 DIAGNOSIS — R109 Unspecified abdominal pain: Secondary | ICD-10-CM | POA: Diagnosis not present

## 2021-01-17 DIAGNOSIS — K529 Noninfective gastroenteritis and colitis, unspecified: Secondary | ICD-10-CM

## 2021-01-17 DIAGNOSIS — R1030 Lower abdominal pain, unspecified: Secondary | ICD-10-CM | POA: Diagnosis not present

## 2021-01-17 DIAGNOSIS — R197 Diarrhea, unspecified: Secondary | ICD-10-CM | POA: Insufficient documentation

## 2021-01-17 DIAGNOSIS — Z7984 Long term (current) use of oral hypoglycemic drugs: Secondary | ICD-10-CM | POA: Insufficient documentation

## 2021-01-17 DIAGNOSIS — R112 Nausea with vomiting, unspecified: Secondary | ICD-10-CM | POA: Diagnosis not present

## 2021-01-17 DIAGNOSIS — Z794 Long term (current) use of insulin: Secondary | ICD-10-CM | POA: Insufficient documentation

## 2021-01-17 DIAGNOSIS — R1110 Vomiting, unspecified: Secondary | ICD-10-CM | POA: Diagnosis not present

## 2021-01-17 DIAGNOSIS — E1159 Type 2 diabetes mellitus with other circulatory complications: Secondary | ICD-10-CM | POA: Insufficient documentation

## 2021-01-17 HISTORY — DX: Unspecified osteoarthritis, unspecified site: M19.90

## 2021-01-17 HISTORY — DX: Hyperlipidemia, unspecified: E78.5

## 2021-01-17 LAB — CBC WITH DIFFERENTIAL/PLATELET
Abs Immature Granulocytes: 0.03 10*3/uL (ref 0.00–0.07)
Basophils Absolute: 0.1 10*3/uL (ref 0.0–0.1)
Basophils Relative: 1 %
Eosinophils Absolute: 0.3 10*3/uL (ref 0.0–0.5)
Eosinophils Relative: 2 %
HCT: 37.3 % (ref 36.0–46.0)
Hemoglobin: 11.8 g/dL — ABNORMAL LOW (ref 12.0–15.0)
Immature Granulocytes: 0 %
Lymphocytes Relative: 36 %
Lymphs Abs: 4.5 10*3/uL — ABNORMAL HIGH (ref 0.7–4.0)
MCH: 27.8 pg (ref 26.0–34.0)
MCHC: 31.6 g/dL (ref 30.0–36.0)
MCV: 88 fL (ref 80.0–100.0)
Monocytes Absolute: 0.7 10*3/uL (ref 0.1–1.0)
Monocytes Relative: 6 %
Neutro Abs: 6.9 10*3/uL (ref 1.7–7.7)
Neutrophils Relative %: 55 %
Platelets: 296 10*3/uL (ref 150–400)
RBC: 4.24 MIL/uL (ref 3.87–5.11)
RDW: 12.9 % (ref 11.5–15.5)
WBC: 12.5 10*3/uL — ABNORMAL HIGH (ref 4.0–10.5)
nRBC: 0 % (ref 0.0–0.2)

## 2021-01-17 LAB — COMPREHENSIVE METABOLIC PANEL
ALT: 19 U/L (ref 0–44)
AST: 20 U/L (ref 15–41)
Albumin: 4.3 g/dL (ref 3.5–5.0)
Alkaline Phosphatase: 44 U/L (ref 38–126)
Anion gap: 12 (ref 5–15)
BUN: 25 mg/dL — ABNORMAL HIGH (ref 6–20)
CO2: 26 mmol/L (ref 22–32)
Calcium: 9.7 mg/dL (ref 8.9–10.3)
Chloride: 100 mmol/L (ref 98–111)
Creatinine, Ser: 1.42 mg/dL — ABNORMAL HIGH (ref 0.44–1.00)
GFR, Estimated: 45 mL/min — ABNORMAL LOW (ref >60)
Glucose, Bld: 137 mg/dL — ABNORMAL HIGH (ref 70–99)
Potassium: 3.7 mmol/L (ref 3.5–5.1)
Sodium: 138 mmol/L (ref 135–145)
Total Bilirubin: 0.4 mg/dL (ref 0.3–1.2)
Total Protein: 7.6 g/dL (ref 6.5–8.1)

## 2021-01-17 LAB — LIPASE, BLOOD: Lipase: 31 U/L (ref 11–51)

## 2021-01-17 LAB — POC URINE PREG, ED: Preg Test, Ur: NEGATIVE

## 2021-01-17 MED ORDER — SODIUM CHLORIDE 0.9 % IV BOLUS
1000.0000 mL | Freq: Once | INTRAVENOUS | Status: AC
  Administered 2021-01-17: 1000 mL via INTRAVENOUS

## 2021-01-17 MED ORDER — SODIUM CHLORIDE 0.9 % IV BOLUS
500.0000 mL | Freq: Once | INTRAVENOUS | Status: AC
  Administered 2021-01-17: 500 mL via INTRAVENOUS

## 2021-01-17 MED ORDER — ONDANSETRON HCL 4 MG/2ML IJ SOLN
4.0000 mg | Freq: Once | INTRAMUSCULAR | Status: AC
  Administered 2021-01-17: 4 mg via INTRAVENOUS
  Filled 2021-01-17: qty 2

## 2021-01-17 NOTE — ED Notes (Signed)
ED Provider at bedside. 

## 2021-01-17 NOTE — Discharge Instructions (Addendum)
At this time there does not appear to be the presence of an emergent medical condition, however there is always the potential for conditions to change. Please read and follow the below instructions.  Please return to the Emergency Department immediately for any new or worsening symptoms or if your symptoms do not improve within 2 days. Please drink plenty of water and get plenty of rest.  Please see your primary care provider for follow-up visit this week.  Go to the nearest Emergency Department immediately if: You have fever or chills Your pain does not go away as soon as your doctor says it should. You cannot stop vomiting. Your pain is only in areas of your belly, such as the right side or the left lower part of the belly. You have bloody or black poop, or poop that looks like tar. You have very bad pain, cramping, or bloating in your belly. You have signs of not having enough fluid or water in your body (dehydration), such as: Dark pee, very little pee, or no pee. Cracked lips. Dry mouth. Sunken eyes. Sleepiness. Weakness. You have trouble breathing or chest pain. You have any new/concerning or worsening of symptoms   Please read the additional information packets attached to your discharge summary.  Do not take your medicine if  develop an itchy rash, swelling in your mouth or lips, or difficulty breathing; call 911 and seek immediate emergency medical attention if this occurs.  You may review your lab tests and imaging results in their entirety on your MyChart account.  Please discuss all results of fully with your primary care provider and other specialist at your follow-up visit.  Note: Portions of this text may have been transcribed using voice recognition software. Every effort was made to ensure accuracy; however, inadvertent computerized transcription errors may still be present.

## 2021-01-17 NOTE — ED Provider Notes (Signed)
[facility] EMERGENCY DEPARTMENT Provider Note   CSN: [id number] Arrival date & time: 01/17/21  2048     History Chief Complaint  Patient presents with  . Abdominal Pain    Glenda Thompson is a 50 y.o. female history of obesity, diabetes, hypertension, hyperlipidemia, arthritis.   Patient presents to the ER today for nausea vomiting diarrhea and abdominal pain that began around 2 hours ago.  Patient reports that she was eating at [employer] with her husband, shortly after she finished her meal she developed abdominal pain she reports is a cramping lower abdominal pain has been constant since onset no clear aggravating or alleviating factors.  Patient reports that she had a stop at a restroom on the way home and had a small amount of nonbloody diarrhea.  She reports she continued to feel worse so she came to the emergency department.  Upon ED arrival patient reports that she had 1 episode of nonbloody/nonbilious emesis and another episode of nonbloody diarrhea.  Patient denies fever/chills, chest pain/shortness of breath, dysuria/hematuria or any additional concerns.  HPI     Past Medical History:  Diagnosis Date  . Arthritis   . Diabetes mellitus   . Hyperlipidemia   . Hypertension     Patient Active Problem List   Diagnosis Date Noted  . Nerve pain 08/28/2020  . Acute pain of right shoulder 08/28/2020  . Back pain, thoracic 03/06/2019  . Essential hypertension, benign 08/09/2018  . Annual physical exam 05/15/2013  . Vitamin D deficiency 10/13/2011  . FATIGUE 09/24/2008  . Mixed hyperlipidemia 09/06/2008  . Type 2 diabetes mellitus with vascular disease ([place]) 05/29/2008  . Morbid obesity ([place]) 05/29/2008  . Hypertension goal BP (blood pressure) < 130/80 05/29/2008    Past Surgical History:  Procedure Laterality Date  . CESAREAN SECTION       OB History   No obstetric history on file.     Family History  Problem Relation Age of Onset  . Hypertension Mother   .  Diabetes Mother   . Hyperlipidemia Mother   . Diabetes Brother     Social History   Tobacco Use  . Smoking status: Never Smoker  . Smokeless tobacco: Never Used  Vaping Use  . Vaping Use: Never used  Substance Use Topics  . Alcohol use: Never  . Drug use: Never    Home Medications Prior to Admission medications   Medication Sig Start Date End Date Taking? Authorizing Provider  amLODipine (NORVASC) 10 MG tablet TAKE 1 TABLET(10 MG) BY MOUTH DAILY 12/09/20   Fayrene Helper, MD  blood glucose meter kit and supplies Three times daily dx E11.65 Freestyle Lite 01/28/20   Fayrene Helper, MD  celecoxib (CELEBREX) 200 MG capsule TAKE 1 CAPSULE BY MOUTH TWICE A DAY 12/31/20   Carole Civil, MD  fluticasone [facility]) 50 MCG/ACT nasal spray Place 2 sprays into both nostrils daily. 03/06/19   Fayrene Helper, MD  gabapentin (NEURONTIN) 100 MG capsule Take 1 capsule (100 mg total) by mouth 2 (two) times daily. 08/28/20   Perlie Mayo, NP  glipiZIDE (GLUCOTROL XL) 10 MG 24 hr tablet Take 1 tablet (10 mg total) by mouth daily with breakfast. 12/01/20   Lindell Spar, MD  glipiZIDE (GLUCOTROL XL) 5 MG 24 hr tablet TAKE 1 TABLET BY MOUTH EVERY DAY WITH BREAKFAST 12/07/20   Fayrene Helper, MD  glucose blood (FREESTYLE LITE) test strip USE TO TEST THREE TIMES DAILY 08/30/18   Nida,  Marella Chimes, MD  Insulin Pen Needle (B-D ULTRAFINE III SHORT PEN) 31G X 8 MM MISC Use as directed to inject insulin daily. 06/28/18   Fayrene Helper, MD  Lancets (FREESTYLE) lancets USE TO OBTAIN A BLOOD SPECIMEN TO TEST BLOOD SUGAR TWICE A DAY 02/08/19   Fayrene Helper, MD  lisinopril-hydrochlorothiazide (ZESTORETIC) 20-12.5 MG tablet TAKE 2 TABLETS BY MOUTH DAILY 01/13/21   Fayrene Helper, MD  meloxicam (MOBIC) 15 MG tablet Take 15 mg by mouth daily. 04/20/20   [provider]  metFORMIN (GLUCOPHAGE-XR) 500 MG 24 hr tablet TAKE 1 TABLET BY MOUTH TWICE A DAY 11/04/20   Fayrene Helper, MD  rosuvastatin (CRESTOR) 5 MG tablet TAKE 1 TABLET BY MOUTH EVERY DAY 11/04/20   Fayrene Helper, MD  Semaglutide (RYBELSUS) 3 MG TABS Take 1 tablet by mouth daily. 12/01/20   Lindell Spar, MD  Semaglutide (RYBELSUS) 7 MG TABS Take 1 tablet by mouth daily. 12/31/20   Lindell Spar, MD  spironolactone (ALDACTONE) 50 MG tablet Take 1 tablet (50 mg total) by mouth daily. 10/21/20   Fayrene Helper, MD  terbinafine (LAMISIL) 250 MG tablet Take 1 tablet (250 mg total) by mouth daily. 06/22/20   Fayrene Helper, MD    Allergies    Metformin and related  Review of Systems   Review of Systems Ten systems are reviewed and are negative for acute change except as noted in the HPI  Physical Exam Updated Vital Signs BP 117/74 (BP Location: Left Arm)   Pulse 91   Resp 17   Ht '5\' 4"'  (1.626 m)   Wt 113.4 kg   LMP 11/02/2018   SpO2 100%   BMI 42.91 kg/m   Physical Exam Constitutional:      General: She is not in acute distress.    Appearance: Normal appearance. She is well-developed. She is not ill-appearing or diaphoretic.  HENT:     Head: Normocephalic and atraumatic.  Eyes:     General: Vision grossly intact. Gaze aligned appropriately.     Pupils: Pupils are equal, round, and reactive to light.  Neck:     Trachea: Trachea and phonation normal.  Pulmonary:     Effort: Pulmonary effort is normal. No respiratory distress.  Abdominal:     General: There is no distension.     Palpations: Abdomen is soft.     Tenderness: There is generalized abdominal tenderness and tenderness in the left upper quadrant and left lower quadrant. There is no right CVA tenderness, left CVA tenderness, guarding or rebound. Negative signs include Murphy's sign and McBurney's sign.  Musculoskeletal:        General: Normal range of motion.     Cervical back: Normal range of motion.  Skin:    General: Skin is warm and dry.  Neurological:     Mental Status: She is alert.     GCS: GCS  eye subscore is 4. GCS verbal subscore is 5. GCS motor subscore is 6.     Comments: Speech is clear and goal oriented, follows commands Major Cranial nerves without deficit, no facial droop Moves extremities without ataxia, coordination intact  Psychiatric:        Behavior: Behavior normal.     ED Results / Procedures / Treatments   Labs (all labs ordered are listed, but only abnormal results are displayed) Labs Reviewed - No data to display  EKG EKG Interpretation  Date/Time:  Sunday January 17 2021 22:00:36  EDT Ventricular Rate:  77 PR Interval:    QRS Duration: 90 QT Interval:  392 QTC Calculation: 444 R Axis:   49 Text Interpretation: Sinus rhythm Low voltage, precordial leads Borderline T abnormalities, diffuse leads since last tracing no significant change Confirmed by Noemi Chapel [phone]) on 01/17/2021 10:03:15 PM   Radiology No results found.  Procedures Procedures   Medications Ordered in ED Medications - No data to display  ED Course  I have reviewed the triage vital signs and the nursing notes.  Pertinent labs & imaging results that were available during my care of the patient were reviewed by me and considered in my medical decision making (see chart for details).    MDM Rules/Calculators/A&P                         Additional history obtained from: 1. Nursing notes from this visit. 2. Review of electronic medical records. ----------------------- I ordered, reviewed and interpreted labs which include: CBC shows leukocytosis of 12.5, mild anemia of 11.8, no thrombocytopenia. Lipase within normal limits, doubt pancreatitis. CMP shows creatinine 1.42 slightly elevated from prior at 1.13 a month ago.  BUN same as prior at 25.  No emergent electrolyte derangement, LFT elevations or gap. Urine pregnancy test is negative. - 10:45 PM: Patient reassessed, vital signs stable no acute distress.  Receiving IV fluids.  Nausea controlled following Zofran.  Reports  pain in the left side of her abdomen primarily.  No right upper quadrant tenderness or right lower quadrant tenderness on exam.  Awaiting sufficient sample for urinalysis.  Additional fluids ordered.  History and exam is suspicious gastroenteritis at this time, lower suspicion for appendicitis, cholecystitis at this time.  Patient will need additional IV fluids urinalysis and monitoring for symptom improvement.  Care handoff given to Dr. Waverly Ferrari at shift change, plan of care is to follow-up on urinalysis, reassessment after additional fluid bolus.  Final disposition per oncoming team.  Note: Portions of this report may have been transcribed using voice recognition software. Every effort was made to ensure accuracy; however, inadvertent computerized transcription errors may still be present. Final Clinical Impression(s) / ED Diagnoses Final diagnoses:  None    Rx / DC Orders ED Discharge Orders    None       Gari Crown 01/17/21 2311    Orpah Greek, MD 01/18/21 [phone]

## 2021-01-17 NOTE — ED Triage Notes (Signed)
Pt to the ED with complaints abdominal pain with N/V/D since she ate at [employer] at 1900 today.  The pt's husband ate the same thing and he is not ill.

## 2021-01-18 ENCOUNTER — Emergency Department (HOSPITAL_COMMUNITY): Payer: BC Managed Care – PPO

## 2021-01-18 DIAGNOSIS — R109 Unspecified abdominal pain: Secondary | ICD-10-CM | POA: Diagnosis not present

## 2021-01-18 DIAGNOSIS — R1110 Vomiting, unspecified: Secondary | ICD-10-CM | POA: Diagnosis not present

## 2021-01-18 LAB — URINALYSIS, ROUTINE W REFLEX MICROSCOPIC
Bacteria, UA: NONE SEEN
Bilirubin Urine: NEGATIVE
Glucose, UA: NEGATIVE mg/dL
Ketones, ur: NEGATIVE mg/dL
Leukocytes,Ua: NEGATIVE
Nitrite: NEGATIVE
Protein, ur: NEGATIVE mg/dL
Specific Gravity, Urine: 1.013 (ref 1.005–1.030)
pH: 5 (ref 5.0–8.0)

## 2021-01-18 MED ORDER — MORPHINE SULFATE (PF) 4 MG/ML IV SOLN
4.0000 mg | Freq: Once | INTRAVENOUS | Status: AC
  Administered 2021-01-18: 4 mg via INTRAVENOUS
  Filled 2021-01-18: qty 1

## 2021-01-18 MED ORDER — ONDANSETRON HCL 4 MG PO TABS: 4.0000 mg | ORAL_TABLET | Freq: Four times a day (QID) | ORAL | 0 refills | Status: DC | PRN

## 2021-01-18 MED ORDER — ONDANSETRON HCL 4 MG/2ML IJ SOLN
4.0000 mg | Freq: Once | INTRAMUSCULAR | Status: AC
  Administered 2021-01-18: 4 mg via INTRAVENOUS
  Filled 2021-01-18: qty 2

## 2021-01-18 MED ORDER — IOHEXOL 300 MG/ML  SOLN
100.0000 mL | Freq: Once | INTRAMUSCULAR | Status: AC | PRN
  Administered 2021-01-18: 100 mL via INTRAVENOUS

## 2021-01-18 NOTE — ED Notes (Signed)
Pt returned from CT Scan 

## 2021-01-19 ENCOUNTER — Encounter (INDEPENDENT_AMBULATORY_CARE_PROVIDER_SITE_OTHER): Payer: Self-pay

## 2021-01-19 ENCOUNTER — Telehealth (INDEPENDENT_AMBULATORY_CARE_PROVIDER_SITE_OTHER): Payer: Self-pay

## 2021-01-19 ENCOUNTER — Other Ambulatory Visit (INDEPENDENT_AMBULATORY_CARE_PROVIDER_SITE_OTHER): Payer: Self-pay

## 2021-01-19 DIAGNOSIS — Z1211 Encounter for screening for malignant neoplasm of colon: Secondary | ICD-10-CM

## 2021-01-19 MED ORDER — PEG 3350-KCL-NA BICARB-NACL 420 G PO SOLR: 4000.0000 mL | ORAL | 0 refills | Status: DC

## 2021-01-19 NOTE — Telephone Encounter (Signed)
Referring MD/PCP: Lodema Hong  Procedure: Tcs  Reason/Indication:  Screening  Has patient had this procedure before?  no  If so, when, by whom and where?    Is there a family history of colon cancer?  no  Who?  What age when diagnosed?    Is patient diabetic?   yes      Does patient have prosthetic heart valve or mechanical valve?  no  Do you have a pacemaker/defibrillator?  no  Has patient ever had endocarditis/atrial fibrillation? no  Have you had a stroke/heart attack last 6 mths? no  Does patient use oxygen? no  Has patient had joint replacement within last 12 months?  no  Is patient constipated or do they take laxatives? Yes, some constipation  Does patient have a history of alcohol/drug use?  no  Is patient on blood thinner such as Coumadin, Plavix and/or Aspirin? no  Do you take medicine for weight loss. no  Medications: rousuvastatin 5 mg daily, lisinopril/hctz20/2.5 mg bid, glipizide 5 mg daily, metformin 500 mg daily, spironolactone 50 mg daily, amlodipine 10 mg daily, one a day vit daily,rybelsus 3 mg daily  Allergies: nkda  Medication Adjustment per /Dr Levon Hedger hold metformin the evening prior and morning of procedure no Glipizide or Rybelsus the morning of the procedure  Procedure date & time: 01/22/21 / AM

## 2021-01-19 NOTE — Telephone Encounter (Signed)
Glenda Thompson, CMA  

## 2021-01-19 NOTE — Telephone Encounter (Signed)
Ok to schedule.  Thanks,  Zandyr Barnhill Castaneda Mayorga, MD Gastroenterology and Hepatology [facility]  

## 2021-01-20 NOTE — Patient Instructions (Signed)
Your procedure is scheduled on: 01/22/2021  Report to [facility] at  10:00   AM.  Call this number if you have problems the morning of surgery: [phone]   Remember:              Follow Directions on the letter you received from Your Physician's office regarding the Bowel Prep              No Smoking the day of Procedure :   Take these medicines the morning of surgery with A SIP OF WATER: Amlodipine              No diabetic medication morning of procedure   Do not wear jewelry, make-up or nail polish.    Do not bring valuables to the hospital.  Contacts, dentures or bridgework may not be worn into surgery.  .   Patients discharged the day of surgery will not be allowed to drive home.     Colonoscopy, Adult, Care After This sheet gives you information about how to care for yourself after your procedure. Your health care provider may also give you more specific instructions. If you have problems or questions, contact your health care provider. What can I expect after the procedure? After the procedure, it is common to have:  A small amount of blood in your stool for 24 hours after the procedure.  Some gas.  Mild abdominal cramping or bloating.  Follow these instructions at home: General instructions   For the first 24 hours after the procedure: ? Do not drive or use machinery. ? Do not sign important documents. ? Do not drink alcohol. ? Do your regular daily activities at a slower pace than normal. ? Eat soft, easy-to-digest foods. ? Rest often.  Take over-the-counter or prescription medicines only as told by your health care provider.  It is up to you to get the results of your procedure. Ask your health care provider, or the department performing the procedure, when your results will be ready. Relieving cramping and bloating  Try walking around when you have cramps or feel bloated.  Apply heat to your abdomen as told by your health care provider. Use a heat  source that your health care provider recommends, such as a moist heat pack or a heating pad. ? Place a towel between your skin and the heat source. ? Leave the heat on for 20-30 minutes. ? Remove the heat if your skin turns bright red. This is especially important if you are unable to feel pain, heat, or cold. You may have a greater risk of getting burned. Eating and drinking  Drink enough fluid to keep your urine clear or pale yellow.  Resume your normal diet as instructed by your health care provider. Avoid heavy or fried foods that are hard to digest.  Avoid drinking alcohol for as long as instructed by your health care provider. Contact a health care provider if:  You have blood in your stool 2-3 days after the procedure. Get help right away if:  You have more than a small spotting of blood in your stool.  You pass large blood clots in your stool.  Your abdomen is swollen.  You have nausea or vomiting.  You have a fever.  You have increasing abdominal pain that is not relieved with medicine. This information is not intended to replace advice given to you by your health care provider. Make sure you discuss any questions you have with your health care  provider. Document Released: 05/24/2004 Document Revised: 07/04/2016 Document Reviewed: 12/22/2015 Elsevier Interactive Patient Education  [employer].

## 2021-01-21 ENCOUNTER — Encounter: Payer: Self-pay | Admitting: Nurse Practitioner

## 2021-01-21 ENCOUNTER — Encounter (HOSPITAL_COMMUNITY)
Admission: RE | Admit: 2021-01-21 | Discharge: 2021-01-21 | Disposition: A | Discharge status: Home / Self Care | Payer: BC Managed Care – PPO | Source: Ambulatory Visit | Attending: Gastroenterology | Admitting: Gastroenterology

## 2021-01-21 ENCOUNTER — Other Ambulatory Visit (HOSPITAL_COMMUNITY)
Admission: RE | Admit: 2021-01-21 | Discharge: 2021-01-21 | Disposition: A | Discharge status: Home / Self Care | Payer: BC Managed Care – PPO | Source: Ambulatory Visit | Attending: Gastroenterology | Admitting: Gastroenterology

## 2021-01-21 ENCOUNTER — Telehealth: Payer: BC Managed Care – PPO | Admitting: Nurse Practitioner

## 2021-01-21 ENCOUNTER — Other Ambulatory Visit: Payer: Self-pay

## 2021-01-21 ENCOUNTER — Encounter (HOSPITAL_COMMUNITY): Payer: Self-pay

## 2021-01-21 DIAGNOSIS — Z79899 Other long term (current) drug therapy: Secondary | ICD-10-CM | POA: Diagnosis not present

## 2021-01-21 DIAGNOSIS — E785 Hyperlipidemia, unspecified: Secondary | ICD-10-CM | POA: Diagnosis not present

## 2021-01-21 DIAGNOSIS — Z8349 Family history of other endocrine, nutritional and metabolic diseases: Secondary | ICD-10-CM | POA: Diagnosis not present

## 2021-01-21 DIAGNOSIS — Z01812 Encounter for preprocedural laboratory examination: Secondary | ICD-10-CM | POA: Insufficient documentation

## 2021-01-21 DIAGNOSIS — R1084 Generalized abdominal pain: Secondary | ICD-10-CM

## 2021-01-21 DIAGNOSIS — Z8249 Family history of ischemic heart disease and other diseases of the circulatory system: Secondary | ICD-10-CM | POA: Diagnosis not present

## 2021-01-21 DIAGNOSIS — Z20822 Contact with and (suspected) exposure to covid-19: Secondary | ICD-10-CM | POA: Insufficient documentation

## 2021-01-21 DIAGNOSIS — R109 Unspecified abdominal pain: Secondary | ICD-10-CM | POA: Insufficient documentation

## 2021-01-21 DIAGNOSIS — Z791 Long term (current) use of non-steroidal anti-inflammatories (NSAID): Secondary | ICD-10-CM | POA: Diagnosis not present

## 2021-01-21 DIAGNOSIS — E119 Type 2 diabetes mellitus without complications: Secondary | ICD-10-CM | POA: Diagnosis not present

## 2021-01-21 DIAGNOSIS — Z833 Family history of diabetes mellitus: Secondary | ICD-10-CM | POA: Diagnosis not present

## 2021-01-21 DIAGNOSIS — Z794 Long term (current) use of insulin: Secondary | ICD-10-CM | POA: Diagnosis not present

## 2021-01-21 DIAGNOSIS — Z6841 Body Mass Index (BMI) 40.0 and over, adult: Secondary | ICD-10-CM | POA: Diagnosis not present

## 2021-01-21 DIAGNOSIS — Z1211 Encounter for screening for malignant neoplasm of colon: Secondary | ICD-10-CM | POA: Diagnosis not present

## 2021-01-21 DIAGNOSIS — K5190 Ulcerative colitis, unspecified, without complications: Secondary | ICD-10-CM | POA: Diagnosis not present

## 2021-01-21 DIAGNOSIS — I10 Essential (primary) hypertension: Secondary | ICD-10-CM | POA: Diagnosis not present

## 2021-01-21 LAB — SARS CORONAVIRUS 2 (TAT 6-24 HRS): SARS Coronavirus 2: NEGATIVE

## 2021-01-21 NOTE — Progress Notes (Signed)
Acute Office Visit  Subjective:    Patient ID: Glenda Thompson, female    DOB: September 12, 1971, 50 y.o.   MRN: [id number]  Chief Complaint  Patient presents with  . Emesis    Pt was seen in the ER for food poisoning.   . Diarrhea    Pt has been having diarrhea and blood in stools.     HPI Patient is in today for acute gastroenteritis.  She was seen in ED in 01/18/21 for " acute GI illness including vomiting and diarrhea with left-sided abdominal pain.  With reassuring labs other than very slight leukocytosis.  Patient administered antiemetics and IV fluids.  At time of recheck still experiencing a fair amount of left-sided pain, therefore provided analgesia and underwent CT scan.  No evidence of acute abnormality such as diverticulitis, findings consistent with diarrheal illness.  Discharged with symptomatic treatment. "  She will see Dr. Catalina Lunger tomorrow for colonoscopy.  Today, she states that she has abdominal pain when she eats. She states that she has cramping when she eats.  She states that she has a lot of gas.  Her pain is "in the middle of my stomach".  She has been drinking broth, [place] sandwich, chicken sandwich with fries.  With water she has some cramping and gas.  She states her rectal bleeding stopped after Tuesday.  Past Medical History:  Diagnosis Date  . Arthritis   . Diabetes mellitus   . Diabetes mellitus without complication ([place])    Phreesia 01/19/2021  . Hyperlipidemia   . Hypertension     Past Surgical History:  Procedure Laterality Date  . CESAREAN SECTION      Family History  Problem Relation Age of Onset  . Hypertension Mother   . Diabetes Mother   . Hyperlipidemia Mother   . Diabetes Brother     Social History   Socioeconomic History  . Marital status: Married    Spouse name: Not on file  . Number of children: Not on file  . Years of education: Not on file  . Highest education level: Not on file  Occupational History  . Not on file   Tobacco Use  . Smoking status: Never Smoker  . Smokeless tobacco: Never Used  Vaping Use  . Vaping Use: Never used  Substance and Sexual Activity  . Alcohol use: Never  . Drug use: Never  . Sexual activity: Not on file  Other Topics Concern  . Not on file  Social History Narrative  . Not on file   Social Determinants of Health   Financial Resource Strain: Not on file  Food Insecurity: Not on file  Transportation Needs: Not on file  Physical Activity: Not on file  Stress: Not on file  Social Connections: Not on file  Intimate Partner Violence: Not on file    Outpatient Medications Prior to Visit  Medication Sig Dispense Refill  . amLODipine (NORVASC) 10 MG tablet TAKE 1 TABLET(10 MG) BY MOUTH DAILY (Patient taking differently: Take 10 mg by mouth daily.) 90 tablet 1  . blood glucose meter kit and supplies Three times daily dx E11.65 Freestyle Lite 1 each 0  . celecoxib (CELEBREX) 200 MG capsule TAKE 1 CAPSULE BY MOUTH TWICE A DAY 60 capsule 0  . fluticasone (FLONASE) 50 MCG/ACT nasal spray Place 2 sprays into both nostrils daily. (Patient taking differently: Place 2 sprays into both nostrils daily as needed for allergies.) 16 g 12  . gabapentin (NEURONTIN) 100 MG capsule Take  1 capsule (100 mg total) by mouth 2 (two) times daily. 60 capsule 1  . glipiZIDE (GLUCOTROL XL) 10 MG 24 hr tablet Take 1 tablet (10 mg total) by mouth daily with breakfast. 90 tablet 0  . glipiZIDE (GLUCOTROL XL) 5 MG 24 hr tablet TAKE 1 TABLET BY MOUTH EVERY DAY WITH BREAKFAST (Patient taking differently: Take 5 mg by mouth daily with breakfast.) 90 tablet 1  . glucose blood (FREESTYLE LITE) test strip USE TO TEST THREE TIMES DAILY 100 each 5  . Insulin Pen Needle (B-D ULTRAFINE III SHORT PEN) 31G X 8 MM MISC Use as directed to inject insulin daily. 100 each 2  . Lancets (FREESTYLE) lancets USE TO OBTAIN A BLOOD SPECIMEN TO TEST BLOOD SUGAR TWICE A DAY 200 each 5  . lisinopril-hydrochlorothiazide  (ZESTORETIC) 20-12.5 MG tablet TAKE 2 TABLETS BY MOUTH DAILY 180 tablet 1  . metFORMIN (GLUCOPHAGE-XR) 500 MG 24 hr tablet TAKE 1 TABLET BY MOUTH TWICE A DAY (Patient taking differently: Take 500 mg by mouth 2 (two) times daily.) 180 tablet 1  . Multiple Vitamin (MULTIVITAMIN WITH MINERALS) TABS tablet Take 1 tablet by mouth daily.    . naproxen sodium (ALEVE) 220 MG tablet Take 440 mg by mouth daily as needed (pain).    . ondansetron (ZOFRAN) 4 MG tablet Take 1 tablet (4 mg total) by mouth every 6 (six) hours as needed for nausea or vomiting. 20 tablet 0  . polyethylene glycol-electrolytes (TRILYTE) 420 g solution Take 4,000 mLs by mouth as directed. 4000 mL 0  . rosuvastatin (CRESTOR) 5 MG tablet TAKE 1 TABLET BY MOUTH EVERY DAY (Patient taking differently: Take 5 mg by mouth daily.) 90 tablet 1  . Semaglutide (RYBELSUS) 3 MG TABS Take 1 tablet by mouth daily. 30 tablet 0  . Semaglutide (RYBELSUS) 7 MG TABS Take 1 tablet by mouth daily. (Patient taking differently: Take 7 mg by mouth daily.) 30 tablet 1  . spironolactone (ALDACTONE) 50 MG tablet Take 1 tablet (50 mg total) by mouth daily. 90 tablet 1  . TURMERIC PO Take 2 capsules by mouth 3 (three) times a week.     No facility-administered medications prior to visit.    No Known Allergies  Review of Systems  Constitutional: Negative.   Respiratory: Negative.   Cardiovascular: Negative.   Gastrointestinal: Positive for abdominal distention, abdominal pain and nausea. Negative for rectal pain and vomiting.       Rectal bleeding and diarrhea has resolved       Objective:    Physical Exam  LMP 11/02/2018  Wt Readings from Last 3 Encounters:  01/21/21 250 lb (113.4 kg)  01/17/21 250 lb (113.4 kg)  12/01/20 254 lb (115.2 kg)    Health Maintenance Due  Topic Date Due  . COLONOSCOPY (Pts 45-42yr Insurance coverage will need to be confirmed)  Never done  . OPHTHALMOLOGY EXAM  04/10/2020  . COVID-19 Vaccine (3 - Booster for  Moderna series) 10/11/2020    There are no preventive care reminders to display for this patient.   Lab Results  Component Value Date   TSH 2.72 06/15/2020   Lab Results  Component Value Date   WBC 12.5 (H) 01/17/2021   HGB 11.8 (L) 01/17/2021   HCT 37.3 01/17/2021   MCV 88.0 01/17/2021   PLT 296 01/17/2021   Lab Results  Component Value Date   NA 138 01/17/2021   K 3.7 01/17/2021   CO2 26 01/17/2021   GLUCOSE 137 (H) 01/17/2021  BUN 25 (H) 01/17/2021   CREATININE 1.42 (H) 01/17/2021   BILITOT 0.4 01/17/2021   ALKPHOS 44 01/17/2021   AST 20 01/17/2021   ALT 19 01/17/2021   PROT 7.6 01/17/2021   ALBUMIN 4.3 01/17/2021   CALCIUM 9.7 01/17/2021   ANIONGAP 12 01/17/2021   Lab Results  Component Value Date   CHOL 184 11/24/2020   Lab Results  Component Value Date   HDL 66 11/24/2020   Lab Results  Component Value Date   LDLCALC 99 11/24/2020   Lab Results  Component Value Date   TRIG 109 11/24/2020   Lab Results  Component Value Date   CHOLHDL 2.8 11/24/2020   Lab Results  Component Value Date   HGBA1C 9.1 (H) 11/24/2020       Assessment & Plan:   Problem List Items Addressed This Visit      Other   Abdominal pain    -had CT that was reassuring in ED on 3/27 -still having burning pain and cramping when eating -we discussed advancing diet with BRAT diet and yogurt with probiotic -she has colonoscopy tomorrow; we discussedletting GI know about her issue and she may get a culture to r/o infectious etiology -if no improvement by Monday she will will come in, and we can order stool culture          No orders of the defined types were placed in this encounter.  Date:  01/21/2021   Location of Patient: Home Location of Provider: Office Consent was obtain for visit to be over via telehealth. I verified that I am speaking with the correct person using two identifiers.  I connected with  Glenda Thompson on 01/21/21 via telephone and verified  that I am speaking with the correct person using two identifiers.   I discussed the limitations of evaluation and management by telemedicine. The patient expressed understanding and agreed to proceed.  Time spent: 15 min   Noreene Larsson, NP

## 2021-01-21 NOTE — Assessment & Plan Note (Signed)
-  had CT that was reassuring in ED on 3/27 -still having burning pain and cramping when eating -we discussed advancing diet with BRAT diet and yogurt with probiotic -she has colonoscopy tomorrow; we discussedletting GI know about her issue and she may get a culture to r/o infectious etiology -if no improvement by Monday she will will come in, and we can order stool culture

## 2021-01-22 ENCOUNTER — Ambulatory Visit (HOSPITAL_COMMUNITY): Payer: BC Managed Care – PPO | Admitting: Anesthesiology

## 2021-01-22 ENCOUNTER — Encounter (HOSPITAL_COMMUNITY)
Admission: RE | Disposition: A | Discharge status: Home / Self Care | Payer: Self-pay | Source: Ambulatory Visit | Attending: Gastroenterology

## 2021-01-22 ENCOUNTER — Telehealth: Payer: Self-pay

## 2021-01-22 ENCOUNTER — Encounter (HOSPITAL_COMMUNITY): Payer: Self-pay | Admitting: Gastroenterology

## 2021-01-22 ENCOUNTER — Ambulatory Visit (HOSPITAL_COMMUNITY)
Admission: RE | Admit: 2021-01-22 | Discharge: 2021-01-22 | Disposition: A | Discharge status: Home / Self Care | Payer: BC Managed Care – PPO | Source: Ambulatory Visit | Attending: Gastroenterology | Admitting: Gastroenterology

## 2021-01-22 DIAGNOSIS — E785 Hyperlipidemia, unspecified: Secondary | ICD-10-CM | POA: Insufficient documentation

## 2021-01-22 DIAGNOSIS — K5190 Ulcerative colitis, unspecified, without complications: Secondary | ICD-10-CM | POA: Diagnosis not present

## 2021-01-22 DIAGNOSIS — Z79899 Other long term (current) drug therapy: Secondary | ICD-10-CM | POA: Insufficient documentation

## 2021-01-22 DIAGNOSIS — K6389 Other specified diseases of intestine: Secondary | ICD-10-CM

## 2021-01-22 DIAGNOSIS — Z791 Long term (current) use of non-steroidal anti-inflammatories (NSAID): Secondary | ICD-10-CM | POA: Insufficient documentation

## 2021-01-22 DIAGNOSIS — Z833 Family history of diabetes mellitus: Secondary | ICD-10-CM | POA: Insufficient documentation

## 2021-01-22 DIAGNOSIS — Z794 Long term (current) use of insulin: Secondary | ICD-10-CM | POA: Diagnosis not present

## 2021-01-22 DIAGNOSIS — Z8349 Family history of other endocrine, nutritional and metabolic diseases: Secondary | ICD-10-CM | POA: Diagnosis not present

## 2021-01-22 DIAGNOSIS — E119 Type 2 diabetes mellitus without complications: Secondary | ICD-10-CM | POA: Insufficient documentation

## 2021-01-22 DIAGNOSIS — Z1211 Encounter for screening for malignant neoplasm of colon: Secondary | ICD-10-CM | POA: Diagnosis not present

## 2021-01-22 DIAGNOSIS — Z20822 Contact with and (suspected) exposure to covid-19: Secondary | ICD-10-CM | POA: Diagnosis not present

## 2021-01-22 DIAGNOSIS — K633 Ulcer of intestine: Secondary | ICD-10-CM | POA: Diagnosis not present

## 2021-01-22 DIAGNOSIS — Z6841 Body Mass Index (BMI) 40.0 and over, adult: Secondary | ICD-10-CM | POA: Diagnosis not present

## 2021-01-22 DIAGNOSIS — Z8249 Family history of ischemic heart disease and other diseases of the circulatory system: Secondary | ICD-10-CM | POA: Insufficient documentation

## 2021-01-22 DIAGNOSIS — I10 Essential (primary) hypertension: Secondary | ICD-10-CM | POA: Diagnosis not present

## 2021-01-22 HISTORY — PX: COLONOSCOPY: SHX174

## 2021-01-22 HISTORY — PX: BIOPSY: SHX5522

## 2021-01-22 HISTORY — PX: COLONOSCOPY WITH PROPOFOL: SHX5780

## 2021-01-22 LAB — GLUCOSE, CAPILLARY
Glucose-Capillary: 71 mg/dL (ref 70–99)
Glucose-Capillary: 126 mg/dL — ABNORMAL HIGH (ref 70–99)
Glucose-Capillary: 104 mg/dL — ABNORMAL HIGH (ref 70–99)

## 2021-01-22 LAB — HM COLONOSCOPY

## 2021-01-22 SURGERY — COLONOSCOPY WITH PROPOFOL
Anesthesia: General

## 2021-01-22 MED ORDER — STERILE WATER FOR IRRIGATION IR SOLN
Status: DC | PRN
  Administered 2021-01-22: 100 mL

## 2021-01-22 MED ORDER — PHENYLEPHRINE 40 MCG/ML (10ML) SYRINGE FOR IV PUSH (FOR BLOOD PRESSURE SUPPORT)
PREFILLED_SYRINGE | INTRAVENOUS | Status: DC | PRN
  Administered 2021-01-22 (×4): 40 ug via INTRAVENOUS

## 2021-01-22 MED ORDER — CIPROFLOXACIN HCL 500 MG PO TABS: 500.0000 mg | ORAL_TABLET | Freq: Two times a day (BID) | ORAL | 0 refills | Status: AC

## 2021-01-22 MED ORDER — DEXTROSE 50 % IV SOLN
INTRAVENOUS | Status: AC
  Administered 2021-01-22: 25 mL
  Filled 2021-01-22: qty 50

## 2021-01-22 MED ORDER — METRONIDAZOLE 500 MG PO TABS: 500.0000 mg | ORAL_TABLET | Freq: Three times a day (TID) | ORAL | 0 refills | Status: AC

## 2021-01-22 MED ORDER — KETAMINE HCL 50 MG/5ML IJ SOSY
PREFILLED_SYRINGE | INTRAMUSCULAR | Status: AC
  Filled 2021-01-22: qty 5

## 2021-01-22 MED ORDER — LACTATED RINGERS IV SOLN: INTRAVENOUS | Status: DC

## 2021-01-22 MED ORDER — LIDOCAINE HCL (CARDIAC) PF 100 MG/5ML IV SOSY
PREFILLED_SYRINGE | INTRAVENOUS | Status: DC | PRN
  Administered 2021-01-22: 50 mg via INTRATRACHEAL

## 2021-01-22 MED ORDER — PROPOFOL 500 MG/50ML IV EMUL
INTRAVENOUS | Status: DC | PRN
  Administered 2021-01-22: 150 ug/kg/min via INTRAVENOUS

## 2021-01-22 MED ORDER — PROPOFOL 10 MG/ML IV BOLUS
INTRAVENOUS | Status: DC | PRN
  Administered 2021-01-22: 30 mg via INTRAVENOUS

## 2021-01-22 MED ORDER — KETAMINE HCL 10 MG/ML IJ SOLN
INTRAMUSCULAR | Status: DC | PRN
  Administered 2021-01-22: 20 mg via INTRAVENOUS

## 2021-01-22 MED ORDER — GLYCOPYRROLATE 0.2 MG/ML IJ SOLN
INTRAMUSCULAR | Status: DC | PRN
  Administered 2021-01-22: .1 mg via INTRAVENOUS

## 2021-01-22 NOTE — Op Note (Signed)
[facility] Patient Name: Glenda Thompson Procedure Date: 01/22/2021 1:07 PM MRN: [id number] Date of Birth: 1971/03/31 Attending MD: Katrinka Blazing ,  CSN: [id number] Age: 50 Admit Type: Outpatient Procedure:                Colonoscopy Indications:              Screening for colorectal malignant neoplasm Providers:                Katrinka Blazing, Sheran Fava Pandora Leiter,                            Technician Referring MD:              Medicines:                Monitored Anesthesia Care Complications:            No immediate complications. Estimated Blood Loss:     Estimated blood loss: none. Procedure:                Pre-Anesthesia Assessment:                           - Prior to the procedure, a History and Physical                            was performed, and patient medications, allergies                            and sensitivities were reviewed. The patient's                            tolerance of previous anesthesia was reviewed.                           - The risks and benefits of the procedure and the                            sedation options and risks were discussed with the                            patient. All questions were answered and informed                            consent was obtained.                           - ASA Grade Assessment: II - A patient with mild                            systemic disease.                           After obtaining informed consent, the colonoscope                            was passed under direct vision. Throughout the  procedure, the patient's blood pressure, pulse, and                            oxygen saturations were monitored continuously. The                            [id number]-[id number] ([id number]) scope was introduced through                            the anus and advanced to the the cecum, identified                            by appendiceal orifice and ileocecal valve. The                             colonoscopy was performed without difficulty. The                            patient tolerated the procedure well. Scope In: 1:16:34 PM Scope Out: 1:42:39 PM Scope Withdrawal Time: 0 hours 17 minutes 24 seconds  Total Procedure Duration: 0 hours 26 minutes 5 seconds  Findings:      The perianal and digital rectal examinations were normal.      A lineal area of severely altered vascular in the antimesenteric region       characterized by congestion, erythema and ulceration was found from 45       to 70 cm proximal to the anus. Possible ischemic changes? Biopsies were       taken with a cold forceps for histology, check CMV and HSV.      The retroflexed view of the distal rectum and anal verge was normal and       showed no anal or rectal abnormalities. Impression:               - Altered vascular, congested, erythematous and                            ulcerated mucosa from 50 to 70 cm proximal to the                            anus. Possible ischemic colitis. Biopsied.                           - The distal rectum and anal verge are normal on                            retroflexion view. Moderate Sedation:      Per Anesthesia Care Recommendation:           - Discharge patient to home (ambulatory).                           - Resume previous diet.                           - Await pathology results.                           -  Repeat colonoscopy in 10 years for screening                            purposes.                           - Take ciprofloxacin an Flagyl and ciprofloxacin                            for 7 days.                           - Stop using NSAIDs such as Aleve, ibuprofen,                            naproxen, Motrin, Voltaren or Advil (even the                            topical ones) Procedure Code(s):        --- Professional ---                           [phone], Colonoscopy, flexible; with biopsy, single                            or  multiple Diagnosis Code(s):        --- Professional ---                           Z12.11, Encounter for screening for malignant                            neoplasm of colon                           K63.89, Other specified diseases of intestine                           K63.3, Ulcer of intestine CPT copyright 2019 American Medical Association. All rights reserved. The codes documented in this report are preliminary and upon coder review may  be revised to meet current compliance requirements. Katrinka Blazing, MD Katrinka Blazing,  01/22/2021 2:04:44 PM This report has been signed electronically. Number of Addenda: 0

## 2021-01-22 NOTE — Anesthesia Postprocedure Evaluation (Signed)
Anesthesia Post Note  Patient: Glenda Thompson  Procedure(s) Performed: COLONOSCOPY WITH PROPOFOL (N/A ) BIOPSY  Patient location during evaluation: Phase II Anesthesia Type: General Level of consciousness: awake Pain management: pain level controlled Vital Signs Assessment: post-procedure vital signs reviewed and stable Respiratory status: spontaneous breathing Cardiovascular status: stable Postop Assessment: no apparent nausea or vomiting Anesthetic complications: no   No complications documented.   Last Vitals:  Vitals:   01/22/21 1227 01/22/21 1230  BP: 121/60   Pulse: 88 88  Resp: 16 14  Temp: 37.1 C   SpO2: 100% 100%    Last Pain:  Vitals:   01/22/21 1314  PainSc: 0-No pain                 Dyquan Minks Hristova

## 2021-01-22 NOTE — Anesthesia Preprocedure Evaluation (Signed)
Anesthesia Evaluation  Patient identified by MRN, date of birth, ID band Patient awake    Reviewed: Allergy & Precautions, NPO status , Patient's Chart, lab work & pertinent test results  History of Anesthesia Complications Negative for: history of anesthetic complications  Airway Mallampati: II  TM Distance: >3 FB Neck ROM: Full    Dental  (+) Dental Advisory Given, Edentulous Upper, Edentulous Lower   Pulmonary neg pulmonary ROS,    Pulmonary exam normal breath sounds clear to auscultation       Cardiovascular Exercise Tolerance: Good hypertension, Pt. on medications Normal cardiovascular exam Rhythm:Regular Rate:Normal     Neuro/Psych negative neurological ROS  negative psych ROS   GI/Hepatic negative GI ROS, Neg liver ROS,   Endo/Other  diabetes, Well Controlled, Type 2, Oral Hypoglycemic Agents  Renal/GU      Musculoskeletal  (+) Arthritis ,   Abdominal   Peds  Hematology   Anesthesia Other Findings   Reproductive/Obstetrics                             Anesthesia Physical Anesthesia Plan  ASA: III  Anesthesia Plan: General   Post-op Pain Management:    Induction: Intravenous  PONV Risk Score and Plan: Propofol infusion  Airway Management Planned: Nasal Cannula and Natural Airway  Additional Equipment:   Intra-op Plan:   Post-operative Plan:   Informed Consent: I have reviewed the patients History and Physical, chart, labs and discussed the procedure including the risks, benefits and alternatives for the proposed anesthesia with the patient or authorized representative who has indicated his/her understanding and acceptance.       Plan Discussed with: CRNA and Surgeon  Anesthesia Plan Comments:         Anesthesia Quick Evaluation

## 2021-01-22 NOTE — Telephone Encounter (Signed)
FMLA   COPIED NOTED SLEEVED 

## 2021-01-22 NOTE — Transfer of Care (Signed)
Immediate Anesthesia Transfer of Care Note  Patient: Glenda Thompson  Procedure(s) Performed: COLONOSCOPY WITH PROPOFOL (N/A ) BIOPSY  Patient Location: PACU  Anesthesia Type:General  Level of Consciousness: awake  Airway & Oxygen Therapy: Patient Spontanous Breathing  Post-op Assessment: Report given to RN and Post -op Vital signs reviewed and stable  Post vital signs: Reviewed and stable  Last Vitals:  Vitals Value Taken Time  BP    Temp    Pulse    Resp    SpO2      Last Pain:  Vitals:   01/22/21 1314  PainSc: 0-No pain         Complications: No complications documented.

## 2021-01-22 NOTE — Discharge Instructions (Signed)
You are being discharged to home.  Resume your previous diet.  We are waiting for your pathology results.  Your physician has recommended a repeat colonoscopy in 10 years for screening purposes. Take ciprofloxacin an Flagyl and ciprofloxacin for 7 days.  Stop using NSAIDs such as Aleve, ibuprofen, naproxen, Motrin, Voltaren or Advil (even the topical ones)   Monitored Anesthesia Care, Care After This sheet gives you information about how to care for yourself after your procedure. Your health care provider may also give you more specific instructions. If you have problems or questions, contact your health care provider. What can I expect after the procedure? After the procedure, it is common to have:  Tiredness.  Forgetfulness about what happened after the procedure.  Impaired judgment for important decisions.  Nausea or vomiting.  Some difficulty with balance. Follow these instructions at home: For the time period you were told by your health care provider:  Rest as needed.  Do not participate in activities where you could fall or become injured.  Do not drive or use machinery.  Do not drink alcohol.  Do not take sleeping pills or medicines that cause drowsiness.  Do not make important decisions or sign legal documents.  Do not take care of children on your own.      Eating and drinking  Follow the diet that is recommended by your health care provider.  Drink enough fluid to keep your urine pale yellow.  If you vomit: ? Drink water, juice, or soup when you can drink without vomiting. ? Make sure you have little or no nausea before eating solid foods. General instructions  Have a responsible adult stay with you for the time you are told. It is important to have someone help care for you until you are awake and alert.  Take over-the-counter and prescription medicines only as told by your health care provider.  If you have sleep apnea, surgery and certain medicines  can increase your risk for breathing problems. Follow instructions from your health care provider about wearing your sleep device: ? Anytime you are sleeping, including during daytime naps. ? While taking prescription pain medicines, sleeping medicines, or medicines that make you drowsy.  Avoid smoking.  Keep all follow-up visits as told by your health care provider. This is important. Contact a health care provider if:  You keep feeling nauseous or you keep vomiting.  You feel light-headed.  You are still sleepy or having trouble with balance after 24 hours.  You develop a rash.  You have a fever.  You have redness or swelling around the IV site. Get help right away if:  You have trouble breathing.  You have new-onset confusion at home. Summary  For several hours after your procedure, you may feel tired. You may also be forgetful and have poor judgment.  Have a responsible adult stay with you for the time you are told. It is important to have someone help care for you until you are awake and alert.  Rest as told. Do not drive or operate machinery. Do not drink alcohol or take sleeping pills.  Get help right away if you have trouble breathing, or if you suddenly become confused. This information is not intended to replace advice given to you by your health care provider. Make sure you discuss any questions you have with your health care provider. Document Revised: 06/25/2020 Document Reviewed: 09/12/2019 Elsevier Patient Education  2021 [employer] Inc[employer]    Colonoscopy, Adult, Care After This sheet  gives you information about how to care for yourself after your procedure. Your doctor may also give you more specific instructions. If you have problems or questions, call your doctor. What can I expect after the procedure? After the procedure, it is common to have:  A small amount of blood in your poop (stool) for 24 hours.  Some gas.  Mild cramping or bloating in your belly  (abdomen). Follow these instructions at home: Eating and drinking  Drink enough fluid to keep your pee (urine) pale yellow.  Follow instructions from your doctor about what you cannot eat or drink.  Return to your normal diet as told by your doctor. Avoid heavy or fried foods that are hard to digest.   Activity  Rest as told by your doctor.  Do not sit for a long time without moving. Get up to take short walks every 1-2 hours. This is important. Ask for help if you feel weak or unsteady.  Return to your normal activities as told by your doctor. Ask your doctor what activities are safe for you. To help cramping and bloating:  Try walking around.  Put heat on your belly as told by your doctor. Use the heat source that your doctor recommends, such as a moist heat pack or a heating pad. ? Put a towel between your skin and the heat source. ? Leave the heat on for 20-30 minutes. ? Remove the heat if your skin turns bright red. This is very important if you are unable to feel pain, heat, or cold. You may have a greater risk of getting burned.   General instructions  If you were given a medicine to help you relax (sedative) during your procedure, it can affect you for many hours. Do not drive or use machinery until your doctor says that it is safe.  For the first 24 hours after the procedure: ? Do not sign important documents. ? Do not drink alcohol. ? Do your daily activities more slowly than normal. ? Eat foods that are soft and easy to digest.  Take over-the-counter or prescription medicines only as told by your doctor.  Keep all follow-up visits as told by your doctor. This is important. Contact a doctor if:  You have blood in your poop 2-3 days after the procedure. Get help right away if:  You have more than a small amount of blood in your poop.  You see large clumps of tissue (blood clots) in your poop.  Your belly is swollen.  You feel like you may vomit  (nauseous).  You vomit.  You have a fever.  You have belly pain that gets worse, and medicine does not help your pain. Summary  After the procedure, it is common to have a small amount of blood in your poop. You may also have mild cramping and bloating in your belly.  If you were given a medicine to help you relax (sedative) during your procedure, it can affect you for many hours. Do not drive or use machinery until your doctor says that it is safe.  Get help right away if you have a lot of blood in your poop, feel like you may vomit, have a fever, or have more belly pain. This information is not intended to replace advice given to you by your health care provider. Make sure you discuss any questions you have with your health care provider. Document Revised: 08/16/2019 Document Reviewed: 05/06/2019 Elsevier Patient Education  2021 [employer].

## 2021-01-22 NOTE — H&P (Addendum)
Glenda Thompson is an 50 y.o. female.   Chief Complaint: screening colonoscopy HPI: 50 y/o f with PMH DM, HLD, HTN, obesity, coming for screening colonoscopy. The patient has never had a colonoscopy in the past.  The patient denies having any complaints such as melena, hematochezia, abdominal pain or distention, change in her bowel movement consistency or frequency, no changes in her weight recently.  No family history of colorectal cancer.    Past Medical History:  Diagnosis Date  . Arthritis   . Diabetes mellitus   . Diabetes mellitus without complication ([place])    Phreesia 01/19/2021  . Hyperlipidemia   . Hypertension     Past Surgical History:  Procedure Laterality Date  . CESAREAN SECTION      Family History  Problem Relation Age of Onset  . Hypertension Mother   . Diabetes Mother   . Hyperlipidemia Mother   . Diabetes Brother    Social History:  reports that she has never smoked. She has never used smokeless tobacco. She reports that she does not drink alcohol and does not use drugs.  Allergies: No Known Allergies  Medications Prior to Admission  Medication Sig Dispense Refill  . amLODipine (NORVASC) 10 MG tablet TAKE 1 TABLET(10 MG) BY MOUTH DAILY (Patient taking differently: Take 10 mg by mouth daily.) 90 tablet 1  . glipiZIDE (GLUCOTROL XL) 5 MG 24 hr tablet TAKE 1 TABLET BY MOUTH EVERY DAY WITH BREAKFAST (Patient taking differently: Take 5 mg by mouth daily with breakfast.) 90 tablet 1  . lisinopril-hydrochlorothiazide (ZESTORETIC) 20-12.5 MG tablet TAKE 2 TABLETS BY MOUTH DAILY 180 tablet 1  . metFORMIN (GLUCOPHAGE-XR) 500 MG 24 hr tablet TAKE 1 TABLET BY MOUTH TWICE A DAY (Patient taking differently: Take 500 mg by mouth 2 (two) times daily.) 180 tablet 1  . Multiple Vitamin (MULTIVITAMIN WITH MINERALS) TABS tablet Take 1 tablet by mouth daily.    . naproxen sodium (ALEVE) 220 MG tablet Take 440 mg by mouth daily as needed (pain).    . polyethylene  glycol-electrolytes (TRILYTE) 420 g solution Take 4,000 mLs by mouth as directed. 4000 mL 0  . rosuvastatin (CRESTOR) 5 MG tablet TAKE 1 TABLET BY MOUTH EVERY DAY (Patient taking differently: Take 5 mg by mouth daily.) 90 tablet 1  . Semaglutide (RYBELSUS) 3 MG TABS Take 1 tablet by mouth daily. 30 tablet 0  . Semaglutide (RYBELSUS) 7 MG TABS Take 1 tablet by mouth daily. (Patient taking differently: Take 7 mg by mouth daily.) 30 tablet 1  . spironolactone (ALDACTONE) 50 MG tablet Take 1 tablet (50 mg total) by mouth daily. 90 tablet 1  . TURMERIC PO Take 2 capsules by mouth 3 (three) times a week.    . blood glucose meter kit and supplies Three times daily dx E11.65 Freestyle Lite 1 each 0  . celecoxib (CELEBREX) 200 MG capsule TAKE 1 CAPSULE BY MOUTH TWICE A DAY 60 capsule 0  . fluticasone (FLONASE) 50 MCG/ACT nasal spray Place 2 sprays into both nostrils daily. (Patient taking differently: Place 2 sprays into both nostrils daily as needed for allergies.) 16 g 12  . gabapentin (NEURONTIN) 100 MG capsule Take 1 capsule (100 mg total) by mouth 2 (two) times daily. 60 capsule 1  . glipiZIDE (GLUCOTROL XL) 10 MG 24 hr tablet Take 1 tablet (10 mg total) by mouth daily with breakfast. 90 tablet 0  . glucose blood (FREESTYLE LITE) test strip USE TO TEST THREE TIMES DAILY 100 each 5  .  Insulin Pen Needle (B-D ULTRAFINE III SHORT PEN) 31G X 8 MM MISC Use as directed to inject insulin daily. 100 each 2  . Lancets (FREESTYLE) lancets USE TO OBTAIN A BLOOD SPECIMEN TO TEST BLOOD SUGAR TWICE A DAY 200 each 5  . ondansetron (ZOFRAN) 4 MG tablet Take 1 tablet (4 mg total) by mouth every 6 (six) hours as needed for nausea or vomiting. 20 tablet 0    Results for orders placed or performed during the hospital encounter of 01/22/21 (from the past 48 hour(s))  Glucose, capillary     Status: None   Collection Time: 01/22/21 12:17 PM  Result Value Ref Range   Glucose-Capillary 71 70 - 99 mg/dL    Comment: Glucose  reference range applies only to samples taken after fasting for at least 8 hours.   No results found.  Review of Systems  Constitutional: Negative.   HENT: Negative.   Eyes: Negative.   Respiratory: Negative.   Cardiovascular: Negative.   Gastrointestinal: Negative.   Endocrine: Negative.   Genitourinary: Negative.   Musculoskeletal: Negative.   Skin: Negative.   Allergic/Immunologic: Negative.   Neurological: Negative.   Hematological: Negative.   Psychiatric/Behavioral: Negative.     Blood pressure 121/60, pulse 88, temperature 98.8 F (37.1 C), resp. rate 16, last menstrual period 11/02/2018, SpO2 100 %. Physical Exam  GENERAL: The patient is AO x3, in no acute distress. HEENT: Head is normocephalic and atraumatic. EOMI are intact. Mouth is well hydrated and without lesions. Obese.  NECK: Supple. No masses LUNGS: Clear to auscultation. No presence of rhonchi/wheezing/rales. Adequate chest expansion HEART: RRR, normal s1 and s2. ABDOMEN: Soft, nontender, no guarding, no peritoneal signs, and nondistended. BS +. No masses. EXTREMITIES: Without any cyanosis, clubbing, rash, lesions or edema. NEUROLOGIC: AOx3, no focal motor deficit. SKIN: no jaundice, no rashes  Assessment/Plan 50 y/o f with PMH DM, HLD, HTN, obesity, coming for screening colonoscopy. The patient has never had a colonoscopy in the past.  The patient is at average risk for colorectal cancer.  We will proceed with colonoscopy today.  Harvel Quale, MD 01/22/2021, 12:37 PM

## 2021-01-25 ENCOUNTER — Encounter: Payer: Self-pay | Admitting: Family Medicine

## 2021-01-25 ENCOUNTER — Telehealth (INDEPENDENT_AMBULATORY_CARE_PROVIDER_SITE_OTHER): Payer: BC Managed Care – PPO | Admitting: Internal Medicine

## 2021-01-25 ENCOUNTER — Encounter: Payer: Self-pay | Admitting: Internal Medicine

## 2021-01-25 ENCOUNTER — Encounter (INDEPENDENT_AMBULATORY_CARE_PROVIDER_SITE_OTHER): Payer: Self-pay | Admitting: *Deleted

## 2021-01-25 DIAGNOSIS — K625 Hemorrhage of anus and rectum: Secondary | ICD-10-CM | POA: Diagnosis not present

## 2021-01-25 DIAGNOSIS — Z9889 Other specified postprocedural states: Secondary | ICD-10-CM | POA: Diagnosis not present

## 2021-01-25 LAB — SURGICAL PATHOLOGY

## 2021-01-25 NOTE — Telephone Encounter (Signed)
Pt made a phone appt with patel 01-25-21

## 2021-01-25 NOTE — Patient Instructions (Signed)
Please get blood tests done at our office.  If you have frank bleeding or dark stool, please go to ER immediately.  Please continue to take antibiotics for now.

## 2021-01-25 NOTE — Progress Notes (Signed)
Virtual Visit via Telephone Note   This visit type was conducted due to national recommendations for restrictions regarding the COVID-19 Pandemic (e.g. social distancing) in an effort to limit this patient's exposure and mitigate transmission in our community.  Due to her co-morbid illnesses, this patient is at least at moderate risk for complications without adequate follow up.  This format is felt to be most appropriate for this patient at this time.  The patient did not have access to video technology/had technical difficulties with video requiring transitioning to audio format only (telephone).  All issues noted in this document were discussed and addressed.  No physical exam could be performed with this format.  Evaluation Performed:  Follow-up visit  Date:  01/25/2021   ID:  FINLEIGH CHEONG, DOB 1971/03/19, MRN [id number]  Patient Location: Home Provider Location: Office/Clinic  Participants: Patient Location of Patient: Home Location of Provider: Telehealth Consent was obtain for visit to be over via telehealth. I verified that I am speaking with the correct person using two identifiers.  PCP:  Fayrene Helper, MD   Chief Complaint:  Bleeding on the toile paper  History of Present Illness:    Glenda Thompson is a 50 y.o. female who has a televisit for c/o bleeding after BM that she noticed on toilet paper. She has been having diarrhea for last few days and was given Ciprofloxacin and Flagyl on the day of colonoscopy - 04/01. She states that Flagyl makes her nauseous. She denies any melena or hematochezia. Denies any fever, chills, or vomiting. She has been feeling fatigued as well.  The patient does not have symptoms concerning for COVID-19 infection (fever, chills, cough, or new shortness of breath).   Past Medical, Surgical, Social History, Allergies, and Medications have been Reviewed.  Past Medical History:  Diagnosis Date  . Arthritis   . Diabetes mellitus   .  Diabetes mellitus without complication ([place])    Phreesia 01/19/2021  . Hyperlipidemia   . Hypertension    Past Surgical History:  Procedure Laterality Date  . CESAREAN SECTION       Current Meds  Medication Sig  . amLODipine (NORVASC) 10 MG tablet TAKE 1 TABLET(10 MG) BY MOUTH DAILY (Patient taking differently: Take 10 mg by mouth daily.)  . blood glucose meter kit and supplies Three times daily dx E11.65 Freestyle Lite  . ciprofloxacin (CIPRO) 500 MG tablet Take 1 tablet (500 mg total) by mouth 2 (two) times daily for 7 days.  . fluticasone (FLONASE) 50 MCG/ACT nasal spray Place 2 sprays into both nostrils daily. (Patient taking differently: Place 2 sprays into both nostrils daily as needed for allergies.)  . gabapentin (NEURONTIN) 100 MG capsule Take 1 capsule (100 mg total) by mouth 2 (two) times daily.  Marland Kitchen glipiZIDE (GLUCOTROL XL) 5 MG 24 hr tablet TAKE 1 TABLET BY MOUTH EVERY DAY WITH BREAKFAST (Patient taking differently: Take 5 mg by mouth daily with breakfast.)  . glucose blood (FREESTYLE LITE) test strip USE TO TEST THREE TIMES DAILY  . Insulin Pen Needle (B-D ULTRAFINE III SHORT PEN) 31G X 8 MM MISC Use as directed to inject insulin daily.  . Lancets (FREESTYLE) lancets USE TO OBTAIN A BLOOD SPECIMEN TO TEST BLOOD SUGAR TWICE A DAY  . lisinopril-hydrochlorothiazide (ZESTORETIC) 20-12.5 MG tablet TAKE 2 TABLETS BY MOUTH DAILY  . metFORMIN (GLUCOPHAGE-XR) 500 MG 24 hr tablet TAKE 1 TABLET BY MOUTH TWICE A DAY (Patient taking differently: Take 500 mg by mouth  2 (two) times daily.)  . metroNIDAZOLE (FLAGYL) 500 MG tablet Take 1 tablet (500 mg total) by mouth 3 (three) times daily for 7 days.  . Multiple Vitamin (MULTIVITAMIN WITH MINERALS) TABS tablet Take 1 tablet by mouth daily.  . ondansetron (ZOFRAN) 4 MG tablet Take 1 tablet (4 mg total) by mouth every 6 (six) hours as needed for nausea or vomiting.  . rosuvastatin (CRESTOR) 5 MG tablet TAKE 1 TABLET BY MOUTH EVERY DAY (Patient  taking differently: Take 5 mg by mouth daily.)  . Semaglutide (RYBELSUS) 3 MG TABS Take 1 tablet by mouth daily.  . Semaglutide (RYBELSUS) 7 MG TABS Take 1 tablet by mouth daily. (Patient taking differently: Take 7 mg by mouth daily.)  . spironolactone (ALDACTONE) 50 MG tablet Take 1 tablet (50 mg total) by mouth daily.  . TURMERIC PO Take 2 capsules by mouth 3 (three) times a week.     Allergies:   Patient has no known allergies.   ROS:   Please see the history of present illness.     All other systems reviewed and are negative.   Labs/Other Tests and Data Reviewed:    Recent Labs: 06/15/2020: TSH 2.72 01/17/2021: ALT 19; BUN 25; Creatinine, Ser 1.42; Hemoglobin 11.8; Platelets 296; Potassium 3.7; Sodium 138   Recent Lipid Panel Lab Results  Component Value Date/Time   CHOL 184 11/24/2020 09:35 AM   TRIG 109 11/24/2020 09:35 AM   HDL 66 11/24/2020 09:35 AM   CHOLHDL 2.8 11/24/2020 09:35 AM   CHOLHDL 1.9 06/15/2020 10:19 AM   LDLCALC 99 11/24/2020 09:35 AM   LDLCALC 63 06/15/2020 10:19 AM    Wt Readings from Last 3 Encounters:  01/21/21 250 lb (113.4 kg)  01/17/21 250 lb (113.4 kg)  12/01/20 254 lb (115.2 kg)      ASSESSMENT & PLAN:    Rectal bleeding Blood on tissue paper could be anal fissure, no frank bleeding or melena Colonoscopy showed signs of inflammation and possible ischemic colitis On Cipro and Flagyl Will check CBC and BMP If persistent, needs to be evaluated in ER  Time:   Today, I have spent 13 minutes reviewing the chart, including problem list, medications, and with the patient with telehealth technology discussing the above problems.   Medication Adjustments/Labs and Tests Ordered: Current medicines are reviewed at length with the patient today.  Concerns regarding medicines are outlined above.   Tests Ordered: No orders of the defined types were placed in this encounter.   Medication Changes: No orders of the defined types were placed in  this encounter.    Note: This dictation was prepared with Dragon dictation along with smaller phrase technology. Similar sounding words can be transcribed inadequately or may not be corrected upon review. Any transcriptional errors that result from this process are unintentional.      Disposition:  Follow up  Signed, Lindell Spar, MD  01/25/2021 3:52 PM     [facility]

## 2021-01-26 ENCOUNTER — Encounter: Payer: Self-pay | Admitting: Family Medicine

## 2021-01-26 ENCOUNTER — Emergency Department (HOSPITAL_COMMUNITY)
Admission: EM | Admit: 2021-01-26 | Discharge: 2021-01-26 | Disposition: A | Discharge status: Home / Self Care | Payer: BC Managed Care – PPO | Attending: Emergency Medicine | Admitting: Emergency Medicine

## 2021-01-26 ENCOUNTER — Other Ambulatory Visit: Payer: Self-pay

## 2021-01-26 ENCOUNTER — Encounter (HOSPITAL_COMMUNITY): Payer: Self-pay | Admitting: Emergency Medicine

## 2021-01-26 DIAGNOSIS — Z7984 Long term (current) use of oral hypoglycemic drugs: Secondary | ICD-10-CM | POA: Diagnosis not present

## 2021-01-26 DIAGNOSIS — R5383 Other fatigue: Secondary | ICD-10-CM | POA: Insufficient documentation

## 2021-01-26 DIAGNOSIS — E860 Dehydration: Secondary | ICD-10-CM | POA: Diagnosis not present

## 2021-01-26 DIAGNOSIS — R7989 Other specified abnormal findings of blood chemistry: Secondary | ICD-10-CM | POA: Diagnosis not present

## 2021-01-26 DIAGNOSIS — Z794 Long term (current) use of insulin: Secondary | ICD-10-CM | POA: Diagnosis not present

## 2021-01-26 DIAGNOSIS — I10 Essential (primary) hypertension: Secondary | ICD-10-CM | POA: Diagnosis not present

## 2021-01-26 DIAGNOSIS — Z79899 Other long term (current) drug therapy: Secondary | ICD-10-CM | POA: Diagnosis not present

## 2021-01-26 DIAGNOSIS — R197 Diarrhea, unspecified: Secondary | ICD-10-CM | POA: Insufficient documentation

## 2021-01-26 DIAGNOSIS — E119 Type 2 diabetes mellitus without complications: Secondary | ICD-10-CM | POA: Diagnosis not present

## 2021-01-26 DIAGNOSIS — R531 Weakness: Secondary | ICD-10-CM | POA: Diagnosis not present

## 2021-01-26 DIAGNOSIS — Z20822 Contact with and (suspected) exposure to covid-19: Secondary | ICD-10-CM | POA: Diagnosis not present

## 2021-01-26 DIAGNOSIS — N179 Acute kidney failure, unspecified: Secondary | ICD-10-CM | POA: Diagnosis not present

## 2021-01-26 LAB — URINALYSIS, ROUTINE W REFLEX MICROSCOPIC
Bacteria, UA: NONE SEEN
Bilirubin Urine: NEGATIVE
Glucose, UA: NEGATIVE mg/dL
Ketones, ur: NEGATIVE mg/dL
Leukocytes,Ua: NEGATIVE
Nitrite: NEGATIVE
Protein, ur: NEGATIVE mg/dL
Specific Gravity, Urine: 1.005 (ref 1.005–1.030)
pH: 6 (ref 5.0–8.0)

## 2021-01-26 LAB — CBC WITH DIFFERENTIAL/PLATELET
Abs Immature Granulocytes: 0.06 10*3/uL (ref 0.00–0.07)
Basophils Absolute: 0 10*3/uL (ref 0.0–0.1)
Basophils Relative: 0 %
Eosinophils Absolute: 0.2 10*3/uL (ref 0.0–0.5)
Eosinophils Relative: 2 %
HCT: 34.5 % — ABNORMAL LOW (ref 36.0–46.0)
Hemoglobin: 11.1 g/dL — ABNORMAL LOW (ref 12.0–15.0)
Immature Granulocytes: 1 %
Lymphocytes Relative: 29 %
Lymphs Abs: 2.6 10*3/uL (ref 0.7–4.0)
MCH: 28.1 pg (ref 26.0–34.0)
MCHC: 32.2 g/dL (ref 30.0–36.0)
MCV: 87.3 fL (ref 80.0–100.0)
Monocytes Absolute: 0.6 10*3/uL (ref 0.1–1.0)
Monocytes Relative: 7 %
Neutro Abs: 5.6 10*3/uL (ref 1.7–7.7)
Neutrophils Relative %: 61 %
Platelets: 322 10*3/uL (ref 150–400)
RBC: 3.95 MIL/uL (ref 3.87–5.11)
RDW: 12.9 % (ref 11.5–15.5)
WBC: 9.1 10*3/uL (ref 4.0–10.5)
nRBC: 0 % (ref 0.0–0.2)

## 2021-01-26 LAB — BASIC METABOLIC PANEL
BUN/Creatinine Ratio: 10 (ref 9–23)
BUN: 26 mg/dL — ABNORMAL HIGH (ref 6–24)
CO2: 20 mmol/L (ref 20–29)
Calcium: 10 mg/dL (ref 8.7–10.2)
Chloride: 101 mmol/L (ref 96–106)
Creatinine, Ser: 2.48 mg/dL — ABNORMAL HIGH (ref 0.57–1.00)
Glucose: 77 mg/dL (ref 65–99)
Potassium: 5 mmol/L (ref 3.5–5.2)
Sodium: 141 mmol/L (ref 134–144)
eGFR: 23 mL/min/{1.73_m2} — ABNORMAL LOW (ref >59)

## 2021-01-26 LAB — CBC
Hematocrit: 35 % (ref 34.0–46.6)
Hemoglobin: 11.6 g/dL (ref 11.1–15.9)
MCH: 27.6 pg (ref 26.6–33.0)
MCHC: 33.1 g/dL (ref 31.5–35.7)
MCV: 83 fL (ref 79–97)
Platelets: 371 10*3/uL (ref 150–450)
RBC: 4.21 x10E6/uL (ref 3.77–5.28)
RDW: 12.9 % (ref 11.7–15.4)
WBC: 10.8 10*3/uL (ref 3.4–10.8)

## 2021-01-26 LAB — RESP PANEL BY RT-PCR (FLU A&B, COVID) ARPGX2
Influenza A by PCR: NEGATIVE
Influenza B by PCR: NEGATIVE
SARS Coronavirus 2 by RT PCR: NEGATIVE

## 2021-01-26 LAB — COMPREHENSIVE METABOLIC PANEL
ALT: 23 U/L (ref 0–44)
AST: 27 U/L (ref 15–41)
Albumin: 4.4 g/dL (ref 3.5–5.0)
Alkaline Phosphatase: 40 U/L (ref 38–126)
Anion gap: 10 (ref 5–15)
BUN: 27 mg/dL — ABNORMAL HIGH (ref 6–20)
CO2: 22 mmol/L (ref 22–32)
Calcium: 9.4 mg/dL (ref 8.9–10.3)
Chloride: 104 mmol/L (ref 98–111)
Creatinine, Ser: 1.92 mg/dL — ABNORMAL HIGH (ref 0.44–1.00)
GFR, Estimated: 31 mL/min — ABNORMAL LOW (ref >60)
Glucose, Bld: 86 mg/dL (ref 70–99)
Potassium: 4.5 mmol/L (ref 3.5–5.1)
Sodium: 136 mmol/L (ref 135–145)
Total Bilirubin: 0.5 mg/dL (ref 0.3–1.2)
Total Protein: 7.4 g/dL (ref 6.5–8.1)

## 2021-01-26 LAB — CREATININE, SERUM
Creatinine, Ser: 1.67 mg/dL — ABNORMAL HIGH (ref 0.44–1.00)
GFR, Estimated: 37 mL/min — ABNORMAL LOW (ref >60)

## 2021-01-26 LAB — MAGNESIUM: Magnesium: 2.1 mg/dL (ref 1.7–2.4)

## 2021-01-26 LAB — LIPASE, BLOOD: Lipase: 31 U/L (ref 11–51)

## 2021-01-26 MED ORDER — SODIUM CHLORIDE 0.9 % IV BOLUS
2000.0000 mL | Freq: Once | INTRAVENOUS | Status: AC
  Administered 2021-01-26: 2000 mL via INTRAVENOUS

## 2021-01-26 MED ORDER — SODIUM CHLORIDE 0.9 % IV BOLUS
1000.0000 mL | Freq: Once | INTRAVENOUS | Status: AC
  Administered 2021-01-26: 1000 mL via INTRAVENOUS

## 2021-01-26 NOTE — Telephone Encounter (Signed)
Pt advised to go to ER per result note by dr patel

## 2021-01-26 NOTE — ED Provider Notes (Signed)
  Patient signed out to me by Arthor Captain, PA-C at end of shift pending completion of work-up and reevaluation.   Patient is a 50 year old female sent here by PCP for evaluation of elevated serum creatinine level and history of diarrhea.  She was found to have AKI.  She was seen here previously and also had colonoscopy 4 days ago.  She was started on antibiotics for likely colitis.  She has been given 2 L of IV fluids.  She has ambulated to the restroom without difficulty.  No dysuria symptoms.  No further diarrhea.  On my exam, patient is resting comfortably, nontoxic appearing.  Her abdomen is soft without guarding or rebound tenderness no palpable masses.  Her serum creatinine from yesterday was 2.48, and 1.92 from earlier today.  Baseline creatinine at 1.13 two months ago.  Plan includes IV fluids here and recheck of serum creatinine.  If improving, patient felt stable for discharge home.  Admission if AKI persists.   Labs Reviewed  URINALYSIS, ROUTINE W REFLEX MICROSCOPIC - Abnormal; Notable for the following components:      Result Value   Color, Urine STRAW (*)    Hgb urine dipstick SMALL (*)    All other components within normal limits  CBC WITH DIFFERENTIAL/PLATELET - Abnormal; Notable for the following components:   Hemoglobin 11.1 (*)    HCT 34.5 (*)    All other components within normal limits  COMPREHENSIVE METABOLIC PANEL - Abnormal; Notable for the following components:   BUN 27 (*)    Creatinine, Ser 1.92 (*)    GFR, Estimated 31 (*)    All other components within normal limits  CREATININE, SERUM - Abnormal; Notable for the following components:   Creatinine, Ser 1.67 (*)    GFR, Estimated 37 (*)    All other components within normal limits  RESP PANEL BY RT-PCR (FLU A&B, COVID) [id number]  GASTROINTESTINAL PANEL BY PCR, STOOL (REPLACES STOOL CULTURE)  C DIFFICILE QUICK SCREEN W PCR REFLEX  LIPASE, BLOOD  MAGNESIUM  CBC WITH DIFFERENTIAL/PLATELET     On recheck,  patient resting comfortably.  No further diarrhea during ER stay.  She has tolerated IV and oral fluids and reports feeling better.  She is requesting discharge home.  Repeat serum creatinine improving although still elevated from baseline.  Discussed importance of oral hydration and recheck of her creatinine in 24 to 48 hours.  She agrees to plan and close follow-up with PCP.  Strict return precautions were also discussed.    Pauline Aus, PA-C 01/26/21 [id number]    Mancel Bale, MD 01/27/21 0000

## 2021-01-26 NOTE — ED Triage Notes (Signed)
Pt states dr told her to come get kidneys levels check. Pt also c/o diarrhea that started last Sunday.

## 2021-01-26 NOTE — ED Notes (Signed)
Pt ambulated to bathroom without difficulty. Gait steady. Pt appears to be in NAD. Will continue to monitor.

## 2021-01-26 NOTE — ED Provider Notes (Signed)
[facility] EMERGENCY DEPARTMENT Provider Note   CSN: [id number] Arrival date & time: 01/26/21  1127     History Chief Complaint  Patient presents with  . Diarrhea    Glenda Thompson is a 50 y.o. female who was sent in by PCP for elevated serum creatinine.  Patient has had 1 week of diarrhea, nausea, anorexia.  She has been seen in the ER had multiple online visits and had a colonoscopy in the interim.  She is currently on Cipro and Flagyl.  Patient's states that she had an online visit yesterday and continued to have weakness, fatigue, diarrhea.  She had labs drawn last night and was called this morning to tell her that her kidney function was abnormal and she needed to come to the ER.  She states that she has been having some blood on the tissue paper when she wipes but that her stool is brown and watery.  She continues to have poor appetite and poor oral intake.  HPI     Past Medical History:  Diagnosis Date  . Arthritis   . Diabetes mellitus   . Diabetes mellitus without complication ([place])    Phreesia 01/19/2021  . Hyperlipidemia   . Hypertension     Patient Active Problem List   Diagnosis Date Noted  . Abdominal pain 01/21/2021  . Nerve pain 08/28/2020  . Acute pain of right shoulder 08/28/2020  . Back pain, thoracic 03/06/2019  . Essential hypertension, benign 08/09/2018  . Annual physical exam 05/15/2013  . Vitamin D deficiency 10/13/2011  . FATIGUE 09/24/2008  . Mixed hyperlipidemia 09/06/2008  . Type 2 diabetes mellitus with vascular disease ([place]) 05/29/2008  . Morbid obesity ([place]) 05/29/2008  . Hypertension goal BP (blood pressure) < 130/80 05/29/2008    Past Surgical History:  Procedure Laterality Date  . CESAREAN SECTION    . COLONOSCOPY  01/22/2021     OB History   No obstetric history on file.     Family History  Problem Relation Age of Onset  . Hypertension Mother   . Diabetes Mother   . Hyperlipidemia Mother   . Diabetes Brother      Social History   Tobacco Use  . Smoking status: Never Smoker  . Smokeless tobacco: Never Used  Vaping Use  . Vaping Use: Never used  Substance Use Topics  . Alcohol use: Never  . Drug use: Never    Home Medications Prior to Admission medications   Medication Sig Start Date End Date Taking? Authorizing Provider  amLODipine (NORVASC) 10 MG tablet TAKE 1 TABLET(10 MG) BY MOUTH DAILY Patient taking differently: Take 10 mg by mouth daily. 12/09/20   Fayrene Helper, MD  blood glucose meter kit and supplies Three times daily dx E11.65 Freestyle Lite 01/28/20   Fayrene Helper, MD  ciprofloxacin (CIPRO) 500 MG tablet Take 1 tablet (500 mg total) by mouth 2 (two) times daily for 7 days. 01/22/21 01/29/21  Harvel Quale, MD  fluticasone (FLONASE) 50 MCG/ACT nasal spray Place 2 sprays into both nostrils daily. Patient taking differently: Place 2 sprays into both nostrils daily as needed for allergies. 03/06/19   Fayrene Helper, MD  gabapentin (NEURONTIN) 100 MG capsule Take 1 capsule (100 mg total) by mouth 2 (two) times daily. 08/28/20   Perlie Mayo, NP  glipiZIDE (GLUCOTROL XL) 10 MG 24 hr tablet Take 1 tablet (10 mg total) by mouth daily with breakfast. Patient not taking: Reported on 01/25/2021 12/01/20  Lindell Spar, MD  glipiZIDE (GLUCOTROL XL) 5 MG 24 hr tablet TAKE 1 TABLET BY MOUTH EVERY DAY WITH BREAKFAST Patient taking differently: Take 5 mg by mouth daily with breakfast. 12/07/20   Fayrene Helper, MD  glucose blood (FREESTYLE LITE) test strip USE TO TEST THREE TIMES DAILY 08/30/18   Cassandria Anger, MD  Insulin Pen Needle (B-D ULTRAFINE III SHORT PEN) 31G X 8 MM MISC Use as directed to inject insulin daily. 06/28/18   Fayrene Helper, MD  Lancets (FREESTYLE) lancets USE TO OBTAIN A BLOOD SPECIMEN TO TEST BLOOD SUGAR TWICE A DAY 02/08/19   Fayrene Helper, MD  lisinopril-hydrochlorothiazide (ZESTORETIC) 20-12.5 MG tablet TAKE 2 TABLETS BY MOUTH  DAILY 01/13/21   Fayrene Helper, MD  metFORMIN (GLUCOPHAGE-XR) 500 MG 24 hr tablet TAKE 1 TABLET BY MOUTH TWICE A DAY Patient taking differently: Take 500 mg by mouth 2 (two) times daily. 11/04/20   Fayrene Helper, MD  metroNIDAZOLE (FLAGYL) 500 MG tablet Take 1 tablet (500 mg total) by mouth 3 (three) times daily for 7 days. 01/22/21 01/29/21  Harvel Quale, MD  Multiple Vitamin (MULTIVITAMIN WITH MINERALS) TABS tablet Take 1 tablet by mouth daily.    [provider]  ondansetron (ZOFRAN) 4 MG tablet Take 1 tablet (4 mg total) by mouth every 6 (six) hours as needed for nausea or vomiting. 01/18/21   Pollina, Gwenyth Allegra, MD  rosuvastatin (CRESTOR) 5 MG tablet TAKE 1 TABLET BY MOUTH EVERY DAY Patient taking differently: Take 5 mg by mouth daily. 11/04/20   Fayrene Helper, MD  Semaglutide (RYBELSUS) 3 MG TABS Take 1 tablet by mouth daily. 12/01/20   Lindell Spar, MD  Semaglutide (RYBELSUS) 7 MG TABS Take 1 tablet by mouth daily. Patient taking differently: Take 7 mg by mouth daily. 12/31/20   Lindell Spar, MD  spironolactone (ALDACTONE) 50 MG tablet Take 1 tablet (50 mg total) by mouth daily. 10/21/20   Fayrene Helper, MD  TURMERIC PO Take 2 capsules by mouth 3 (three) times a week.    [provider]    Allergies    Patient has no known allergies.  Review of Systems   Review of Systems Ten systems reviewed and are negative for acute change, except as noted in the HPI.   Physical Exam Updated Vital Signs BP (!) 123/53   Pulse 73   Temp 98.2 F (36.8 C) (Oral)   Resp 16   Ht '5\' 4"'  (1.626 m)   Wt 113.4 kg   LMP 11/02/2018   SpO2 100%   BMI 42.91 kg/m   Physical Exam Vitals and nursing note reviewed.  Constitutional:      General: She is not in acute distress.    Appearance: She is well-developed. She is not diaphoretic.  HENT:     Head: Normocephalic and atraumatic.  Eyes:     General: No scleral icterus.    Conjunctiva/sclera:  Conjunctivae normal.  Cardiovascular:     Rate and Rhythm: Normal rate and regular rhythm.     Heart sounds: Normal heart sounds. No murmur heard. No friction rub. No gallop.   Pulmonary:     Effort: Pulmonary effort is normal. No respiratory distress.     Breath sounds: Normal breath sounds.  Abdominal:     General: Bowel sounds are normal. There is no distension.     Palpations: Abdomen is soft. There is no mass.     Tenderness: There is  no abdominal tenderness. There is no guarding.  Musculoskeletal:     Cervical back: Normal range of motion.  Skin:    General: Skin is warm and dry.  Neurological:     Mental Status: She is alert and oriented to person, place, and time.  Psychiatric:        Behavior: Behavior normal.     ED Results / Procedures / Treatments   Labs (all labs ordered are listed, but only abnormal results are displayed) Labs Reviewed  URINALYSIS, ROUTINE W REFLEX MICROSCOPIC - Abnormal; Notable for the following components:      Result Value   Color, Urine STRAW (*)    Hgb urine dipstick SMALL (*)    All other components within normal limits  CBC WITH DIFFERENTIAL/PLATELET - Abnormal; Notable for the following components:   Hemoglobin 11.1 (*)    HCT 34.5 (*)    All other components within normal limits  COMPREHENSIVE METABOLIC PANEL - Abnormal; Notable for the following components:   BUN 27 (*)    Creatinine, Ser 1.92 (*)    GFR, Estimated 31 (*)    All other components within normal limits  RESP PANEL BY RT-PCR (FLU A&B, COVID) [id number]  GASTROINTESTINAL PANEL BY PCR, STOOL (REPLACES STOOL CULTURE)  C DIFFICILE QUICK SCREEN W PCR REFLEX  LIPASE, BLOOD  MAGNESIUM  CBC WITH DIFFERENTIAL/PLATELET  CREATININE, SERUM    EKG None  Radiology No results found.  Procedures Procedures   Medications Ordered in ED Medications  sodium chloride 0.9 % bolus 1,000 mL (1,000 mLs Intravenous New Bag/Given 01/26/21 1553)  sodium chloride 0.9 % bolus 2,000 mL  (2,000 mLs Intravenous New Bag/Given 01/26/21 1725)    ED Course  I have reviewed the triage vital signs and the nursing notes.  Pertinent labs & imaging results that were available during my care of the patient were reviewed by me and considered in my medical decision making (see chart for details).  Clinical Course as of 01/26/21 1817  Tue Jan 26, 2021  1547 Creatinine(!): 1.92 [AH]  1547 Comprehensive metabolic panel(!) [AH]    Clinical Course User Index [AH] Margarita Mail, PA-C   MDM Rules/Calculators/A&P                          Patient  Here with diarrhea and and AKI.  I ordered and reviewed labs.  Lipase within normal limits, magnesium within normal limits.  CBC without significant abnormality.  Urine does not show any signs of infection.  Covid panel is negative.  I ordered a CMP that shows a creatinine of 1.92.  Patient's baseline is about 1.1.  Her creatinine yesterday was 2.5.  Given the fact that her creatinine is improving patient is receiving 30 mL/kg of fluid.  Recheck serum creatinine.  If she is improving I think she is safe to go home with continued oral hydration.  I have given signout to Palmer who will follow up on these labs. Final Clinical Impression(s) / ED Diagnoses Final diagnoses:  None    Rx / DC Orders ED Discharge Orders    None       Margarita Mail, PA-C 01/26/21 1859    Milton Ferguson, MD 01/27/21 [phone]

## 2021-01-26 NOTE — Discharge Instructions (Addendum)
As discussed, it is very important that you continue to drink plenty of water.  You will need to have your kidney functions rechecked in 24 to 48 hours.  Your primary care provider can arrange this for you.  Call her office tomorrow to arrange follow-up appointment.  Return emergency department for any worsening symptoms.

## 2021-01-27 ENCOUNTER — Telehealth: Payer: Self-pay

## 2021-01-27 NOTE — Telephone Encounter (Signed)
Appt scheduled 4/7

## 2021-01-27 NOTE — Telephone Encounter (Signed)
Yes, needs ER follow up to address

## 2021-01-27 NOTE — Telephone Encounter (Signed)
Patient called seen in ER about her lab for kidney yesterday she said need new order to have labs re done.  Is an appt needed? Patient cb# [phone]

## 2021-01-28 ENCOUNTER — Ambulatory Visit: Payer: BC Managed Care – PPO | Admitting: Internal Medicine

## 2021-01-28 ENCOUNTER — Other Ambulatory Visit: Payer: Self-pay

## 2021-01-28 ENCOUNTER — Encounter: Payer: Self-pay | Admitting: Internal Medicine

## 2021-01-28 VITALS — BP 126/79 | HR 91 | Temp 98.8°F | Resp 18 | Ht 65.0 in | Wt 251.8 lb

## 2021-01-28 DIAGNOSIS — K529 Noninfective gastroenteritis and colitis, unspecified: Secondary | ICD-10-CM | POA: Diagnosis not present

## 2021-01-28 DIAGNOSIS — Z09 Encounter for follow-up examination after completed treatment for conditions other than malignant neoplasm: Secondary | ICD-10-CM | POA: Diagnosis not present

## 2021-01-28 DIAGNOSIS — N179 Acute kidney failure, unspecified: Secondary | ICD-10-CM | POA: Insufficient documentation

## 2021-01-28 DIAGNOSIS — Z8489 Family history of other specified conditions: Secondary | ICD-10-CM

## 2021-01-28 DIAGNOSIS — E1159 Type 2 diabetes mellitus with other circulatory complications: Secondary | ICD-10-CM | POA: Diagnosis not present

## 2021-01-28 NOTE — Assessment & Plan Note (Signed)
ER chart reviewed - had IV hydration for AKI Will check BMP in the next week

## 2021-01-28 NOTE — Assessment & Plan Note (Signed)
Lab Results  Component Value Date   HGBA1C 9.1 (H) 11/24/2020   Home glucose readings around 120-60 most of the time Continue Metformin 500 mg BID On Rybelsus 7 mg QD currently On Glipizide 5 mg QD

## 2021-01-28 NOTE — Assessment & Plan Note (Signed)
On Cipro and Flagyl Now better No melena or hematochezia now

## 2021-01-28 NOTE — Telephone Encounter (Signed)
complete

## 2021-01-28 NOTE — Progress Notes (Signed)
Established Patient Office Visit  Subjective:  Patient ID: Glenda Thompson, female    DOB: [birth year]  Age: 50 y.o. MRN: [id number]  CC:  Chief Complaint  Patient presents with  . Follow-up    ER follow up we sent due to kidney functioning on blood work they told her to follow up with primary she does feel better     HPI Glenda Thompson presents for follow up after ER visit for AKI.  She was given IV hydration for AKI, which improved her S. Cr. She has been feeling better now. She is still taking Ciprofloxacin and Flagyl for colitis. Denies melena, hematochezia, abdominal pain. Denies fever, chills, nausea or vomiting. She still feels fatigue, but has been improving.  She has been taking Glipizide 5 mg QD and Rybelsus 7 mg QD. Blood glucose have been baround 120-160. She could not get Glipizide 10 mg from pharmacy.  Past Medical History:  Diagnosis Date  . Arthritis   . Diabetes mellitus   . Diabetes mellitus without complication ([place])    Phreesia 01/19/2021  . Hyperlipidemia   . Hypertension     Past Surgical History:  Procedure Laterality Date  . CESAREAN SECTION    . COLONOSCOPY  01/22/2021    Family History  Problem Relation Age of Onset  . Hypertension Mother   . Diabetes Mother   . Hyperlipidemia Mother   . Diabetes Brother     Social History   Socioeconomic History  . Marital status: Married    Spouse name: Not on file  . Number of children: Not on file  . Years of education: Not on file  . Highest education level: Not on file  Occupational History  . Not on file  Tobacco Use  . Smoking status: Never Smoker  . Smokeless tobacco: Never Used  Vaping Use  . Vaping Use: Never used  Substance and Sexual Activity  . Alcohol use: Never  . Drug use: Never  . Sexual activity: Not on file  Other Topics Concern  . Not on file  Social History Narrative  . Not on file   Social Determinants of Health   Financial Resource Strain: Not on file  Food  Insecurity: Not on file  Transportation Needs: Not on file  Physical Activity: Not on file  Stress: Not on file  Social Connections: Not on file  Intimate Partner Violence: Not on file    Outpatient Medications Prior to Visit  Medication Sig Dispense Refill  . amLODipine (NORVASC) 10 MG tablet TAKE 1 TABLET(10 MG) BY MOUTH DAILY (Patient taking differently: Take 10 mg by mouth daily.) 90 tablet 1  . blood glucose meter kit and supplies Three times daily dx E11.65 Freestyle Lite 1 each 0  . ciprofloxacin (CIPRO) 500 MG tablet Take 1 tablet (500 mg total) by mouth 2 (two) times daily for 7 days. 14 tablet 0  . fluticasone (FLONASE) 50 MCG/ACT nasal spray Place 2 sprays into both nostrils daily. (Patient taking differently: Place 2 sprays into both nostrils daily as needed for allergies.) 16 g 12  . gabapentin (NEURONTIN) 100 MG capsule Take 1 capsule (100 mg total) by mouth 2 (two) times daily. 60 capsule 1  . glipiZIDE (GLUCOTROL XL) 5 MG 24 hr tablet TAKE 1 TABLET BY MOUTH EVERY DAY WITH BREAKFAST (Patient taking differently: Take 5 mg by mouth daily with breakfast.) 90 tablet 1  . glucose blood (FREESTYLE LITE) test strip USE TO TEST THREE TIMES DAILY 100 each 5  .  Insulin Pen Needle (B-D ULTRAFINE III SHORT PEN) 31G X 8 MM MISC Use as directed to inject insulin daily. 100 each 2  . Lancets (FREESTYLE) lancets USE TO OBTAIN A BLOOD SPECIMEN TO TEST BLOOD SUGAR TWICE A DAY 200 each 5  . lisinopril-hydrochlorothiazide (ZESTORETIC) 20-12.5 MG tablet TAKE 2 TABLETS BY MOUTH DAILY 180 tablet 1  . metFORMIN (GLUCOPHAGE-XR) 500 MG 24 hr tablet TAKE 1 TABLET BY MOUTH TWICE A DAY (Patient taking differently: Take 500 mg by mouth 2 (two) times daily.) 180 tablet 1  . metroNIDAZOLE (FLAGYL) 500 MG tablet Take 1 tablet (500 mg total) by mouth 3 (three) times daily for 7 days. 21 tablet 0  . Multiple Vitamin (MULTIVITAMIN WITH MINERALS) TABS tablet Take 1 tablet by mouth daily.    . ondansetron (ZOFRAN) 4  MG tablet Take 1 tablet (4 mg total) by mouth every 6 (six) hours as needed for nausea or vomiting. 20 tablet 0  . rosuvastatin (CRESTOR) 5 MG tablet TAKE 1 TABLET BY MOUTH EVERY DAY (Patient taking differently: Take 5 mg by mouth daily.) 90 tablet 1  . Semaglutide (RYBELSUS) 3 MG TABS Take 1 tablet by mouth daily. 30 tablet 0  . Semaglutide (RYBELSUS) 7 MG TABS Take 1 tablet by mouth daily. (Patient taking differently: Take 7 mg by mouth daily.) 30 tablet 1  . spironolactone (ALDACTONE) 50 MG tablet Take 1 tablet (50 mg total) by mouth daily. 90 tablet 1  . TURMERIC PO Take 2 capsules by mouth 3 (three) times a week.    Marland Kitchen glipiZIDE (GLUCOTROL XL) 10 MG 24 hr tablet Take 1 tablet (10 mg total) by mouth daily with breakfast. (Patient not taking: No sig reported) 90 tablet 0   No facility-administered medications prior to visit.    No Known Allergies  ROS Review of Systems  Constitutional: Positive for fatigue. Negative for chills and fever.  HENT: Negative for congestion, sinus pressure, sinus pain and sore throat.   Eyes: Negative for pain and discharge.  Respiratory: Negative for cough and shortness of breath.   Cardiovascular: Negative for chest pain and palpitations.  Gastrointestinal: Negative for abdominal pain, constipation, diarrhea, nausea and vomiting.  Endocrine: Negative for polydipsia and polyuria.  Genitourinary: Negative for dysuria and hematuria.  Musculoskeletal: Negative for neck pain and neck stiffness.  Skin: Negative for rash.  Neurological: Negative for dizziness and weakness.  Psychiatric/Behavioral: Negative for agitation and behavioral problems.      Objective:    Physical Exam Vitals reviewed.  Constitutional:      General: She is not in acute distress.    Appearance: She is not diaphoretic.  HENT:     Head: Normocephalic and atraumatic.     Nose: Nose normal. No congestion.     Mouth/Throat:     Mouth: Mucous membranes are moist.     Pharynx: No  posterior oropharyngeal erythema.  Eyes:     General: No scleral icterus.    Extraocular Movements: Extraocular movements intact.  Cardiovascular:     Rate and Rhythm: Normal rate and regular rhythm.     Pulses: Normal pulses.     Heart sounds: Normal heart sounds. No murmur heard.   Pulmonary:     Breath sounds: Normal breath sounds. No wheezing or rales.  Abdominal:     Palpations: Abdomen is soft.     Tenderness: There is no abdominal tenderness.  Musculoskeletal:     Cervical back: Neck supple. No tenderness.     Right lower leg:  No edema.     Left lower leg: No edema.  Skin:    General: Skin is warm.     Findings: No rash.  Neurological:     General: No focal deficit present.     Mental Status: She is alert and oriented to person, place, and time.  Psychiatric:        Mood and Affect: Mood normal.        Behavior: Behavior normal.     BP 126/79 (BP Location: Right Arm, Patient Position: Sitting, Cuff Size: Normal)   Pulse 91   Temp 98.8 F (37.1 C) (Oral)   Resp 18   Ht '5\' 5"'  (1.651 m)   Wt 251 lb 12.8 oz (114.2 kg)   LMP 11/02/2018   SpO2 100%   BMI 41.90 kg/m  Wt Readings from Last 3 Encounters:  01/28/21 251 lb 12.8 oz (114.2 kg)  01/26/21 250 lb (113.4 kg)  01/21/21 250 lb (113.4 kg)     Health Maintenance Due  Topic Date Due  . OPHTHALMOLOGY EXAM  04/10/2020    There are no preventive care reminders to display for this patient.  Lab Results  Component Value Date   TSH 2.72 06/15/2020   Lab Results  Component Value Date   WBC 9.1 01/26/2021   HGB 11.1 (L) 01/26/2021   HCT 34.5 (L) 01/26/2021   MCV 87.3 01/26/2021   PLT 322 01/26/2021   Lab Results  Component Value Date   NA 136 01/26/2021   K 4.5 01/26/2021   CO2 22 01/26/2021   GLUCOSE 86 01/26/2021   BUN 27 (H) 01/26/2021   CREATININE 1.67 (H) 01/26/2021   BILITOT 0.5 01/26/2021   ALKPHOS 40 01/26/2021   AST 27 01/26/2021   ALT 23 01/26/2021   PROT 7.4 01/26/2021   ALBUMIN  4.4 01/26/2021   CALCIUM 9.4 01/26/2021   ANIONGAP 10 01/26/2021   Lab Results  Component Value Date   CHOL 184 11/24/2020   Lab Results  Component Value Date   HDL 66 11/24/2020   Lab Results  Component Value Date   LDLCALC 99 11/24/2020   Lab Results  Component Value Date   TRIG 109 11/24/2020   Lab Results  Component Value Date   CHOLHDL 2.8 11/24/2020   Lab Results  Component Value Date   HGBA1C 9.1 (H) 11/24/2020      Assessment & Plan:   Problem List Items Addressed This Visit      Cardiovascular and Mediastinum   Type 2 diabetes mellitus with vascular disease ([place])    Lab Results  Component Value Date   HGBA1C 9.1 (H) 11/24/2020   Home glucose readings around 120-60 most of the time Continue Metformin 500 mg BID On Rybelsus 7 mg QD currently On Glipizide 5 mg QD        Digestive   Colitis    On Cipro and Flagyl Now better No melena or hematochezia now        Genitourinary   AKI (acute kidney injury) ([place])    Lab Results  Component Value Date   CREATININE 1.67 (H) 01/26/2021   Better with IV hydration in ER Will check BMP after 1 week Advised to maintain proper hydration      Relevant Orders   Basic Metabolic Panel (BMET)     Other   Encounter for examination following treatment at hospital - Primary    ER chart reviewed - had IV hydration for AKI Will check BMP in the  next week         No orders of the defined types were placed in this encounter.   Follow-up: No follow-ups on file.    Lindell Spar, MD

## 2021-01-28 NOTE — Assessment & Plan Note (Signed)
Lab Results  Component Value Date   CREATININE 1.67 (H) 01/26/2021   Better with IV hydration in ER Will check BMP after 1 week Advised to maintain proper hydration

## 2021-01-28 NOTE — Patient Instructions (Signed)
Continue taking medications as prescribed.  Please take at least 2.5 l of fluid in a day to avoid dehydration and more in case of vomiting or diarrhea.  Please continue to follow low carb diet and perform moderate exercise/walking at least 150 mins/week.  Please get blood test done in the next week.

## 2021-02-01 ENCOUNTER — Encounter: Payer: Self-pay | Admitting: Internal Medicine

## 2021-02-01 ENCOUNTER — Telehealth: Payer: Self-pay

## 2021-02-01 NOTE — Telephone Encounter (Signed)
Patient walkin in this AM went to work and told her she needed a note saying return to work with no restrictions. Excused 01-26-21 until 02-01-21 . Patient wants to wait for the letter.

## 2021-02-01 NOTE — Telephone Encounter (Signed)
Done

## 2021-02-08 ENCOUNTER — Telehealth: Payer: Self-pay

## 2021-02-08 NOTE — Telephone Encounter (Signed)
FMLA forms received  Copied Noted Sleeved  

## 2021-02-10 ENCOUNTER — Telehealth: Payer: Self-pay

## 2021-02-10 NOTE — Telephone Encounter (Signed)
PT states she would like her Phentermine refilled and to take a whole pill , not just half --please call

## 2021-02-11 NOTE — Telephone Encounter (Signed)
Phentermine is not on patients med list will need an in office appt

## 2021-02-14 DIAGNOSIS — Z0279 Encounter for issue of other medical certificate: Secondary | ICD-10-CM

## 2021-02-15 NOTE — Telephone Encounter (Signed)
LVM that the pt needs a office visit with Dr Lodema Hong

## 2021-02-15 NOTE — Telephone Encounter (Signed)
Complete lvm making patient aware

## 2021-03-02 ENCOUNTER — Ambulatory Visit (INDEPENDENT_AMBULATORY_CARE_PROVIDER_SITE_OTHER): Payer: BC Managed Care – PPO | Admitting: Family Medicine

## 2021-03-02 ENCOUNTER — Encounter: Payer: Self-pay | Admitting: Family Medicine

## 2021-03-02 ENCOUNTER — Other Ambulatory Visit: Payer: Self-pay

## 2021-03-02 ENCOUNTER — Other Ambulatory Visit: Payer: Self-pay | Admitting: Internal Medicine

## 2021-03-02 VITALS — BP 137/83 | HR 96 | Temp 98.6°F | Ht 65.0 in | Wt 249.0 lb

## 2021-03-02 DIAGNOSIS — E1159 Type 2 diabetes mellitus with other circulatory complications: Secondary | ICD-10-CM

## 2021-03-02 DIAGNOSIS — I10 Essential (primary) hypertension: Secondary | ICD-10-CM

## 2021-03-02 DIAGNOSIS — R221 Localized swelling, mass and lump, neck: Secondary | ICD-10-CM | POA: Diagnosis not present

## 2021-03-02 DIAGNOSIS — N179 Acute kidney failure, unspecified: Secondary | ICD-10-CM | POA: Diagnosis not present

## 2021-03-02 DIAGNOSIS — E782 Mixed hyperlipidemia: Secondary | ICD-10-CM

## 2021-03-02 DIAGNOSIS — Z1231 Encounter for screening mammogram for malignant neoplasm of breast: Secondary | ICD-10-CM

## 2021-03-02 MED ORDER — PHENTERMINE HCL 37.5 MG PO TABS: ORAL_TABLET | ORAL | 1 refills | Status: DC

## 2021-03-02 NOTE — Patient Instructions (Addendum)
F/U in 7 weeks, call if you need me sooner  New for weight loss is phentermine HALF tab daily  It is important that you exercise regularly at least 30 minutes 5 times a week. If you develop chest pain, have severe difficulty breathing, or feel very tired, stop exercising immediately and seek medical attention    Please schedule mammogram at checkout  You are referred for [place] of your neck due to swelling, also for eye exam   Thanks for choosing [facility], we consider it a privelige to serve you.

## 2021-03-02 NOTE — Assessment & Plan Note (Signed)
Glenda Thompson is reminded of the importance of commitment to daily physical activity for 30 minutes or more, as able and the need to limit carbohydrate intake to 30 to 60 grams per meal to help with blood sugar control.   The need to take medication as prescribed, test blood sugar as directed, and to call between visits if there is a concern that blood sugar is uncontrolled is also discussed.   Glenda Thompson is reminded of the importance of daily foot exam, annual eye examination, and good blood sugar, blood pressure and cholesterol control.  Diabetic Labs Latest Ref Rng & Units 01/26/2021 01/26/2021 01/25/2021 01/17/2021 11/24/2020  HbA1c 4.8 - 5.6 % - - - - 9.1(H)  Microalbumin mg/dL - - - - -  Micro/Creat Ratio <30 mcg/mg creat - - - - -  Chol 100 - 199 mg/dL - - - - 497  HDL >02 mg/dL - - - - 66  Calc LDL 0 - 99 mg/dL - - - - 99  Triglycerides 0 - 149 mg/dL - - - - 637  Creatinine 0.44 - 1.00 mg/dL 8.58(I) 5.02(D) 7.41(O) 1.42(H) 1.13(H)   BP/Weight 03/02/2021 01/28/2021 01/26/2021 01/22/2021 01/21/2021 01/18/2021 01/17/2021  Systolic BP 137 126 145 101 151 136 -  Diastolic BP 83 79 78 49 79 59 -  Wt. (Lbs) 249 251.8 250 - 250 - 250  BMI 41.44 41.9 42.91 - 42.91 - 42.91   Foot/eye exam completion dates Latest Ref Rng & Units 06/22/2020 04/11/2019  Eye Exam No Retinopathy - No Retinopathy  Foot Form Completion - Done -

## 2021-03-02 NOTE — Assessment & Plan Note (Signed)
Painless swelling , right, need [place], diameter aspprox 3.5 cm

## 2021-03-03 ENCOUNTER — Encounter: Payer: Self-pay | Admitting: Family Medicine

## 2021-03-03 LAB — BASIC METABOLIC PANEL
BUN/Creatinine Ratio: 20 (ref 9–23)
BUN: 26 mg/dL — ABNORMAL HIGH (ref 6–24)
CO2: 22 mmol/L (ref 20–29)
Calcium: 9.9 mg/dL (ref 8.7–10.2)
Chloride: 102 mmol/L (ref 96–106)
Creatinine, Ser: 1.27 mg/dL — ABNORMAL HIGH (ref 0.57–1.00)
Glucose: 144 mg/dL — ABNORMAL HIGH (ref 65–99)
Potassium: 4.7 mmol/L (ref 3.5–5.2)
Sodium: 139 mmol/L (ref 134–144)
eGFR: 52 mL/min/{1.73_m2} — ABNORMAL LOW (ref >59)

## 2021-03-03 NOTE — Progress Notes (Signed)
Glenda Thompson     MRN: [id number]      DOB: [birth year]   HPI Glenda Thompson is here for follow up and re-evaluation of chronic medical conditions, medication management and review of any available recent lab and radiology data.  Preventive health is updated, specifically  Cancer screening and Immunization.   Questions or concerns regarding consultations or procedures which the PT has had in the interim are  addressed. The PT denies any adverse reactions to current medications since the last visit.  Requests medication for weight loss   ROS Denies recent fever or chills. Denies sinus pressure, nasal congestion, ear pain or sore throat. Denies chest congestion, productive cough or wheezing. Denies chest pains, palpitations and leg swelling Denies abdominal pain, nausea, vomiting,diarrhea or constipation.   Denies dysuria, frequency, hesitancy or incontinence. C/o right shoulder and arm pain Denies headaches, seizures, numbness, or tingling. Denies depression, anxiety or insomnia. Denies skin break down or rash.   PE  BP 137/83 (BP Location: Left Arm, Patient Position: Sitting, Cuff Size: Large)   Pulse 96   Temp 98.6 F (37 C) (Temporal)   Ht 5\' 5"  (1.651 m)   Wt 249 lb (112.9 kg)   LMP 11/02/2018   SpO2 99%   BMI 41.44 kg/m   Patient alert and oriented and in no cardiopulmonary distress.  HEENT: No facial asymmetry, EOMI,     Neck supple .rigth neck mass  Chest: Clear to auscultation bilaterally.  CVS: S1, S2 no murmurs, no S3.Regular rate.  ABD: Soft non tender.   Ext: No edema  MS: Adequate ROM spine, shoulders, hips and knees.  Skin: Intact, no ulcerations or rash noted.  Psych: Good eye contact, normal affect. Memory intact not anxious or depressed appearing.  CNS: CN 2-12 intact, power,  normal throughout.no focal deficits noted.   Assessment & Plan  Type 2 diabetes mellitus with vascular disease [facility]) Glenda Thompson is reminded of the importance of  commitment to daily physical activity for 30 minutes or more, as able and the need to limit carbohydrate intake to 30 to 60 grams per meal to help with blood sugar control.   The need to take medication as prescribed, test blood sugar as directed, and to call between visits if there is a concern that blood sugar is uncontrolled is also discussed.   Glenda Thompson is reminded of the importance of daily foot exam, annual eye examination, and good blood sugar, blood pressure and cholesterol control.  Diabetic Labs Latest Ref Rng & Units 01/26/2021 01/26/2021 01/25/2021 01/17/2021 11/24/2020  HbA1c 4.8 - 5.6 % - - - - 9.1(H)  Microalbumin mg/dL - - - - -  Micro/Creat Ratio <30 mcg/mg creat - - - - -  Chol 100 - 199 mg/dL - - - - 01/22/2021  HDL 924 mg/dL - - - - 66  Calc LDL 0 - 99 mg/dL - - - - 99  Triglycerides 0 - 149 mg/dL - - - - 09-10-1999  Creatinine 0.44 - 1.00 mg/dL 834) 1.96(Q) 2.29(N) 1.42(H) 1.13(H)   BP/Weight 03/02/2021 01/28/2021 01/26/2021 01/22/2021 01/21/2021 01/18/2021 01/17/2021  Systolic BP 137 126 145 101 151 136 -  Diastolic BP 83 79 78 49 79 59 -  Wt. (Lbs) 249 251.8 250 - 250 - 250  BMI 41.44 41.9 42.91 - 42.91 - 42.91   Foot/eye exam completion dates Latest Ref Rng & Units 06/22/2020 04/11/2019  Eye Exam No Retinopathy - No Retinopathy  Foot Form Completion - Done -  Neck mass Painless swelling , right, need [place], diameter aspprox 3.5 cm  Morbid obesity  Patient re-educated about  the importance of commitment to a  minimum of 150 minutes of exercise per week as able.  The importance of healthy food choices with portion control discussed, as well as eating regularly and within a 12 hour window most days. The need to choose "clean , green" food 50 to 75% of the time is discussed, as well as to make water the primary drink and set a goal of 64 ounces water daily.    Weight /BMI 03/02/2021 01/28/2021 01/26/2021  WEIGHT 249 lb 251 lb 12.8 oz 250 lb  HEIGHT 5\' 5"  5\' 5"  5\' 4"   BMI 41.44  kg/m2 41.9 kg/m2 42.91 kg/m2    Start half phentermine daily  Essential hypertension, benign Controlled, no change in medication DASH diet and commitment to daily physical activity for a minimum of 30 minutes discussed and encouraged, as a part of hypertension management. The importance of attaining a healthy weight is also discussed.  BP/Weight 03/02/2021 01/28/2021 01/26/2021 01/22/2021 01/21/2021 01/18/2021 01/17/2021  Systolic BP 137 126 145 101 151 136 -  Diastolic BP 83 79 78 49 79 59 -  Wt. (Lbs) 249 251.8 250 - 250 - 250  BMI 41.44 41.9 42.91 - 42.91 - 42.91       Mixed hyperlipidemia Hyperlipidemia:Low fat diet discussed and encouraged.   Lipid Panel  Lab Results  Component Value Date   CHOL 184 11/24/2020   HDL 66 11/24/2020   LDLCALC 99 11/24/2020   TRIG 109 11/24/2020   CHOLHDL 2.8 11/24/2020     Controlled, no change in medication

## 2021-03-03 NOTE — Assessment & Plan Note (Signed)
Controlled, no change in medication DASH diet and commitment to daily physical activity for a minimum of 30 minutes discussed and encouraged, as a part of hypertension management. The importance of attaining a healthy weight is also discussed.  BP/Weight 03/02/2021 01/28/2021 01/26/2021 01/22/2021 01/21/2021 01/18/2021 01/17/2021  Systolic BP 137 126 145 101 151 136 -  Diastolic BP 83 79 78 49 79 59 -  Wt. (Lbs) 249 251.8 250 - 250 - 250  BMI 41.44 41.9 42.91 - 42.91 - 42.91

## 2021-03-03 NOTE — Assessment & Plan Note (Signed)
  Patient re-educated about  the importance of commitment to a  minimum of 150 minutes of exercise per week as able.  The importance of healthy food choices with portion control discussed, as well as eating regularly and within a 12 hour window most days. The need to choose "clean , green" food 50 to 75% of the time is discussed, as well as to make water the primary drink and set a goal of 64 ounces water daily.    Weight /BMI 03/02/2021 01/28/2021 01/26/2021  WEIGHT 249 lb 251 lb 12.8 oz 250 lb  HEIGHT 5\' 5"  5\' 5"  5\' 4"   BMI 41.44 kg/m2 41.9 kg/m2 42.91 kg/m2    Start half phentermine daily

## 2021-03-03 NOTE — Assessment & Plan Note (Signed)
Hyperlipidemia:Low fat diet discussed and encouraged.   Lipid Panel  Lab Results  Component Value Date   CHOL 184 11/24/2020   HDL 66 11/24/2020   LDLCALC 99 11/24/2020   TRIG 109 11/24/2020   CHOLHDL 2.8 11/24/2020     Controlled, no change in medication

## 2021-03-04 ENCOUNTER — Ambulatory Visit: Payer: BC Managed Care – PPO | Admitting: Family Medicine

## 2021-03-08 ENCOUNTER — Telehealth: Payer: Self-pay

## 2021-03-08 NOTE — Telephone Encounter (Signed)
FMLA for SP Copied  Noted  Sleeved

## 2021-03-08 NOTE — Telephone Encounter (Signed)
FMLA for Glenda Thompson  Noted  Sleeved

## 2021-03-09 ENCOUNTER — Telehealth: Payer: Self-pay | Admitting: *Deleted

## 2021-03-09 NOTE — Telephone Encounter (Signed)
Monday, 3.28.2022

## 2021-03-09 NOTE — Telephone Encounter (Signed)
This is being filled out for [facility] for food poisoning for one day please advise what one day is needed

## 2021-03-09 NOTE — Telephone Encounter (Signed)
Patient returning call. The day that she was out of work is Mar 02, 2021 she had blood work and an appointment this same day.  Patient asked to give this information to nurse as well:  FMLA must be done for each appointments to keep from getting points at work to keep her job.

## 2021-03-09 NOTE — Telephone Encounter (Signed)
LVM for pt to call the office need clarification on FMLA form. Pt stated she needed for one day but she did not specify the day that she was out of work.

## 2021-03-09 NOTE — Telephone Encounter (Signed)
I need clarification for the one day Glenda Thompson was out for food poisoning. Please call the pt back and let me do what day is needed. We cannot do multiple days for [name]

## 2021-03-09 NOTE — Telephone Encounter (Signed)
Left voicemail to return call. 

## 2021-03-09 NOTE — Telephone Encounter (Signed)
noted 

## 2021-03-11 DIAGNOSIS — Z0279 Encounter for issue of other medical certificate: Secondary | ICD-10-CM

## 2021-03-12 ENCOUNTER — Ambulatory Visit (HOSPITAL_COMMUNITY)
Admission: RE | Admit: 2021-03-12 | Discharge: 2021-03-12 | Disposition: A | Discharge status: Home / Self Care | Payer: BC Managed Care – PPO | Source: Ambulatory Visit | Attending: Family Medicine | Admitting: Family Medicine

## 2021-03-12 ENCOUNTER — Other Ambulatory Visit: Payer: Self-pay

## 2021-03-12 DIAGNOSIS — R221 Localized swelling, mass and lump, neck: Secondary | ICD-10-CM | POA: Diagnosis not present

## 2021-03-12 NOTE — Telephone Encounter (Signed)
Completed -pt will pick up

## 2021-03-12 NOTE — Telephone Encounter (Signed)
Completed -pt will pick up 

## 2021-03-12 NOTE — Telephone Encounter (Signed)
Pt pickup forms

## 2021-03-15 ENCOUNTER — Telehealth: Payer: Self-pay

## 2021-03-15 ENCOUNTER — Encounter: Payer: Self-pay | Admitting: Family Medicine

## 2021-03-15 NOTE — Telephone Encounter (Signed)
FMLA page 3 of 4 (question 4 to be completed) Copied Sleeved Noted

## 2021-03-17 NOTE — Telephone Encounter (Signed)
Patient picked up the form

## 2021-03-17 NOTE — Telephone Encounter (Signed)
COMPLETE

## 2021-03-23 ENCOUNTER — Ambulatory Visit: Payer: BC Managed Care – PPO | Admitting: Family Medicine

## 2021-04-06 ENCOUNTER — Telehealth: Payer: Self-pay

## 2021-04-06 NOTE — Telephone Encounter (Signed)
Contacted patient she said this can wait til next week.

## 2021-04-06 NOTE — Telephone Encounter (Signed)
Patient called and said her work needs more specific information to cover her at work to prevent from getting points against her.  Page 3 of 4 (9) FMLA needs to be more specific other than initials, needs a letter typed up with provider signature with details. Patient work says anyone can initial.  Patient call back # [phone].

## 2021-04-06 NOTE — Telephone Encounter (Signed)
I can't change information and sign anything on the fmla forms- has to be a provider. If this cannot wait until dr simpson comes back then will have to see another provider

## 2021-04-12 ENCOUNTER — Other Ambulatory Visit: Payer: Self-pay

## 2021-04-12 ENCOUNTER — Encounter: Payer: Self-pay | Admitting: Internal Medicine

## 2021-04-12 ENCOUNTER — Telehealth (INDEPENDENT_AMBULATORY_CARE_PROVIDER_SITE_OTHER): Payer: BC Managed Care – PPO | Admitting: Internal Medicine

## 2021-04-12 DIAGNOSIS — U071 COVID-19: Secondary | ICD-10-CM | POA: Diagnosis not present

## 2021-04-12 MED ORDER — NIRMATRELVIR/RITONAVIR (PAXLOVID) TABLET (RENAL DOSING)
2.0000 | ORAL_TABLET | Freq: Two times a day (BID) | ORAL | 0 refills | Status: AC
Start: 2021-04-12 — End: 2021-04-17

## 2021-04-12 NOTE — Progress Notes (Signed)
Virtual Visit via Telephone Note   This visit type was conducted due to national recommendations for restrictions regarding the COVID-19 Pandemic (e.g. social distancing) in an effort to limit this patient's exposure and mitigate transmission in our community.  Due to her co-morbid illnesses, this patient is at least at moderate risk for complications without adequate follow up.  This format is felt to be most appropriate for this patient at this time.  The patient did not have access to video technology/had technical difficulties with video requiring transitioning to audio format only (telephone).  All issues noted in this document were discussed and addressed.  No physical exam could be performed with this format.  Evaluation Performed:  Follow-up visit  Date:  04/12/2021   ID:  Glenda Thompson, DOB 06-Jun-1971, MRN [id number]  Patient Location: Home Provider Location: Office/Clinic  Participants: Patient Location of Patient: Home Location of Provider: Telehealth Consent was obtain for visit to be over via telehealth. I verified that I am speaking with the correct person using two identifiers.  PCP:  Glenda Thompson   Chief Complaint:    History of Present Illness:    Glenda Thompson is a 50 y.o. female who has a televisit for c/o cough, sinus headache, sore throat and nasal congestion since yesterday. She had positive COVID test at home today. She denies any dyspnea or wheezing currently. She has had 2 doses of COVID.  The patient does have symptoms concerning for COVID-19 infection (fever, chills, cough, or new shortness of breath).   Past Medical, Surgical, Social History, Allergies, and Medications have been Reviewed.  Past Medical History:  Diagnosis Date   Arthritis    Diabetes mellitus    Diabetes mellitus without complication ([place])    Phreesia 01/19/2021   Hyperlipidemia    Hypertension    Past Surgical History:  Procedure Laterality Date   BIOPSY   01/22/2021   Procedure: BIOPSY;  Surgeon: Harvel Quale, Thompson;  [facility];  Service: Gastroenterology;;   CESAREAN SECTION     COLONOSCOPY  01/22/2021   COLONOSCOPY WITH PROPOFOL N/A 01/22/2021   Procedure: COLONOSCOPY WITH PROPOFOL;  Surgeon: Harvel Quale, Thompson;  [facility]: [facility];  Service: Gastroenterology;  Laterality: N/A;  Am     Current Meds  Medication Sig   amLODipine (NORVASC) 10 MG tablet TAKE 1 TABLET(10 MG) BY MOUTH DAILY (Patient taking differently: Take 10 mg by mouth daily.)   blood glucose meter kit and supplies Three times daily dx E11.65 Freestyle Lite   fluticasone (FLONASE) 50 MCG/ACT nasal spray Place 2 sprays into both nostrils daily. (Patient taking differently: Place 2 sprays into both nostrils daily as needed for allergies.)   glipiZIDE (GLUCOTROL XL) 5 MG 24 hr tablet TAKE 1 TABLET BY MOUTH EVERY DAY WITH BREAKFAST (Patient taking differently: Take 5 mg by mouth daily with breakfast.)   glucose blood (FREESTYLE LITE) test strip USE TO TEST THREE TIMES DAILY   Insulin Pen Needle (B-D ULTRAFINE III SHORT PEN) 31G X 8 MM MISC Use as directed to inject insulin daily.   Lancets (FREESTYLE) lancets USE TO OBTAIN A BLOOD SPECIMEN TO TEST BLOOD SUGAR TWICE A DAY   lisinopril-hydrochlorothiazide (ZESTORETIC) 20-12.5 MG tablet TAKE 2 TABLETS BY MOUTH DAILY   metFORMIN (GLUCOPHAGE-XR) 500 MG 24 hr tablet TAKE 1 TABLET BY MOUTH TWICE A DAY (Patient taking differently: Take 500 mg by mouth 2 (two) times daily.)   Multiple Vitamin (MULTIVITAMIN WITH MINERALS)  TABS tablet Take 1 tablet by mouth daily.   nirmatrelvir/ritonavir EUA, renal dosing, (PAXLOVID) TABS Take 2 tablets by mouth 2 (two) times daily for 5 days. (Take nirmatrelvir 150 mg one tablet twice daily for 5 days and ritonavir 100 mg one tablet twice daily for 5 days) Patient GFR is 52.   phentermine (ADIPEX-P) 37.5 MG tablet Take half tablet by mouth once daily   rosuvastatin  (CRESTOR) 5 MG tablet TAKE 1 TABLET BY MOUTH EVERY DAY (Patient taking differently: Take 5 mg by mouth daily.)   RYBELSUS 7 MG TABS TAKE 1 TABLET BY MOUTH EVERY DAY   spironolactone (ALDACTONE) 50 MG tablet Take 1 tablet (50 mg total) by mouth daily.     Allergies:   Patient has no known allergies.   ROS:   Please see the history of present illness.     All other systems reviewed and are negative.   Labs/Other Tests and Data Reviewed:    Recent Labs: 06/15/2020: TSH 2.72 01/26/2021: ALT 23; Hemoglobin 11.1; Magnesium 2.1; Platelets 322 03/02/2021: BUN 26; Creatinine, Ser 1.27; Potassium 4.7; Sodium 139   Recent Lipid Panel Lab Results  Component Value Date/Time   CHOL 184 11/24/2020 09:35 AM   TRIG 109 11/24/2020 09:35 AM   HDL 66 11/24/2020 09:35 AM   CHOLHDL 2.8 11/24/2020 09:35 AM   CHOLHDL 1.9 06/15/2020 10:19 AM   LDLCALC 99 11/24/2020 09:35 AM   LDLCALC 63 06/15/2020 10:19 AM    Wt Readings from Last 3 Encounters:  03/02/21 249 lb (112.9 kg)  01/28/21 251 lb 12.8 oz (114.2 kg)  01/26/21 250 lb (113.4 kg)     ASSESSMENT & PLAN:    COVID-19 infection Started Paxlovid - renally dosed due to CKD Robitussin or Mucinex PRN Nasal saline spray PRN Self-quarantine for total of 7 days or 24-hour afebrile period whichever is later   Time:   Today, I have spent 12 minutes reviewing the chart, including problem list, medications, and with the patient with telehealth technology discussing the above problems.   Medication Adjustments/Labs and Tests Ordered: Current medicines are reviewed at length with the patient today.  Concerns regarding medicines are outlined above.   Tests Ordered: No orders of the defined types were placed in this encounter.   Medication Changes: Meds ordered this encounter  Medications   nirmatrelvir/ritonavir EUA, renal dosing, (PAXLOVID) TABS    Sig: Take 2 tablets by mouth 2 (two) times daily for 5 days. (Take nirmatrelvir 150 mg one tablet  twice daily for 5 days and ritonavir 100 mg one tablet twice daily for 5 days) Patient GFR is 52.    Dispense:  20 tablet    Refill:  0    Other meds: Amlodipine, Lisinopril-HCTZ, Glipizide, Rybelsus, Metformin, Spironolactone     Note: This dictation was prepared with Dragon dictation along with smaller phrase technology. Similar sounding words can be transcribed inadequately or may not be corrected upon review. Any transcriptional errors that result from this process are unintentional.      Disposition:  Follow up  Signed, Lindell Spar, Thompson  04/12/2021 4:41 PM     Jud

## 2021-04-13 ENCOUNTER — Encounter: Payer: Self-pay | Admitting: Internal Medicine

## 2021-04-14 ENCOUNTER — Other Ambulatory Visit: Payer: Self-pay

## 2021-04-14 ENCOUNTER — Encounter (HOSPITAL_COMMUNITY): Payer: Self-pay | Admitting: Emergency Medicine

## 2021-04-14 ENCOUNTER — Emergency Department (HOSPITAL_COMMUNITY)
Admission: EM | Admit: 2021-04-14 | Discharge: 2021-04-15 | Disposition: A | Discharge status: Home / Self Care | Payer: BC Managed Care – PPO | Attending: Emergency Medicine | Admitting: Emergency Medicine

## 2021-04-14 DIAGNOSIS — I10 Essential (primary) hypertension: Secondary | ICD-10-CM | POA: Diagnosis not present

## 2021-04-14 DIAGNOSIS — Z7984 Long term (current) use of oral hypoglycemic drugs: Secondary | ICD-10-CM | POA: Insufficient documentation

## 2021-04-14 DIAGNOSIS — Z79899 Other long term (current) drug therapy: Secondary | ICD-10-CM | POA: Diagnosis not present

## 2021-04-14 DIAGNOSIS — J189 Pneumonia, unspecified organism: Secondary | ICD-10-CM | POA: Diagnosis not present

## 2021-04-14 DIAGNOSIS — R059 Cough, unspecified: Secondary | ICD-10-CM | POA: Diagnosis not present

## 2021-04-14 DIAGNOSIS — E1159 Type 2 diabetes mellitus with other circulatory complications: Secondary | ICD-10-CM | POA: Insufficient documentation

## 2021-04-14 DIAGNOSIS — U071 COVID-19: Secondary | ICD-10-CM | POA: Insufficient documentation

## 2021-04-14 DIAGNOSIS — R519 Headache, unspecified: Secondary | ICD-10-CM | POA: Diagnosis not present

## 2021-04-14 DIAGNOSIS — Z794 Long term (current) use of insulin: Secondary | ICD-10-CM | POA: Insufficient documentation

## 2021-04-14 LAB — COMPREHENSIVE METABOLIC PANEL
ALT: 23 U/L (ref 0–44)
AST: 19 U/L (ref 15–41)
Albumin: 3.9 g/dL (ref 3.5–5.0)
Alkaline Phosphatase: 50 U/L (ref 38–126)
Anion gap: 10 (ref 5–15)
BUN: 19 mg/dL (ref 6–20)
CO2: 22 mmol/L (ref 22–32)
Calcium: 9 mg/dL (ref 8.9–10.3)
Chloride: 97 mmol/L — ABNORMAL LOW (ref 98–111)
Creatinine, Ser: 1.21 mg/dL — ABNORMAL HIGH (ref 0.44–1.00)
GFR, Estimated: 55 mL/min — ABNORMAL LOW (ref >60)
Glucose, Bld: 204 mg/dL — ABNORMAL HIGH (ref 70–99)
Potassium: 4.7 mmol/L (ref 3.5–5.1)
Sodium: 129 mmol/L — ABNORMAL LOW (ref 135–145)
Total Bilirubin: 0.5 mg/dL (ref 0.3–1.2)
Total Protein: 7.5 g/dL (ref 6.5–8.1)

## 2021-04-14 LAB — CBC WITH DIFFERENTIAL/PLATELET
Abs Immature Granulocytes: 0.03 10*3/uL (ref 0.00–0.07)
Basophils Absolute: 0 10*3/uL (ref 0.0–0.1)
Basophils Relative: 0 %
Eosinophils Absolute: 0.1 10*3/uL (ref 0.0–0.5)
Eosinophils Relative: 2 %
HCT: 36.1 % (ref 36.0–46.0)
Hemoglobin: 11.6 g/dL — ABNORMAL LOW (ref 12.0–15.0)
Immature Granulocytes: 0 %
Lymphocytes Relative: 24 %
Lymphs Abs: 1.9 10*3/uL (ref 0.7–4.0)
MCH: 28.2 pg (ref 26.0–34.0)
MCHC: 32.1 g/dL (ref 30.0–36.0)
MCV: 87.8 fL (ref 80.0–100.0)
Monocytes Absolute: 0.8 10*3/uL (ref 0.1–1.0)
Monocytes Relative: 10 %
Neutro Abs: 5.1 10*3/uL (ref 1.7–7.7)
Neutrophils Relative %: 64 %
Platelets: 253 10*3/uL (ref 150–400)
RBC: 4.11 MIL/uL (ref 3.87–5.11)
RDW: 12.7 % (ref 11.5–15.5)
WBC: 8.1 10*3/uL (ref 4.0–10.5)
nRBC: 0 % (ref 0.0–0.2)

## 2021-04-14 MED ORDER — SODIUM CHLORIDE 0.9 % IV BOLUS
1000.0000 mL | Freq: Once | INTRAVENOUS | Status: AC
  Administered 2021-04-14: 1000 mL via INTRAVENOUS

## 2021-04-14 NOTE — ED Triage Notes (Signed)
Pt c/o weakness, near syncope, and cramps today. Pt has + covid test on 04/12/21. Pt states that she thinks she may be dehydrated

## 2021-04-14 NOTE — ED Provider Notes (Signed)
[facility] EMERGENCY DEPARTMENT Provider Note   CSN: [id number] Arrival date & time: 04/14/21  2156     History Chief Complaint  Patient presents with   Weakness    Glenda Thompson is a 50 y.o. female.  Patient presents to the emergency department with generalized weakness.  Patient has been ill this week.  She reports that 2 days ago she started having sinus pressure, headache, chills and felt weak.  She noticed a scratchy throat.  She took a home COVID test and it was positive.  She has continue with those symptoms since then but then tonight started feeling increased weakness.  She thought her blood pressure might be low.  She drank some salt water and then checked her blood pressure, it was 449 systolic.  Patient is concerned she might be dehydrated.  She had some mild diarrhea the other day, no nausea or vomiting.       Past Medical History:  Diagnosis Date   Arthritis    Diabetes mellitus    Diabetes mellitus without complication ([place])    Phreesia 01/19/2021   Hyperlipidemia    Hypertension     Patient Active Problem List   Diagnosis Date Noted   Neck mass 03/02/2021   Encounter for examination following treatment at hospital 01/28/2021   Nerve pain 08/28/2020   Back pain, thoracic 03/06/2019   Essential hypertension, benign 08/09/2018   Vitamin D deficiency 10/13/2011   FATIGUE 09/24/2008   Mixed hyperlipidemia 09/06/2008   Type 2 diabetes mellitus with vascular disease ([place]) 05/29/2008   Morbid obesity ([place]) 05/29/2008    Past Surgical History:  Procedure Laterality Date   BIOPSY  01/22/2021   Procedure: BIOPSY;  Surgeon: Harvel Quale, MD;  [facility]: [facility];  Service: Gastroenterology;;   CESAREAN SECTION     COLONOSCOPY  01/22/2021   COLONOSCOPY WITH PROPOFOL N/A 01/22/2021   Procedure: COLONOSCOPY WITH PROPOFOL;  Surgeon: Harvel Quale, MD;  [facility]: [facility];  Service: Gastroenterology;  Laterality: N/A;  Am     OB  History   No obstetric history on file.     Family History  Problem Relation Age of Onset   Hypertension Mother    Diabetes Mother    Hyperlipidemia Mother    Diabetes Brother     Social History   Tobacco Use   Smoking status: Never   Smokeless tobacco: Never  Vaping Use   Vaping Use: Never used  Substance Use Topics   Alcohol use: Never   Drug use: Never    Home Medications Prior to Admission medications   Medication Sig Start Date End Date Taking? Authorizing Provider  amLODipine (NORVASC) 10 MG tablet TAKE 1 TABLET(10 MG) BY MOUTH DAILY Patient taking differently: Take 10 mg by mouth daily. 12/09/20   Fayrene Helper, MD  blood glucose meter kit and supplies Three times daily dx E11.65 Freestyle Lite 01/28/20   Fayrene Helper, MD  fluticasone [facility]) 50 MCG/ACT nasal spray Place 2 sprays into both nostrils daily. Patient taking differently: Place 2 sprays into both nostrils daily as needed for allergies. 03/06/19   Fayrene Helper, MD  glipiZIDE (GLUCOTROL XL) 5 MG 24 hr tablet TAKE 1 TABLET BY MOUTH EVERY DAY WITH BREAKFAST Patient taking differently: Take 5 mg by mouth daily with breakfast. 12/07/20   Fayrene Helper, MD  glucose blood (FREESTYLE LITE) test strip USE TO TEST THREE TIMES DAILY 08/30/18   Cassandria Anger, MD  Insulin Pen  Needle (B-D ULTRAFINE III SHORT PEN) 31G X 8 MM MISC Use as directed to inject insulin daily. 06/28/18   Fayrene Helper, MD  Lancets (FREESTYLE) lancets USE TO OBTAIN A BLOOD SPECIMEN TO TEST BLOOD SUGAR TWICE A DAY 02/08/19   Fayrene Helper, MD  lisinopril-hydrochlorothiazide (ZESTORETIC) 20-12.5 MG tablet TAKE 2 TABLETS BY MOUTH DAILY 01/13/21   Fayrene Helper, MD  metFORMIN (GLUCOPHAGE-XR) 500 MG 24 hr tablet TAKE 1 TABLET BY MOUTH TWICE A DAY Patient taking differently: Take 500 mg by mouth 2 (two) times daily. 11/04/20   Fayrene Helper, MD  Multiple Vitamin (MULTIVITAMIN WITH MINERALS) TABS tablet  Take 1 tablet by mouth daily.    [provider]  nirmatrelvir/ritonavir EUA, renal dosing, (PAXLOVID) TABS Take 2 tablets by mouth 2 (two) times daily for 5 days. (Take nirmatrelvir 150 mg one tablet twice daily for 5 days and ritonavir 100 mg one tablet twice daily for 5 days) Patient GFR is 52. 04/12/21 04/17/21  Lindell Spar, MD  phentermine (ADIPEX-P) 37.5 MG tablet Take half tablet by mouth once daily 03/02/21   Fayrene Helper, MD  rosuvastatin (CRESTOR) 5 MG tablet TAKE 1 TABLET BY MOUTH EVERY DAY Patient taking differently: Take 5 mg by mouth daily. 11/04/20   Fayrene Helper, MD  RYBELSUS 7 MG TABS TAKE 1 TABLET BY MOUTH EVERY DAY 03/02/21   Lindell Spar, MD  spironolactone (ALDACTONE) 50 MG tablet Take 1 tablet (50 mg total) by mouth daily. 10/21/20   Fayrene Helper, MD    Allergies    Patient has no known allergies.  Review of Systems   Review of Systems  Constitutional:  Positive for chills and fatigue.  HENT:  Positive for congestion and sore throat.   Respiratory:  Negative for shortness of breath.   Cardiovascular:  Negative for chest pain.  Neurological:  Positive for headaches.  All other systems reviewed and are negative.  Physical Exam Updated Vital Signs BP 127/86   Pulse 94   Temp 98.5 F (36.9 C)   Resp 17   Ht '5\' 5"'  (1.651 m)   Wt 112.9 kg   LMP 11/02/2018   SpO2 98%   BMI 41.42 kg/m   Physical Exam Vitals and nursing note reviewed.  Constitutional:      General: She is not in acute distress.    Appearance: Normal appearance. She is well-developed.  HENT:     Head: Normocephalic and atraumatic.     Right Ear: Hearing normal.     Left Ear: Hearing normal.     Nose: Nose normal.  Eyes:     Conjunctiva/sclera: Conjunctivae normal.     Pupils: Pupils are equal, round, and reactive to light.  Cardiovascular:     Rate and Rhythm: Regular rhythm.     Heart sounds: S1 normal and S2 normal. No murmur heard.   No friction rub. No  gallop.  Pulmonary:     Effort: Pulmonary effort is normal. No respiratory distress.     Breath sounds: Normal breath sounds.  Chest:     Chest wall: No tenderness.  Abdominal:     General: Bowel sounds are normal.     Palpations: Abdomen is soft.     Tenderness: There is no abdominal tenderness. There is no guarding or rebound. Negative signs include Murphy's sign and McBurney's sign.     Hernia: No hernia is present.  Musculoskeletal:        General: Normal range  of motion.     Cervical back: Normal range of motion and neck supple.  Skin:    General: Skin is warm and dry.     Findings: No rash.  Neurological:     Mental Status: She is alert and oriented to person, place, and time.     GCS: GCS eye subscore is 4. GCS verbal subscore is 5. GCS motor subscore is 6.     Cranial Nerves: No cranial nerve deficit.     Sensory: No sensory deficit.     Coordination: Coordination normal.  Psychiatric:        Speech: Speech normal.        Behavior: Behavior normal.        Thought Content: Thought content normal.    ED Results / Procedures / Treatments   Labs (all labs ordered are listed, but only abnormal results are displayed) Labs Reviewed  CBC WITH DIFFERENTIAL/PLATELET - Abnormal; Notable for the following components:      Result Value   Hemoglobin 11.6 (*)    All other components within normal limits  COMPREHENSIVE METABOLIC PANEL - Abnormal; Notable for the following components:   Sodium 129 (*)    Chloride 97 (*)    Glucose, Bld 204 (*)    Creatinine, Ser 1.21 (*)    GFR, Estimated 55 (*)    All other components within normal limits    EKG None  Radiology No results found.  Procedures Procedures   Medications Ordered in ED Medications  sodium chloride 0.9 % bolus 1,000 mL (0 mLs Intravenous Stopped 04/15/21 0136)    ED Course  I have reviewed the triage vital signs and the nursing notes.  Pertinent labs & imaging results that were available during my care  of the patient were reviewed by me and considered in my medical decision making (see chart for details).    MDM Rules/Calculators/A&P                          Patient presents to the emergency department for evaluation of generalized weakness and COVID symptoms for 2 days.  Patient feels dehydrated.  Lab work is reassuring.  Given IV fluids.  Chest x-ray clear.  Patient in no respiratory distress.  No need for admission.  She is already prescribed Paxlovid.  Final Clinical Impression(s) / ED Diagnoses Final diagnoses:  COVID-19    Rx / DC Orders ED Discharge Orders     None        Natasja Niday, Gwenyth Allegra, MD 04/15/21 0200

## 2021-04-15 ENCOUNTER — Telehealth: Payer: Self-pay

## 2021-04-15 ENCOUNTER — Emergency Department (HOSPITAL_COMMUNITY): Payer: BC Managed Care – PPO

## 2021-04-15 DIAGNOSIS — J189 Pneumonia, unspecified organism: Secondary | ICD-10-CM | POA: Diagnosis not present

## 2021-04-15 DIAGNOSIS — R059 Cough, unspecified: Secondary | ICD-10-CM | POA: Diagnosis not present

## 2021-04-15 NOTE — Telephone Encounter (Signed)
Cathy from [facility] called patient has an interaction with medication that was prescribed to her yesterday for her covid with her blood pressure medicine, please return call to cathy at [phone].

## 2021-04-15 NOTE — Telephone Encounter (Signed)
Cathy at [facility] notified with verbal understanding

## 2021-04-16 ENCOUNTER — Encounter: Payer: Self-pay | Admitting: Internal Medicine

## 2021-04-19 ENCOUNTER — Telehealth: Payer: Self-pay | Admitting: *Deleted

## 2021-04-19 NOTE — Telephone Encounter (Signed)
I put a copy of FMLA in Dr Luther Parody box to make change and write out full name and date correction

## 2021-04-19 NOTE — Telephone Encounter (Signed)
Pt stated she needs you to write your whole name and date it so that they are aware instead of initials

## 2021-04-19 NOTE — Telephone Encounter (Signed)
Pt called and stated that the FMLA that was filled out in March was initialed by [facility] and they told her they could not accept this as it looked falsified. This need to be redone.

## 2021-04-19 NOTE — Telephone Encounter (Signed)
Please advise 

## 2021-04-19 NOTE — Telephone Encounter (Signed)
Pt was taken out of work by dr patel for covid she was taken out of work until 04-19-21 and she is still testing positive. She cannot go back to work until she test negative and was wondering if this work could be extended for a week. Please advise

## 2021-04-19 NOTE — Telephone Encounter (Signed)
Is she going to bring another form or have it faxed

## 2021-04-20 NOTE — Telephone Encounter (Signed)
Pt will get husband to come by and pick it up

## 2021-04-20 NOTE — Telephone Encounter (Signed)
I have her note printed for another week to return 7/4. Does she need it faxed somewhere?

## 2021-04-21 ENCOUNTER — Encounter: Payer: Self-pay | Admitting: Family Medicine

## 2021-04-21 ENCOUNTER — Ambulatory Visit: Payer: BC Managed Care – PPO | Admitting: Family Medicine

## 2021-04-23 ENCOUNTER — Telehealth: Payer: Self-pay

## 2021-04-23 NOTE — Telephone Encounter (Signed)
FMLA  FMLA (Husband) STD  Copied Noted Sleeved

## 2021-05-01 ENCOUNTER — Other Ambulatory Visit: Payer: Self-pay | Admitting: Family Medicine

## 2021-05-06 DIAGNOSIS — Z0279 Encounter for issue of other medical certificate: Secondary | ICD-10-CM

## 2021-05-09 ENCOUNTER — Other Ambulatory Visit: Payer: Self-pay | Admitting: Family Medicine

## 2021-05-10 NOTE — Telephone Encounter (Signed)
Patient informed forms ready for pick up. Copied/ scanned

## 2021-05-12 ENCOUNTER — Ambulatory Visit: Payer: BC Managed Care – PPO | Admitting: Family Medicine

## 2021-05-27 ENCOUNTER — Ambulatory Visit: Payer: BC Managed Care – PPO | Admitting: Family Medicine

## 2021-05-31 ENCOUNTER — Ambulatory Visit (HOSPITAL_COMMUNITY)
Admission: RE | Admit: 2021-05-31 | Discharge: 2021-05-31 | Disposition: A | Discharge status: Home / Self Care | Payer: BC Managed Care – PPO | Source: Ambulatory Visit | Attending: Family Medicine | Admitting: Family Medicine

## 2021-05-31 ENCOUNTER — Other Ambulatory Visit: Payer: Self-pay

## 2021-05-31 DIAGNOSIS — Z1231 Encounter for screening mammogram for malignant neoplasm of breast: Secondary | ICD-10-CM | POA: Insufficient documentation

## 2021-06-23 ENCOUNTER — Ambulatory Visit: Payer: BC Managed Care – PPO | Admitting: Family Medicine

## 2021-06-23 ENCOUNTER — Other Ambulatory Visit: Payer: Self-pay

## 2021-06-23 ENCOUNTER — Encounter: Payer: Self-pay | Admitting: Family Medicine

## 2021-06-23 ENCOUNTER — Other Ambulatory Visit: Payer: Self-pay | Admitting: Family Medicine

## 2021-06-23 VITALS — BP 134/68 | HR 83 | Resp 16 | Ht 65.0 in | Wt 246.0 lb

## 2021-06-23 DIAGNOSIS — E1159 Type 2 diabetes mellitus with other circulatory complications: Secondary | ICD-10-CM

## 2021-06-23 DIAGNOSIS — E782 Mixed hyperlipidemia: Secondary | ICD-10-CM

## 2021-06-23 DIAGNOSIS — I10 Essential (primary) hypertension: Secondary | ICD-10-CM

## 2021-06-23 DIAGNOSIS — Z23 Encounter for immunization: Secondary | ICD-10-CM | POA: Diagnosis not present

## 2021-06-23 LAB — POCT GLYCOSYLATED HEMOGLOBIN (HGB A1C): Hemoglobin A1C: 7.2 % — AB (ref 4.0–5.6)

## 2021-06-23 MED ORDER — GLIPIZIDE ER 2.5 MG PO TB24: 2.5000 mg | ORAL_TABLET | Freq: Every day | ORAL | 3 refills | Status: DC

## 2021-06-23 MED ORDER — RYBELSUS 14 MG PO TABS: ORAL_TABLET | ORAL | 3 refills | Status: DC

## 2021-06-23 NOTE — Patient Instructions (Addendum)
F/U in 13 weeks, call if you need me sooner, shingrix #2 at visit  Flu vaccine today and microalb if able, if not then for next visit with other labs  Nurse visit in 3 to 4 weeks for shingrix #1  Please get your eye exam, this is past due  Fasting lipid, cmp and EGFR and hBA1C 3  to 5 days before next appt  New higher dose of rybelsus is 14 mg once daily  New lower dose of glipizide is 2.5 mg one daily  Work on controlling dinner meal  It is important that you exercise regularly at least 30 minutes 5 times a week. If you develop chest pain, have severe difficulty breathing, or feel very tired, stop exercising immediately and seek medical attention   Commit to 64 ounces water daily, 2/3 of food as vegetables, daily metamucil and daily stool softener  Some of the constipation is a s/e of your medication  Good foot exam  Thanks for choosing [facility], we consider it a privelige to serve you.

## 2021-06-24 ENCOUNTER — Encounter: Payer: Self-pay | Admitting: Family Medicine

## 2021-06-24 NOTE — Progress Notes (Signed)
Glenda DEUTSCHER     MRN: [id number]      DOB: 1971/05/30   HPI Glenda Thompson is here for follow up and re-evaluation of chronic medical conditions, medication management and review of any available recent lab and radiology data.  Preventive health is updated, specifically  Cancer screening and Immunization.   Questions or concerns regarding consultations or procedures which the PT has had in the interim are  addressed. The PT denies any adverse reactions to current medications since the last visit.  There are no new concerns.  There are no specific complaints   ROS Denies recent fever or chills. Denies sinus pressure, nasal congestion, ear pain or sore throat. Denies chest congestion, productive cough or wheezing. Denies chest pains, palpitations and leg swelling Denies abdominal pain, nausea, vomiting,diarrhea or constipation.   Denies dysuria, frequency, hesitancy or incontinence. Denies joint pain, swelling and limitation in mobility. Denies headaches, seizures, numbness, or tingling. Denies depression, anxiety or insomnia. Denies skin break down or rash.   PE  BP 134/68   Pulse 83   Resp 16   Ht 5\' 5"  (1.651 m)   Wt 246 lb (111.6 kg)   LMP 11/02/2018   SpO2 98%   BMI 40.94 kg/m   Patient alert and oriented and in no cardiopulmonary distress.  HEENT: No facial asymmetry, EOMI,     Neck supple .  Chest: Clear to auscultation bilaterally.  CVS: S1, S2 no murmurs, no S3.Regular rate.  ABD: Soft non tender.   Ext: No edema  MS: Adequate ROM spine, shoulders, hips and knees.  Skin: Intact, no ulcerations or rash noted.  Psych: Good eye contact, normal affect. Memory intact not anxious or depressed appearing.  CNS: CN 2-12 intact, power,  normal throughout.no focal deficits noted.   Assessment & Plan  Type 2 diabetes mellitus with vascular disease [facility]) Glenda Thompson is reminded of the importance of commitment to daily physical activity for 30 minutes or  more, as able and the need to limit carbohydrate intake to 30 to 60 grams per meal to help with blood sugar control.   The need to take medication as prescribed, test blood sugar as directed, and to call between visits if there is a concern that blood sugar is uncontrolled is also discussed.   Glenda Thompson is reminded of the importance of daily foot exam, annual eye examination, and good blood sugar, blood pressure and cholesterol control. Inc rybelsus dose and reduce glipizide, f/u in 13 weeks Diabetic Labs Latest Ref Rng & Units 06/23/2021 04/14/2021 03/02/2021 01/26/2021 01/26/2021  HbA1c 4.0 - 5.6 % 7.2(A) - - - -  Microalbumin mg/dL - - - - -  Micro/Creat Ratio <30 mcg/mg creat - - - - -  Chol 100 - 199 mg/dL - - - - -  HDL 03/28/2021 mg/dL - - - - -  Calc LDL 0 - 99 mg/dL - - - - -  Triglycerides 0 - 149 mg/dL - - - - -  Creatinine >03 - 1.00 mg/dL - 8.88) 2.80(K) 3.49(Z) 1.92(H)   BP/Weight 06/23/2021 04/15/2021 04/14/2021 03/02/2021 01/28/2021 01/26/2021 01/22/2021  Systolic BP 134 127 - 137 126 03/24/2021 101  Diastolic BP 68 86 - 83 79 78 49  Wt. (Lbs) 246 - 248.9 249 251.8 250 -  BMI 40.94 - 41.42 41.44 41.9 42.91 -   Foot/eye exam completion dates Latest Ref Rng & Units 06/23/2021 06/22/2020  Eye Exam No Retinopathy - -  Foot Form Completion - Done Done  Morbid obesity  Patient re-educated about  the importance of commitment to a  minimum of 150 minutes of exercise per week as able.  The importance of healthy food choices with portion control discussed, as well as eating regularly and within a 12 hour window most days. The need to choose "clean , green" food 50 to 75% of the time is discussed, as well as to make water the primary drink and set a goal of 64 ounces water daily.    Weight /BMI 06/23/2021 04/14/2021 03/02/2021  WEIGHT 246 lb 248 lb 14.4 oz 249 lb  HEIGHT 5\' 5"  5\' 5"  5\' 5"   BMI 40.94 kg/m2 41.42 kg/m2 41.44 kg/m2      Mixed hyperlipidemia Hyperlipidemia:Low fat diet  discussed and encouraged.   Lipid Panel  Lab Results  Component Value Date   CHOL 184 11/24/2020   HDL 66 11/24/2020   LDLCALC 99 11/24/2020   TRIG 109 11/24/2020   CHOLHDL 2.8 11/24/2020   Controlled, no change in medication Updated lab needed at/ before next visit.    Essential hypertension, benign Controlled, no change in medication DASH diet and commitment to daily physical activity for a minimum of 30 minutes discussed and encouraged, as a part of hypertension management. The importance of attaining a healthy weight is also discussed.  BP/Weight 06/23/2021 04/15/2021 04/14/2021 03/02/2021 01/28/2021 01/26/2021 01/22/2021  Systolic BP 134 127 - 137 126 03/30/2021 101  Diastolic BP 68 86 - 83 79 78 49  Wt. (Lbs) 246 - 248.9 249 251.8 250 -  BMI 40.94 - 41.42 41.44 41.9 42.91 -

## 2021-06-24 NOTE — Assessment & Plan Note (Signed)
Glenda Thompson is reminded of the importance of commitment to daily physical activity for 30 minutes or more, as able and the need to limit carbohydrate intake to 30 to 60 grams per meal to help with blood sugar control.   The need to take medication as prescribed, test blood sugar as directed, and to call between visits if there is a concern that blood sugar is uncontrolled is also discussed.   Glenda Thompson is reminded of the importance of daily foot exam, annual eye examination, and good blood sugar, blood pressure and cholesterol control. Inc rybelsus dose and reduce glipizide, f/u in 13 weeks Diabetic Labs Latest Ref Rng & Units 06/23/2021 04/14/2021 03/02/2021 01/26/2021 01/26/2021  HbA1c 4.0 - 5.6 % 7.2(A) - - - -  Microalbumin mg/dL - - - - -  Micro/Creat Ratio <30 mcg/mg creat - - - - -  Chol 100 - 199 mg/dL - - - - -  HDL >56 mg/dL - - - - -  Calc LDL 0 - 99 mg/dL - - - - -  Triglycerides 0 - 149 mg/dL - - - - -  Creatinine 2.56 - 1.00 mg/dL - 3.89(H) 7.34(K) 8.76(O) 1.92(H)   BP/Weight 06/23/2021 04/15/2021 04/14/2021 03/02/2021 01/28/2021 01/26/2021 01/22/2021  Systolic BP 134 127 - 137 126 115 101  Diastolic BP 68 86 - 83 79 78 49  Wt. (Lbs) 246 - 248.9 249 251.8 250 -  BMI 40.94 - 41.42 41.44 41.9 42.91 -   Foot/eye exam completion dates Latest Ref Rng & Units 06/23/2021 06/22/2020  Eye Exam No Retinopathy - -  Foot Form Completion - Done Done

## 2021-06-24 NOTE — Assessment & Plan Note (Signed)
  Patient re-educated about  the importance of commitment to a  minimum of 150 minutes of exercise per week as able.  The importance of healthy food choices with portion control discussed, as well as eating regularly and within a 12 hour window most days. The need to choose "clean , green" food 50 to 75% of the time is discussed, as well as to make water the primary drink and set a goal of 64 ounces water daily.    Weight /BMI 06/23/2021 04/14/2021 03/02/2021  WEIGHT 246 lb 248 lb 14.4 oz 249 lb  HEIGHT 5\' 5"  5\' 5"  5\' 5"   BMI 40.94 kg/m2 41.42 kg/m2 41.44 kg/m2

## 2021-06-24 NOTE — Assessment & Plan Note (Signed)
Controlled, no change in medication DASH diet and commitment to daily physical activity for a minimum of 30 minutes discussed and encouraged, as a part of hypertension management. The importance of attaining a healthy weight is also discussed.  BP/Weight 06/23/2021 04/15/2021 04/14/2021 03/02/2021 01/28/2021 01/26/2021 01/22/2021  Systolic BP 134 127 - 137 126 947 101  Diastolic BP 68 86 - 83 79 78 49  Wt. (Lbs) 246 - 248.9 249 251.8 250 -  BMI 40.94 - 41.42 41.44 41.9 42.91 -

## 2021-06-24 NOTE — Assessment & Plan Note (Signed)
Hyperlipidemia:Low fat diet discussed and encouraged.   Lipid Panel  Lab Results  Component Value Date   CHOL 184 11/24/2020   HDL 66 11/24/2020   LDLCALC 99 11/24/2020   TRIG 109 11/24/2020   CHOLHDL 2.8 11/24/2020   Controlled, no change in medication Updated lab needed at/ before next visit.

## 2021-06-26 ENCOUNTER — Other Ambulatory Visit: Payer: Self-pay | Admitting: Internal Medicine

## 2021-06-26 DIAGNOSIS — E1159 Type 2 diabetes mellitus with other circulatory complications: Secondary | ICD-10-CM

## 2021-07-28 ENCOUNTER — Telehealth: Payer: Self-pay | Admitting: Family Medicine

## 2021-07-28 NOTE — Telephone Encounter (Signed)
Did not see on the current or past med list. Called pt to find out what this was for and where she had gotten it originally. LVM to call back

## 2021-07-28 NOTE — Telephone Encounter (Signed)
Pt needs a refill on this Triamcinolone- acetonide  Please call the pt

## 2021-08-02 NOTE — Telephone Encounter (Signed)
Called patient and left message for them to return call at the office   

## 2021-08-04 ENCOUNTER — Ambulatory Visit (INDEPENDENT_AMBULATORY_CARE_PROVIDER_SITE_OTHER): Payer: BC Managed Care – PPO

## 2021-08-04 ENCOUNTER — Other Ambulatory Visit: Payer: Self-pay

## 2021-08-04 DIAGNOSIS — Z23 Encounter for immunization: Secondary | ICD-10-CM

## 2021-08-14 ENCOUNTER — Other Ambulatory Visit: Payer: Self-pay | Admitting: Family Medicine

## 2021-08-27 ENCOUNTER — Other Ambulatory Visit: Payer: Self-pay | Admitting: Family Medicine

## 2021-09-08 ENCOUNTER — Telehealth: Payer: Self-pay | Admitting: Family Medicine

## 2021-09-08 NOTE — Telephone Encounter (Signed)
Pt called in regards to note for work .  Pt states that job offers Diabetic boots but pt needs a note stating that she is diabetic to qualify for the boots . Note can be faxed to office.  If unable to get boots from work or they are not a good fit for her pt is also requesting a prescription for diabetic shoes as well     Work fax : [phone]

## 2021-09-08 NOTE — Telephone Encounter (Signed)
Left message that letter was faxed to her job

## 2021-09-14 ENCOUNTER — Ambulatory Visit: Payer: BC Managed Care – PPO | Admitting: Family Medicine

## 2021-09-14 ENCOUNTER — Other Ambulatory Visit: Payer: Self-pay

## 2021-09-14 ENCOUNTER — Encounter: Payer: Self-pay | Admitting: Family Medicine

## 2021-09-14 VITALS — BP 130/70 | HR 82 | Resp 16 | Ht 65.0 in | Wt 246.1 lb

## 2021-09-14 DIAGNOSIS — K5903 Drug induced constipation: Secondary | ICD-10-CM

## 2021-09-14 DIAGNOSIS — E1159 Type 2 diabetes mellitus with other circulatory complications: Secondary | ICD-10-CM

## 2021-09-14 DIAGNOSIS — E782 Mixed hyperlipidemia: Secondary | ICD-10-CM

## 2021-09-14 DIAGNOSIS — I10 Essential (primary) hypertension: Secondary | ICD-10-CM | POA: Diagnosis not present

## 2021-09-14 DIAGNOSIS — Z23 Encounter for immunization: Secondary | ICD-10-CM

## 2021-09-14 NOTE — Patient Instructions (Addendum)
F/U end January, re evaluate blood pressure call if you need me sooner  Pneumonia 20 today  Fasting labs and urine the week of Dec 12, already ordered  Commit to 2 stool softeners and daily miralax, also take medicine for consipation every 3 days if no bM, like dulcolax or milk of magnesia  Increase water , 64 ounces per day and keep active   Thanks for choosing [facility], we consider it a privelige to serve you.

## 2021-09-20 ENCOUNTER — Encounter: Payer: Self-pay | Admitting: Family Medicine

## 2021-09-20 DIAGNOSIS — K5900 Constipation, unspecified: Secondary | ICD-10-CM | POA: Insufficient documentation

## 2021-09-20 NOTE — Assessment & Plan Note (Signed)
  Patient re-educated about  the importance of commitment to a  minimum of 150 minutes of exercise per week as able.  The importance of healthy food choices with portion control discussed, as well as eating regularly and within a 12 hour window most days. The need to choose "clean , green" food 50 to 75% of the time is discussed, as well as to make water the primary drink and set a goal of 64 ounces water daily.    Weight /BMI 09/14/2021 06/23/2021 04/14/2021  WEIGHT 246 lb 1.9 oz 246 lb 248 lb 14.4 oz  HEIGHT 5\' 5"  5\' 5"  5\' 5"   BMI 40.96 kg/m2 40.94 kg/m2 41.42 kg/m2    Unchanged

## 2021-09-20 NOTE — Progress Notes (Signed)
Glenda Thompson     MRN: [id number]      DOB: Oct 21, 1971   HPI Glenda Thompson is here for follow up and re-evaluation of chronic medical conditions, medication management and review of any available recent lab and radiology data.  Preventive health is updated, specifically  Cancer screening and Immunization.   Questions or concerns regarding consultations or procedures which the PT has had in the interim are  addressed. The PT denies any adverse reactions to current medications since the last visit.  C/o severe constipation on avg every 2 weeks, no N/V Denies polyuria, polydipsia, blurred vision , or hypoglycemic episodes.    ROS Denies recent fever or chills. Denies sinus pressure, nasal congestion, ear pain or sore throat. Denies chest congestion, productive cough or wheezing. Denies chest pains, palpitations and leg swelling  Denies dysuria, frequency, hesitancy or incontinence. Denies joint pain, swelling and limitation in mobility. Denies headaches, seizures, numbness, or tingling. Denies depression, anxiety or insomnia. Denies skin break down or rash.   PE  BP 130/70   Pulse 82   Resp 16   Ht 5\' 5"  (1.651 m)   Wt 246 lb 1.9 oz (111.6 kg)   LMP 11/02/2018   SpO2 98%   BMI 40.96 kg/m    Patient alert and oriented and in no cardiopulmonary distress.  HEENT: No facial asymmetry, EOMI,     Neck supple .  Chest: Clear to auscultation bilaterally.  CVS: S1, S2 no murmurs, no S3.Regular rate.  ABD: Soft non tender.   Ext: No edema  MS: Adequate ROM spine, shoulders, hips and knees.  Skin: Intact, no ulcerations or rash noted.  Psych: Good eye contact, normal affect. Memory intact not anxious or depressed appearing.  CNS: CN 2-12 intact, power,  normal throughout.no focal deficits noted.   Assessment & Plan  Type 2 diabetes mellitus with vascular disease [facility]) Glenda Thompson is reminded of the importance of commitment to daily physical activity for 30 minutes  or more, as able and the need to limit carbohydrate intake to 30 to 60 grams per meal to help with blood sugar control.   The need to take medication as prescribed, test blood sugar as directed, and to call between visits if there is a concern that blood sugar is uncontrolled is also discussed.   Glenda Thompson is reminded of the importance of daily foot exam, annual eye examination, and good blood sugar, blood pressure and cholesterol control. Updated lab needed at/ before next visit.   Diabetic Labs Latest Ref Rng & Units 06/23/2021 04/14/2021 03/02/2021 01/26/2021 01/26/2021  HbA1c 4.0 - 5.6 % 7.2(A) - - - -  Microalbumin mg/dL - - - - -  Micro/Creat Ratio <30 mcg/mg creat - - - - -  Chol 100 - 199 mg/dL - - - - -  HDL 03/28/2021 mg/dL - - - - -  Calc LDL 0 - 99 mg/dL - - - - -  Triglycerides 0 - 149 mg/dL - - - - -  Creatinine >94 - 1.00 mg/dL - 8.54) 6.27(O) 3.50(K) 1.92(H)   BP/Weight 09/14/2021 06/23/2021 04/15/2021 04/14/2021 03/02/2021 01/28/2021 01/26/2021  Systolic BP 130 134 127 - 137 03/28/2021 145  Diastolic BP 70 68 86 - 83 79 78  Wt. (Lbs) 246.12 246 - 248.9 249 251.8 250  BMI 40.96 40.94 - 41.42 41.44 41.9 42.91   Foot/eye exam completion dates Latest Ref Rng & Units 06/23/2021 06/22/2020  Eye Exam No Retinopathy - -  Foot Form Completion -  Done Done        Essential hypertension, benign Controlled, no change in medication DASH diet and commitment to daily physical activity for a minimum of 30 minutes discussed and encouraged, as a part of hypertension management. The importance of attaining a healthy weight is also discussed.  BP/Weight 09/14/2021 06/23/2021 04/15/2021 04/14/2021 03/02/2021 01/28/2021 01/26/2021  Systolic BP 130 134 127 - 137 481 145  Diastolic BP 70 68 86 - 83 79 78  Wt. (Lbs) 246.12 246 - 248.9 249 251.8 250  BMI 40.96 40.94 - 41.42 41.44 41.9 42.91       Morbid obesity  Patient re-educated about  the importance of commitment to a  minimum of 150 minutes of exercise  per week as able.  The importance of healthy food choices with portion control discussed, as well as eating regularly and within a 12 hour window most days. The need to choose "clean , green" food 50 to 75% of the time is discussed, as well as to make water the primary drink and set a goal of 64 ounces water daily.    Weight /BMI 09/14/2021 06/23/2021 04/14/2021  WEIGHT 246 lb 1.9 oz 246 lb 248 lb 14.4 oz  HEIGHT 5\' 5"  5\' 5"  5\' 5"   BMI 40.96 kg/m2 40.94 kg/m2 41.42 kg/m2    Unchanged  Mixed hyperlipidemia Hyperlipidemia:Low fat diet discussed and encouraged.   Lipid Panel  Lab Results  Component Value Date   CHOL 184 11/24/2020   HDL 66 11/24/2020   LDLCALC 99 11/24/2020   TRIG 109 11/24/2020   CHOLHDL 2.8 11/24/2020     Updated lab needed at/ before next visit.   Constipation Start daily regime of sttol softener, fiber, water , 64 oz or  More and exercise , with laxative every 3 days if no BM, if unsuccesful will need prescription med for constipation, she will call back

## 2021-09-20 NOTE — Assessment & Plan Note (Signed)
Glenda Thompson is reminded of the importance of commitment to daily physical activity for 30 minutes or more, as able and the need to limit carbohydrate intake to 30 to 60 grams per meal to help with blood sugar control.   The need to take medication as prescribed, test blood sugar as directed, and to call between visits if there is a concern that blood sugar is uncontrolled is also discussed.   Glenda Thompson is reminded of the importance of daily foot exam, annual eye examination, and good blood sugar, blood pressure and cholesterol control. Updated lab needed at/ before next visit.   Diabetic Labs Latest Ref Rng & Units 06/23/2021 04/14/2021 03/02/2021 01/26/2021 01/26/2021  HbA1c 4.0 - 5.6 % 7.2(A) - - - -  Microalbumin mg/dL - - - - -  Micro/Creat Ratio <30 mcg/mg creat - - - - -  Chol 100 - 199 mg/dL - - - - -  HDL >60 mg/dL - - - - -  Calc LDL 0 - 99 mg/dL - - - - -  Triglycerides 0 - 149 mg/dL - - - - -  Creatinine 7.37 - 1.00 mg/dL - 1.06(Y) 6.94(W) 5.46(E) 1.92(H)   BP/Weight 09/14/2021 06/23/2021 04/15/2021 04/14/2021 03/02/2021 01/28/2021 01/26/2021  Systolic BP 130 134 127 - 137 703 145  Diastolic BP 70 68 86 - 83 79 78  Wt. (Lbs) 246.12 246 - 248.9 249 251.8 250  BMI 40.96 40.94 - 41.42 41.44 41.9 42.91   Foot/eye exam completion dates Latest Ref Rng & Units 06/23/2021 06/22/2020  Eye Exam No Retinopathy - -  Foot Form Completion - Done Done

## 2021-09-20 NOTE — Assessment & Plan Note (Signed)
Hyperlipidemia:Low fat diet discussed and encouraged.   Lipid Panel  Lab Results  Component Value Date   CHOL 184 11/24/2020   HDL 66 11/24/2020   LDLCALC 99 11/24/2020   TRIG 109 11/24/2020   CHOLHDL 2.8 11/24/2020     Updated lab needed at/ before next visit.

## 2021-09-20 NOTE — Assessment & Plan Note (Signed)
Controlled, no change in medication DASH diet and commitment to daily physical activity for a minimum of 30 minutes discussed and encouraged, as a part of hypertension management. The importance of attaining a healthy weight is also discussed.  BP/Weight 09/14/2021 06/23/2021 04/15/2021 04/14/2021 03/02/2021 01/28/2021 01/26/2021  Systolic BP 130 134 127 - 137 397 145  Diastolic BP 70 68 86 - 83 79 78  Wt. (Lbs) 246.12 246 - 248.9 249 251.8 250  BMI 40.96 40.94 - 41.42 41.44 41.9 42.91

## 2021-09-20 NOTE — Assessment & Plan Note (Signed)
Start daily regime of sttol softener, fiber, water , 64 oz or  More and exercise , with laxative every 3 days if no BM, if unsuccesful will need prescription med for constipation, she will call back

## 2021-09-27 ENCOUNTER — Encounter: Payer: Self-pay | Admitting: Family Medicine

## 2021-09-27 ENCOUNTER — Encounter: Payer: Self-pay | Admitting: Internal Medicine

## 2021-09-27 ENCOUNTER — Ambulatory Visit: Payer: BC Managed Care – PPO

## 2021-09-27 ENCOUNTER — Ambulatory Visit: Payer: BC Managed Care – PPO | Admitting: Internal Medicine

## 2021-09-27 ENCOUNTER — Telehealth: Payer: Self-pay

## 2021-09-27 ENCOUNTER — Other Ambulatory Visit: Payer: Self-pay

## 2021-09-27 ENCOUNTER — Encounter: Payer: Self-pay | Admitting: *Deleted

## 2021-09-27 DIAGNOSIS — R6889 Other general symptoms and signs: Secondary | ICD-10-CM

## 2021-09-27 DIAGNOSIS — J069 Acute upper respiratory infection, unspecified: Secondary | ICD-10-CM | POA: Diagnosis not present

## 2021-09-27 DIAGNOSIS — U071 COVID-19: Secondary | ICD-10-CM | POA: Diagnosis not present

## 2021-09-27 DIAGNOSIS — Z20822 Contact with and (suspected) exposure to covid-19: Secondary | ICD-10-CM | POA: Diagnosis not present

## 2021-09-27 LAB — POCT INFLUENZA A/B
Influenza A, POC: NEGATIVE
Influenza B, POC: NEGATIVE

## 2021-09-27 MED ORDER — NIRMATRELVIR/RITONAVIR (PAXLOVID) TABLET (RENAL DOSING): 2.0000 | ORAL_TABLET | Freq: Two times a day (BID) | ORAL | 0 refills | Status: AC

## 2021-09-27 MED ORDER — AZITHROMYCIN 250 MG PO TABS: ORAL_TABLET | ORAL | 0 refills | Status: DC

## 2021-09-27 NOTE — Addendum Note (Signed)
Addended byTrena Platt on: 09/27/2021 09:47 AM   Modules accepted: Orders

## 2021-09-27 NOTE — Addendum Note (Signed)
Addended by: Ishmael Holter R on: 09/27/2021 09:25 AM   Modules accepted: Orders

## 2021-09-27 NOTE — Telephone Encounter (Signed)
Work note provided for 09-27-21

## 2021-09-27 NOTE — Progress Notes (Addendum)
Virtual Visit via Telephone Note   This visit type was conducted due to national recommendations for restrictions regarding the COVID-19 Pandemic (e.g. social distancing) in an effort to limit this patient's exposure and mitigate transmission in our community.  Due to her co-morbid illnesses, this patient is at least at moderate risk for complications without adequate follow up.  This format is felt to be most appropriate for this patient at this time.  The patient did not have access to video technology/had technical difficulties with video requiring transitioning to audio format only (telephone).  All issues noted in this document were discussed and addressed.  No physical exam could be performed with this format.  Evaluation Performed:  Follow-up visit  Date:  09/27/2021   ID:  Glenda Thompson, DOB December 02, 1970, MRN [id number]  Patient Location: Home Provider Location: Office/Clinic  Participants: Patient Location of Patient: Home Location of Provider: Telehealth Consent was obtain for visit to be over via telehealth. I verified that I am speaking with the correct person using two identifiers.  PCP:  Fayrene Helper, MD   Chief Complaint: Cough, myalgias, nausea/vomiting  History of Present Illness:    Glenda Thompson is a 50 y.o. female who has a televisit for complaint of cough, myalgias, nausea/vomiting and diarrhea since yesterday.  She tested negative for COVID at home.  She has tried Mucinex and Robitussin with minimal relief.  She denies any dyspnea or wheezing currently.  The patient does have symptoms concerning for COVID-19 infection (fever, chills, cough, or new shortness of breath).   Past Medical, Surgical, Social History, Allergies, and Medications have been Reviewed.  Past Medical History:  Diagnosis Date   Arthritis    Diabetes mellitus    Diabetes mellitus without complication ([place])    Phreesia 01/19/2021   Hyperlipidemia    Hypertension    Past  Surgical History:  Procedure Laterality Date   BIOPSY  01/22/2021   Procedure: BIOPSY;  Surgeon: Harvel Quale, MD;  [facility];  Service: Gastroenterology;;   CESAREAN SECTION     COLONOSCOPY  01/22/2021   COLONOSCOPY WITH PROPOFOL N/A 01/22/2021   Procedure: COLONOSCOPY WITH PROPOFOL;  Surgeon: Harvel Quale, MD;  [facility];  Service: Gastroenterology;  Laterality: N/A;  Am     Current Meds  Medication Sig   amLODipine (NORVASC) 10 MG tablet TAKE 1 TABLET(10 MG) BY MOUTH DAILY   blood glucose meter kit and supplies Three times daily dx E11.65 Freestyle Lite   fluticasone (FLONASE) 50 MCG/ACT nasal spray SPRAY 2 SPRAYS INTO EACH NOSTRIL EVERY DAY   glipiZIDE (GLUCOTROL XL) 2.5 MG 24 hr tablet TAKE 1 TABLET BY MOUTH DAILY WITH BREAKFAST.   glucose blood (FREESTYLE LITE) test strip USE TO TEST THREE TIMES DAILY   Insulin Pen Needle (B-D ULTRAFINE III SHORT PEN) 31G X 8 MM MISC Use as directed to inject insulin daily.   Lancets (FREESTYLE) lancets USE TO OBTAIN A BLOOD SPECIMEN TO TEST BLOOD SUGAR TWICE A DAY   lisinopril-hydrochlorothiazide (ZESTORETIC) 20-12.5 MG tablet TAKE 2 TABLETS BY MOUTH DAILY   metFORMIN (GLUCOPHAGE-XR) 500 MG 24 hr tablet TAKE 1 TABLET BY MOUTH TWICE A DAY   Multiple Vitamin (MULTIVITAMIN WITH MINERALS) TABS tablet Take 1 tablet by mouth daily.   rosuvastatin (CRESTOR) 5 MG tablet TAKE 1 TABLET BY MOUTH EVERY DAY   RYBELSUS 14 MG TABS Take one tablet once daily by mouth once daily   spironolactone (ALDACTONE) 50 MG  tablet TAKE 1 TABLET BY MOUTH EVERY DAY     Allergies:   Patient has no known allergies.   ROS:   Please see the history of present illness.     All other systems reviewed and are negative.   Labs/Other Tests and Data Reviewed:    Recent Labs: 01/26/2021: Magnesium 2.1 04/14/2021: ALT 23; BUN 19; Creatinine, Ser 1.21; Hemoglobin 11.6; Platelets 253; Potassium 4.7; Sodium 129   Recent Lipid  Panel Lab Results  Component Value Date/Time   CHOL 184 11/24/2020 09:35 AM   TRIG 109 11/24/2020 09:35 AM   HDL 66 11/24/2020 09:35 AM   CHOLHDL 2.8 11/24/2020 09:35 AM   CHOLHDL 1.9 06/15/2020 10:19 AM   LDLCALC 99 11/24/2020 09:35 AM   LDLCALC 63 06/15/2020 10:19 AM    Wt Readings from Last 3 Encounters:  09/14/21 246 lb 1.9 oz (111.6 kg)  06/23/21 246 lb (111.6 kg)  04/14/21 248 lb 14.4 oz (112.9 kg)     ASSESSMENT & PLAN:    Suspected COVID-19 infection URTI/acute viral illness Check rapid flu and COVID RT-PCR Continue symptomatic treatment with Robitussin or Mucinex for now Advised to maintain adequate hydration Started Paxlovid renally dosed - home COVID test positive today   Time:   Today, I have spent 9 minutes reviewing the chart, including problem list, medications, and with the patient with telehealth technology discussing the above problems.   Medication Adjustments/Labs and Tests Ordered: Current medicines are reviewed at length with the patient today.  Concerns regarding medicines are outlined above.   Tests Ordered: No orders of the defined types were placed in this encounter.   Medication Changes: No orders of the defined types were placed in this encounter.    Note: This dictation was prepared with Dragon dictation along with smaller phrase technology. Similar sounding words can be transcribed inadequately or may not be corrected upon review. Any transcriptional errors that result from this process are unintentional.      Disposition:  Follow up  Signed, Lindell Spar, MD  09/27/2021 8:37 AM     [place]

## 2021-09-27 NOTE — Telephone Encounter (Signed)
Pt made a virtual visit with patel 09-27-21

## 2021-09-27 NOTE — Telephone Encounter (Signed)
Patient called spoke to Dr Allena Katz today and needs a out of work note.

## 2021-09-27 NOTE — Addendum Note (Signed)
Addended byTrena Platt on: 09/27/2021 05:36 PM   Modules accepted: Orders

## 2021-09-28 ENCOUNTER — Encounter: Payer: Self-pay | Admitting: *Deleted

## 2021-09-28 ENCOUNTER — Encounter: Payer: Self-pay | Admitting: Internal Medicine

## 2021-09-29 LAB — NOVEL CORONAVIRUS, NAA: SARS-CoV-2, NAA: DETECTED — AB

## 2021-10-01 ENCOUNTER — Encounter: Payer: Self-pay | Admitting: *Deleted

## 2021-10-01 ENCOUNTER — Telehealth: Payer: Self-pay

## 2021-10-01 NOTE — Telephone Encounter (Signed)
Work note provided sent through [employer]

## 2021-10-01 NOTE — Telephone Encounter (Signed)
Need work note to be return on Monday, 12/12 with NO Restrictions added to the last work note. She will come by to pick up letter.

## 2021-10-01 NOTE — Telephone Encounter (Signed)
FMLA Forms  (for patient and one for spouse)   Copied Sleeved Noted

## 2021-10-07 DIAGNOSIS — Z0279 Encounter for issue of other medical certificate: Secondary | ICD-10-CM

## 2021-10-08 ENCOUNTER — Telehealth: Payer: Self-pay | Admitting: Family Medicine

## 2021-10-08 NOTE — Telephone Encounter (Signed)
Pt informed FMLA finished and can pick up

## 2021-10-25 DIAGNOSIS — E1159 Type 2 diabetes mellitus with other circulatory complications: Secondary | ICD-10-CM | POA: Diagnosis not present

## 2021-10-29 ENCOUNTER — Other Ambulatory Visit: Payer: Self-pay | Admitting: Family Medicine

## 2021-11-05 LAB — LIPID PANEL
Chol/HDL Ratio: 3 ratio (ref 0.0–4.4)
Cholesterol, Total: 193 mg/dL (ref 100–199)
HDL: 65 mg/dL (ref >39)
LDL Chol Calc (NIH): 113 mg/dL — ABNORMAL HIGH (ref 0–99)
Triglycerides: 84 mg/dL (ref 0–149)
VLDL Cholesterol Cal: 15 mg/dL (ref 5–40)

## 2021-11-05 LAB — CMP14+EGFR
ALT: 12 IU/L (ref 0–32)
AST: 15 IU/L (ref 0–40)
Albumin/Globulin Ratio: 1.6 (ref 1.2–2.2)
Albumin: 4.1 g/dL (ref 3.8–4.9)
Alkaline Phosphatase: 71 IU/L (ref 44–121)
BUN/Creatinine Ratio: 18 (ref 9–23)
BUN: 19 mg/dL (ref 6–24)
Bilirubin Total: 0.2 mg/dL (ref 0.0–1.2)
CO2: 24 mmol/L (ref 20–29)
Calcium: 9.6 mg/dL (ref 8.7–10.2)
Chloride: 102 mmol/L (ref 96–106)
Creatinine, Ser: 1.08 mg/dL — ABNORMAL HIGH (ref 0.57–1.00)
Globulin, Total: 2.6 g/dL (ref 1.5–4.5)
Glucose: 141 mg/dL — ABNORMAL HIGH (ref 70–99)
Potassium: 4.8 mmol/L (ref 3.5–5.2)
Sodium: 140 mmol/L (ref 134–144)
Total Protein: 6.7 g/dL (ref 6.0–8.5)
eGFR: 62 mL/min/{1.73_m2} (ref >59)

## 2021-11-05 LAB — HEMOGLOBIN A1C
Est. average glucose Bld gHb Est-mCnc: 154 mg/dL
Hgb A1c MFr Bld: 7 % — ABNORMAL HIGH (ref 4.8–5.6)

## 2021-11-05 LAB — MICROALBUMIN / CREATININE URINE RATIO
Creatinine, Urine: 175.8 mg/dL
Microalb/Creat Ratio: <2 mg/g creat (ref 0–29)
Microalbumin, Urine: <3 ug/mL

## 2021-11-23 ENCOUNTER — Ambulatory Visit: Payer: BC Managed Care – PPO | Admitting: Family Medicine

## 2021-12-14 ENCOUNTER — Other Ambulatory Visit: Payer: Self-pay | Admitting: Family Medicine

## 2021-12-23 ENCOUNTER — Ambulatory Visit: Payer: BC Managed Care – PPO | Admitting: Internal Medicine

## 2021-12-23 ENCOUNTER — Encounter: Payer: Self-pay | Admitting: Internal Medicine

## 2021-12-23 ENCOUNTER — Other Ambulatory Visit: Payer: Self-pay

## 2021-12-23 VITALS — BP 126/88 | HR 93 | Resp 18 | Ht 65.0 in | Wt 246.0 lb

## 2021-12-23 DIAGNOSIS — E1159 Type 2 diabetes mellitus with other circulatory complications: Secondary | ICD-10-CM | POA: Diagnosis not present

## 2021-12-23 DIAGNOSIS — S90819A Abrasion, unspecified foot, initial encounter: Secondary | ICD-10-CM | POA: Insufficient documentation

## 2021-12-23 NOTE — Patient Instructions (Signed)
Please avoid wearing new hard shoes for now. ? ?Please continue to wear soft/cushioned, wide based shoes to avoid foot abrasions or ulcer. ?

## 2021-12-23 NOTE — Progress Notes (Signed)
? ?Acute Office Visit ? ?Subjective:  ? ? Patient ID: Glenda Thompson, female    DOB: [birth year], 51 y.o.   MRN: [id number] ? ?Chief Complaint  ?Patient presents with  ? Foot Injury  ?  Pt has to wear boots at work she got prescription for diabetic boots as she has diabetes she wore these one day and her feet are getting open spots on feet and they are really red   ? ? ?HPI ?Patient is in today for complaint of sudden onset abrasions over her b/l feet, over 5th toes, over sole near 5th metatarsal and her dorsal aspect of feet.  Of note, she was recently given new diabetic boots at her workplace.  She had abrasions of b/l feet on the same evening after she took her shoes off.  She has history of type II DM, but has not had diabetic foot ulcers in the past. ? ?Past Medical History:  ?Diagnosis Date  ? Arthritis   ? Diabetes mellitus   ? Diabetes mellitus without complication ([place])   ? Phreesia 01/19/2021  ? Hyperlipidemia   ? Hypertension   ? ? ?Past Surgical History:  ?Procedure Laterality Date  ? BIOPSY  01/22/2021  ? Procedure: BIOPSY;  Surgeon: Harvel Quale, MD;  [facility];  Service: Gastroenterology;;  ? CESAREAN SECTION    ? COLONOSCOPY  01/22/2021  ? COLONOSCOPY WITH PROPOFOL N/A 01/22/2021  ? Procedure: COLONOSCOPY WITH PROPOFOL;  Surgeon: Harvel Quale, MD;  [facility];  Service: Gastroenterology;  Laterality: N/A;  Am  ? ? ?Family History  ?Problem Relation Age of Onset  ? Hypertension Mother   ? Diabetes Mother   ? Hyperlipidemia Mother   ? Diabetes Brother   ? ? ?Social History  ? ?Socioeconomic History  ? Marital status: Married  ?  Spouse name: Not on file  ? Number of children: Not on file  ? Years of education: Not on file  ? Highest education level: Not on file  ?Occupational History  ? Not on file  ?Tobacco Use  ? Smoking status: Never  ? Smokeless tobacco: Never  ?Vaping Use  ? Vaping Use: Never used  ?Substance and Sexual Activity  ? Alcohol use:  Never  ? Drug use: Never  ? Sexual activity: Not on file  ?Other Topics Concern  ? Not on file  ?Social History Narrative  ? Not on file  ? ?Social Determinants of Health  ? ?Financial Resource Strain: Not on file  ?Food Insecurity: Not on file  ?Transportation Needs: Not on file  ?Physical Activity: Not on file  ?Stress: Not on file  ?Social Connections: Not on file  ?Intimate Partner Violence: Not on file  ? ? ?Outpatient Medications Prior to Visit  ?Medication Sig Dispense Refill  ? amLODipine (NORVASC) 10 MG tablet TAKE 1 TABLET(10 MG) BY MOUTH DAILY 90 tablet 1  ? blood glucose meter kit and supplies Three times daily dx E11.65 Freestyle Lite 1 each 0  ? fluticasone (FLONASE) 50 MCG/ACT nasal spray SPRAY 2 SPRAYS INTO EACH NOSTRIL EVERY DAY 48 mL 4  ? glipiZIDE (GLUCOTROL XL) 2.5 MG 24 hr tablet TAKE 1 TABLET BY MOUTH DAILY WITH BREAKFAST. 90 tablet 1  ? glucose blood (FREESTYLE LITE) test strip USE TO TEST THREE TIMES DAILY 100 each 5  ? Insulin Pen Needle (B-D ULTRAFINE III SHORT PEN) 31G X 8 MM MISC Use as directed to inject insulin daily. 100 each 2  ? Lancets (  FREESTYLE) lancets USE TO OBTAIN A BLOOD SPECIMEN TO TEST BLOOD SUGAR TWICE A DAY 200 each 5  ? lisinopril-hydrochlorothiazide (ZESTORETIC) 20-12.5 MG tablet TAKE 2 TABLETS BY MOUTH DAILY 180 tablet 1  ? metFORMIN (GLUCOPHAGE-XR) 500 MG 24 hr tablet TAKE 1 TABLET BY MOUTH TWICE A DAY 180 tablet 1  ? Multiple Vitamin (MULTIVITAMIN WITH MINERALS) TABS tablet Take 1 tablet by mouth daily.    ? rosuvastatin (CRESTOR) 5 MG tablet TAKE 1 TABLET BY MOUTH EVERY DAY 90 tablet 1  ? RYBELSUS 14 MG TABS Take one tablet once daily by mouth once daily 30 tablet 3  ? spironolactone (ALDACTONE) 50 MG tablet TAKE 1 TABLET BY MOUTH EVERY DAY 90 tablet 1  ? ?No facility-administered medications prior to visit.  ? ? ?No Known Allergies ? ?Review of Systems  ?Constitutional:  Negative for chills and fever.  ?Respiratory:  Negative for cough and shortness of breath.    ?Cardiovascular:  Negative for chest pain and palpitations.  ?Endocrine: Negative for polydipsia and polyuria.  ?Genitourinary:  Negative for dysuria and hematuria.  ?Musculoskeletal:  Negative for neck pain and neck stiffness.  ?Skin:  Positive for color change.  ?     Bruising over b/l feet  ?Neurological:  Negative for dizziness and weakness.  ?Psychiatric/Behavioral:  Negative for agitation and behavioral problems.   ? ?   ?Objective:  ?  ?Physical Exam ?Vitals reviewed.  ?Constitutional:   ?   General: She is not in acute distress. ?   Appearance: She is obese. She is not diaphoretic.  ?HENT:  ?   Head: Normocephalic and atraumatic.  ?Eyes:  ?   General: No scleral icterus. ?   Extraocular Movements: Extraocular movements intact.  ?Cardiovascular:  ?   Rate and Rhythm: Normal rate and regular rhythm.  ?   Pulses: Normal pulses.  ?   Heart sounds: Normal heart sounds. No murmur heard. ?Pulmonary:  ?   Breath sounds: Normal breath sounds. No wheezing or rales.  ?Musculoskeletal:  ?   Cervical back: Neck supple. No tenderness.  ?   Right lower leg: No edema.  ?   Left lower leg: No edema.  ?Skin: ?   General: Skin is warm.  ?   Comments: Multiple abrasions noted over b/l feet, on 5th toes b/l and over sole  ?Neurological:  ?   General: No focal deficit present.  ?   Mental Status: She is alert and oriented to person, place, and time.  ?Psychiatric:     ?   Mood and Affect: Mood normal.     ?   Behavior: Behavior normal.  ? ? ?BP 126/88 (BP Location: Left Arm, Patient Position: Sitting, Cuff Size: Normal)   Pulse 93   Resp 18   Ht _0  (1.651 m)   Wt 246 lb (111.6 kg)   LMP 11/02/2018   SpO2 98%   BMI 40.94 kg/m?  ?Wt Readings from Last 3 Encounters:  ?12/23/21 246 lb (111.6 kg)  ?09/14/21 246 lb 1.9 oz (111.6 kg)  ?06/23/21 246 lb (111.6 kg)  ? ? ? ?   ?Assessment & Plan:  ? ?Problem List Items Addressed This Visit   ? ?  ? Cardiovascular and Mediastinum  ? Type 2 diabetes mellitus with vascular disease  ([place]) - Primary  ?  Lab Results  ?Component Value Date  ? HGBA1C 7.0 (H) 10/25/2021  ? ?On metformin, glipizide and Rybelsus ?Advised to follow diabetic diet ?On statin and ACEi ?No  reported history of diabetic foot ulcer ?Her current food abrasions are not due to type II DM, as they were sudden onset, after using new boots at her workplace ?  ?  ? Relevant Orders  ? Ambulatory referral to Podiatry  ?  ? Musculoskeletal and Integument  ? Abrasion of foot or toe ?Her abrasions of b/l feet likely due to new boots at her workplace ?Referred to podiatry for proper assessment for diabetic shoes ?Advised to use soft/cushioned, wide-based shoes to prevent abrasions or ulcers of foot ?Work note provided  ? Relevant Orders  ? Ambulatory referral to Podiatry  ? ? ? ?No orders of the defined types were placed in this encounter. ? ? ? ?Lindell Spar, MD ?

## 2021-12-23 NOTE — Assessment & Plan Note (Signed)
Lab Results  ?Component Value Date  ? HGBA1C 7.0 (H) 10/25/2021  ? ? ?On metformin, glipizide and Rybelsus ?Advised to follow diabetic diet ?On statin and ACEi ?No reported history of diabetic foot ulcer ?Her current food abrasions are not due to type II DM, as they were sudden onset, after using new boots at her workplace ?

## 2022-01-03 ENCOUNTER — Encounter: Payer: Self-pay | Admitting: Podiatry

## 2022-01-03 ENCOUNTER — Ambulatory Visit (INDEPENDENT_AMBULATORY_CARE_PROVIDER_SITE_OTHER): Payer: Worker's Compensation | Admitting: Podiatry

## 2022-01-03 ENCOUNTER — Other Ambulatory Visit: Payer: Self-pay

## 2022-01-03 DIAGNOSIS — L84 Corns and callosities: Secondary | ICD-10-CM

## 2022-01-03 DIAGNOSIS — E0843 Diabetes mellitus due to underlying condition with diabetic autonomic (poly)neuropathy: Secondary | ICD-10-CM

## 2022-01-03 NOTE — Progress Notes (Signed)
? ?  Subjective: ?51 y.o. female PMHx diabetes mellitus presenting to the office today as a new patient for evaluation of symptomatic preulcerative lesions that developed to the plantar aspect of the bilateral feet.  Patient states that she works at [employer] and there is a Nutritional therapist stating that she needs to wear rubber work boots.  Patient states that since she began wearing the rubber work boots she has developed symptomatic calluses and lesions to her feet.  She is diabetic and concerned about ulcers.  She presents for further treatment and evaluation ? ?Past Medical History:  ?Diagnosis Date  ? Arthritis   ? Diabetes mellitus   ? Diabetes mellitus without complication ([place])   ? Phreesia 01/19/2021  ? Hyperlipidemia   ? Hypertension   ? ?Past Surgical History:  ?Procedure Laterality Date  ? BIOPSY  01/22/2021  ? Procedure: BIOPSY;  Surgeon: Harvel Quale, MD;  [facility];  Service: Gastroenterology;;  ? CESAREAN SECTION    ? COLONOSCOPY  01/22/2021  ? COLONOSCOPY WITH PROPOFOL N/A 01/22/2021  ? Procedure: COLONOSCOPY WITH PROPOFOL;  Surgeon: Harvel Quale, MD;  [facility];  Service: Gastroenterology;  Laterality: N/A;  Am  ? ?No Known Allergies ? ? ?Objective:  ?Physical Exam ?General: Alert and oriented x3 in no acute distress ? ?Dermatology: Hyperkeratotic preulcerative callus lesion(s) present on the plantar aspect of the fifth MTP bilateral.  Superficial skin breakdown however there is no exposed underlying subcutaneous tissue.  Skin is warm, dry and supple bilateral lower extremities. Negative for open lesions or macerations. ? ?Vascular: Palpable pedal pulses bilaterally. No edema or erythema noted. Capillary refill within normal limits. ? ?Neurological: Epicritic and protective threshold grossly intact bilaterally.  ? ?Musculoskeletal Exam: No pedal deformities ? ?Assessment: ?1.  Preulcerative callus lesions bilateral fifth MTP ?2.  Diabetes mellitus  without complication ? ? ?Plan of Care:  ?1. Patient evaluated ?2. Excisional debridement of keratoic lesion(s) using a chisel blade was performed without incident.  ?3.  A note for medical necessity was provided for the patient to take to work advising against rubber work boots.  Recommend supportive shoes or sneakers that are slip resistant. ?4.  Return to clinic as needed ? ?Edrick Kins, DPM ?[place] ? ?Dr. Edrick Kins, DPM  ?  ?[place]                                        ?[place], [place] [address]                ?Office [phone]  ?Fax [phone] ? ? ? ? ?

## 2022-01-04 ENCOUNTER — Ambulatory Visit: Payer: BC Managed Care – PPO | Admitting: Family Medicine

## 2022-01-04 ENCOUNTER — Encounter: Payer: Self-pay | Admitting: Family Medicine

## 2022-01-04 VITALS — BP 141/62 | HR 85 | Ht 64.0 in | Wt 243.0 lb

## 2022-01-04 DIAGNOSIS — Z23 Encounter for immunization: Secondary | ICD-10-CM

## 2022-01-04 DIAGNOSIS — E782 Mixed hyperlipidemia: Secondary | ICD-10-CM

## 2022-01-04 DIAGNOSIS — E1159 Type 2 diabetes mellitus with other circulatory complications: Secondary | ICD-10-CM

## 2022-01-04 DIAGNOSIS — I10 Essential (primary) hypertension: Secondary | ICD-10-CM | POA: Diagnosis not present

## 2022-01-04 DIAGNOSIS — Z1231 Encounter for screening mammogram for malignant neoplasm of breast: Secondary | ICD-10-CM | POA: Diagnosis not present

## 2022-01-04 MED ORDER — TIRZEPATIDE 5 MG/0.5ML ~~LOC~~ SOAJ: 5.0000 mg | SUBCUTANEOUS | 1 refills | Status: DC

## 2022-01-04 MED ORDER — TIRZEPATIDE 2.5 MG/0.5ML ~~LOC~~ SOAJ: 2.5000 mg | SUBCUTANEOUS | 0 refills | Status: DC

## 2022-01-04 NOTE — Patient Instructions (Addendum)
Annual with pap May 20  or after, also TdAP and foot exam at visit, call if you need me before ? ?Shingrix #2 today ? ?Please sched August mammogram atcheckout ? ?New for diabetes is once weekly Mounjaro, no glipizide or rybelsus once you start,  can continue metformin, dose will increase from 2.5 mg weekly to 5 mg weekly ? ?Please work on  weight  loss by food change and exercise ? ?Labs today HBA1C, chem 7 and EGFr and magnesium level ? ?Daily miralax and 2 stool softeners will help regulate bowel movements ? ?Thanks for choosing [facility], we consider it a privelige to serve you. ? ? ? ?

## 2022-01-05 LAB — BMP8+EGFR
BUN/Creatinine Ratio: 17 (ref 9–23)
BUN: 18 mg/dL (ref 6–24)
CO2: 26 mmol/L (ref 20–29)
Calcium: 9.6 mg/dL (ref 8.7–10.2)
Chloride: 102 mmol/L (ref 96–106)
Creatinine, Ser: 1.04 mg/dL — ABNORMAL HIGH (ref 0.57–1.00)
Glucose: 116 mg/dL — ABNORMAL HIGH (ref 70–99)
Potassium: 4.6 mmol/L (ref 3.5–5.2)
Sodium: 140 mmol/L (ref 134–144)
eGFR: 65 mL/min/{1.73_m2} (ref >59)

## 2022-01-05 LAB — MAGNESIUM: Magnesium: 1.8 mg/dL (ref 1.6–2.3)

## 2022-01-05 LAB — HEMOGLOBIN A1C
Est. average glucose Bld gHb Est-mCnc: 160 mg/dL
Hgb A1c MFr Bld: 7.2 % — ABNORMAL HIGH (ref 4.8–5.6)

## 2022-01-17 ENCOUNTER — Telehealth: Payer: Self-pay

## 2022-01-17 ENCOUNTER — Encounter: Payer: Self-pay | Admitting: Family Medicine

## 2022-01-17 NOTE — Progress Notes (Signed)
? ?Glenda Thompson     MRN: [id number]      DOB: 05-13-1971 ? ? ?HPI ?Glenda Thompson is here for follow up and re-evaluation of chronic medical conditions, medication management and review of any available recent lab and radiology data.  ?Preventive health is updated, specifically  Cancer screening and Immunization.   ?Questions or concerns regarding consultations or procedures which the PT has had in the interim are  addressed. ?The PT c/o constipation and scant hard stool.  ?Denies polyuria, polydipsia, blurred vision , or hypoglycemic episodes. ?  ? ?ROS ?Denies recent fever or chills. ?Denies sinus pressure, nasal congestion, ear pain or sore throat. ?Denies chest congestion, productive cough or wheezing. ?Denies chest pains, palpitations and leg swelling ?Denies abdominal pain, nausea, vomiting,diarrhea or constipation.   ?Denies dysuria, frequency, hesitancy or incontinence. ?Foot pain being addressed by Podiatry, footgear on her job a problem ?Denies headaches, seizures, numbness, or tingling. ?Denies depression, anxiety or insomnia. ?Denies skin break down or rash. ? ? ?PE ? ?BP (!) 141/62   Pulse 85   Ht 5\' 4"  (1.626 m)   Wt 243 lb 0.6 oz (110.2 kg)   LMP 11/02/2018   SpO2 97%   BMI 41.72 kg/m?  ? ?Patient alert and oriented and in no cardiopulmonary distress. ? ?HEENT: No facial asymmetry, EOMI,     Neck supple . ? ?Chest: Clear to auscultation bilaterally. ? ?CVS: S1, S2 no murmurs, no S3.Regular rate. ? ?ABD: Soft non tender.  ? ?Ext: No edema ? ?MS: Adequate ROM spine, shoulders, hips and knees. ? ?Skin: Intact, no ulcerations or rash noted. ? ?Psych: Good eye contact, normal affect. Memory intact not anxious or depressed appearing. ? ?CNS: CN 2-12 intact, power,  normal throughout.no focal deficits noted. ? ? ?Assessment & Plan ? ?Essential hypertension, benign ?DASH diet and commitment to daily physical activity for a minimum of 30 minutes discussed and encouraged, as a part of hypertension  management. ?The importance of attaining a healthy weight is also discussed. ?Elevated and not at gol, no med change, lifestyle change and re eval ? ? ?  01/04/2022  ?  1:45 PM 12/23/2021  ?  3:18 PM 09/20/2021  ?  2:47 AM 09/14/2021  ?  4:15 PM 06/23/2021  ?  2:44 PM 04/15/2021  ? 12:09 AM 04/14/2021  ? 10:15 PM  ?BP/Weight  ?Systolic BP Q000111Q [id number] [id number] 134 127 139  ?Diastolic BP 62 88 70 83 68 86 71  ?Wt. (Lbs) 243.04 246  246.12 246  248.9  ?BMI 41.72 kg/m2 40.94 kg/m2  40.96 kg/m2 40.94 kg/m2  41.42 kg/m2  ? ? ? ? ? ?Mixed hyperlipidemia ?Hyperlipidemia:Low fat diet discussed and encouraged. ? ? ?Lipid Panel  ?Lab Results  ?Component Value Date  ? CHOL 193 10/25/2021  ? HDL 65 10/25/2021  ? LDLCALC 113 (H) 10/25/2021  ? TRIG 84 10/25/2021  ? CHOLHDL 3.0 10/25/2021  ? ? ? ?Not at goal, reduce fat intake and take med prescribed ?Updated lab needed at/ before next visit. ? ? ?Morbid obesity ? ?Patient re-educated about  the importance of commitment to a  minimum of 150 minutes of exercise per week as able. ? ?The importance of healthy food choices with portion control discussed, as well as eating regularly and within a 12 hour window most days. ?The need to choose "clean , green" food 50 to 75% of the time is discussed, as well as to make water the primary drink and set a goal  of 64 ounces water daily. ? ?  ? ?  01/04/2022  ?  1:45 PM 12/23/2021  ?  3:18 PM 09/14/2021  ?  4:15 PM  ?Weight /BMI  ?Weight 243 lb 0.6 oz 246 lb 246 lb 1.9 oz  ?Height 5\' 4"  (1.626 m) 5\' 5"  (1.651 m) 5\' 5"  (1.651 m)  ?BMI 41.72 kg/m2 40.94 kg/m2 40.96 kg/m2  ? ?Start mounjaro for diabetes, may assist with weight loss, stop glipizide and rybelsus ? ? ?Type 2 diabetes mellitus with vascular disease ([place]) ?Glenda Thompson is reminded of the importance of commitment to daily physical activity for 30 minutes or more, as able and the need to limit carbohydrate intake to 30 to 60 grams per meal to help with blood sugar control.  ? ?The need to take  medication as prescribed, test blood sugar as directed, and to call between visits if there is a concern that blood sugar is uncontrolled is also discussed.  ? ?Glenda Thompson is reminded of the importance of daily foot exam, annual eye examination, and good blood sugar, blood pressure and cholesterol control. ?Uncontrolled , change to mounjaro with taper off of glipizifde and stop rybelsus ? ? ?  Latest Ref Rng & Units 01/04/2022  ?  2:45 PM 10/25/2021  ?  9:19 AM 10/25/2021  ?  9:11 AM 06/23/2021  ?  3:01 PM 04/14/2021  ? 10:49 PM  ?Diabetic Labs  ?HbA1c 4.8 - 5.6 % 7.2    7.0   7.2     ?Micro/Creat Ratio 0 - 29 mg/g creat  <2       ?Chol 100 - 199 mg/dL   193      ?HDL >39 mg/dL   65      ?Calc LDL 0 - 99 mg/dL   113      ?Triglycerides 0 - 149 mg/dL   84      ?Creatinine 0.57 - 1.00 mg/dL 1.04    1.08    1.21    ? ? ?  01/04/2022  ?  1:45 PM 12/23/2021  ?  3:18 PM 09/20/2021  ?  2:47 AM 09/14/2021  ?  4:15 PM 06/23/2021  ?  2:44 PM 04/15/2021  ? 12:09 AM 04/14/2021  ? 10:15 PM  ?BP/Weight  ?Systolic BP Q000111Q [id number] 134 127 139  ?Diastolic BP 62 88 70 83 68 86 71  ?Wt. (Lbs) 243.04 246  246.12 246  248.9  ?BMI 41.72 kg/m2 40.94 kg/m2  40.96 kg/m2 40.94 kg/m2  41.42 kg/m2  ? ? ?  06/23/2021  ?  2:40 PM 06/22/2020  ?  8:40 AM  ?Foot/eye exam completion dates  ?Foot Form Completion Done Done  ? ? ? ? ? ? ?

## 2022-01-17 NOTE — Assessment & Plan Note (Signed)
DASH diet and commitment to daily physical activity for a minimum of 30 minutes discussed and encouraged, as a part of hypertension management. ?The importance of attaining a healthy weight is also discussed. ?Elevated and not at gol, no med change, lifestyle change and re eval ? ? ?  01/04/2022  ?  1:45 PM 12/23/2021  ?  3:18 PM 09/20/2021  ?  2:47 AM 09/14/2021  ?  4:15 PM 06/23/2021  ?  2:44 PM 04/15/2021  ? 12:09 AM 04/14/2021  ? 10:15 PM  ?BP/Weight  ?Systolic BP Q000111Q [id number] 134 127 139  ?Diastolic BP 62 88 70 83 68 86 71  ?Wt. (Lbs) 243.04 246  246.12 246  248.9  ?BMI 41.72 kg/m2 40.94 kg/m2  40.96 kg/m2 40.94 kg/m2  41.42 kg/m2  ? ? ? ? ?

## 2022-01-17 NOTE — Assessment & Plan Note (Signed)
?  Patient re-educated about  the importance of commitment to a  minimum of 150 minutes of exercise per week as able. ? ?The importance of healthy food choices with portion control discussed, as well as eating regularly and within a 12 hour window most days. ?The need to choose "clean , green" food 50 to 75% of the time is discussed, as well as to make water the primary drink and set a goal of 64 ounces water daily. ? ?  ? ?  01/04/2022  ?  1:45 PM 12/23/2021  ?  3:18 PM 09/14/2021  ?  4:15 PM  ?Weight /BMI  ?Weight 243 lb 0.6 oz 246 lb 246 lb 1.9 oz  ?Height 5\' 4"  (1.626 m) 5\' 5"  (1.651 m) 5\' 5"  (1.651 m)  ?BMI 41.72 kg/m2 40.94 kg/m2 40.96 kg/m2  ? ?Start mounjaro for diabetes, may assist with weight loss, stop glipizide and rybelsus ? ?

## 2022-01-17 NOTE — Assessment & Plan Note (Signed)
[name] is reminded of the importance of commitment to daily physical activity for 30 minutes or more, as able and the need to limit carbohydrate intake to 30 to 60 grams per meal to help with blood sugar control.  ? ?The need to take medication as prescribed, test blood sugar as directed, and to call between visits if there is a concern that blood sugar is uncontrolled is also discussed.  ? ?Glenda Thompson is reminded of the importance of daily foot exam, annual eye examination, and good blood sugar, blood pressure and cholesterol control. ?Uncontrolled , change to mounjaro with taper off of glipizifde and stop rybelsus ? ? ?  Latest Ref Rng & Units 01/04/2022  ?  2:45 PM 10/25/2021  ?  9:19 AM 10/25/2021  ?  9:11 AM 06/23/2021  ?  3:01 PM 04/14/2021  ? 10:49 PM  ?Diabetic Labs  ?HbA1c 4.8 - 5.6 % 7.2    7.0   7.2     ?Micro/Creat Ratio 0 - 29 mg/g creat  <2       ?Chol 100 - 199 mg/dL   193      ?HDL >39 mg/dL   65      ?Calc LDL 0 - 99 mg/dL   113      ?Triglycerides 0 - 149 mg/dL   84      ?Creatinine 0.57 - 1.00 mg/dL 1.04    1.08    1.21    ? ? ?  01/04/2022  ?  1:45 PM 12/23/2021  ?  3:18 PM 09/20/2021  ?  2:47 AM 09/14/2021  ?  4:15 PM 06/23/2021  ?  2:44 PM 04/15/2021  ? 12:09 AM 04/14/2021  ? 10:15 PM  ?BP/Weight  ?Systolic BP [id number] 134 127 139  ?Diastolic BP 62 88 70 83 68 86 71  ?Wt. (Lbs) 243.04 246  246.12 246  248.9  ?BMI 41.72 kg/m2 40.94 kg/m2  40.96 kg/m2 40.94 kg/m2  41.42 kg/m2  ? ? ?  06/23/2021  ?  2:40 PM 06/22/2020  ?  8:40 AM  ?Foot/eye exam completion dates  ?Foot Form Completion Done Done  ? ? ? ? ? ?

## 2022-01-17 NOTE — Assessment & Plan Note (Signed)
Hyperlipidemia:Low fat diet discussed and encouraged. ? ? ?Lipid Panel  ?Lab Results  ?Component Value Date  ? CHOL 193 10/25/2021  ? HDL 65 10/25/2021  ? LDLCALC 113 (H) 10/25/2021  ? TRIG 84 10/25/2021  ? CHOLHDL 3.0 10/25/2021  ? ? ? ?Not at goal, reduce fat intake and take med prescribed ?Updated lab needed at/ before next visit. ? ?

## 2022-01-17 NOTE — Telephone Encounter (Signed)
Patient called said pharmacy has not received this medication yet.   ?MOUNJARO ?Pharmacy: [facility] ?

## 2022-01-18 NOTE — Telephone Encounter (Signed)
[facility] receipt [facility] mounjaro 2.5mg  on 3/14 and the 5mg  to start on 4/11. Is she meaning that its not covered or are they telling her they don't have anything on file for her? (They may have put it back on the shelf since its been so long since it was called in- they just need to check her record) ?

## 2022-02-11 ENCOUNTER — Telehealth: Payer: Self-pay | Admitting: Family Medicine

## 2022-02-11 NOTE — Telephone Encounter (Signed)
Is there any cream that will help with this? ?

## 2022-02-11 NOTE — Telephone Encounter (Signed)
Patient called in regard to calluses on feet. ? ?Wants to know if provider would send a cream in to help. ? ? ? ?

## 2022-02-11 NOTE — Telephone Encounter (Signed)
Error

## 2022-02-13 ENCOUNTER — Other Ambulatory Visit: Payer: Self-pay | Admitting: Family Medicine

## 2022-02-14 ENCOUNTER — Telehealth: Payer: Self-pay

## 2022-02-14 ENCOUNTER — Other Ambulatory Visit: Payer: Self-pay | Admitting: Family Medicine

## 2022-02-14 IMAGING — CT CT ABD-PELV W/ CM
2 of 5 series · 17 of 46 positions shown, 19 images · IV contrast (Omnipaque or Isovue)
Comparison: None.

CLINICAL DATA: Nausea vomiting diarrhea and abdominal pain.

EXAM:
CT ABDOMEN AND PELVIS WITH CONTRAST
TECHNIQUE: Multidetector CT imaging of the abdomen and pelvis was performed
using the standard protocol following bolus administration of
intravenous contrast.
CONTRAST:  100mL OMNIPAQUE IOHEXOL 300 MG/ML  SOLN

[Series 2: axial st · axial · 0.77mm/px · z∈[+1579,+2009]mm · 14 of 98 slices shown, 16 images]
[im 6/98  soft-tissue]
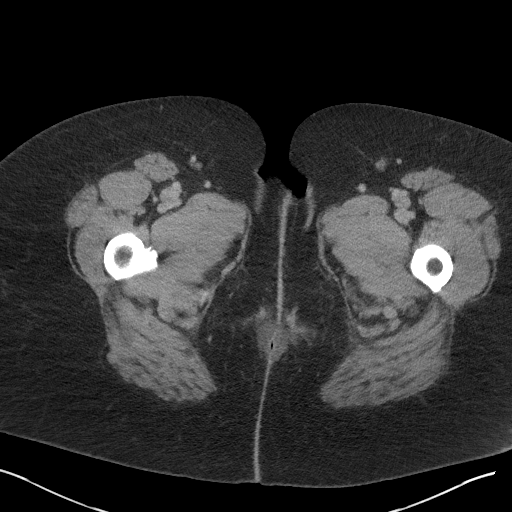
[im 6/98  bone]
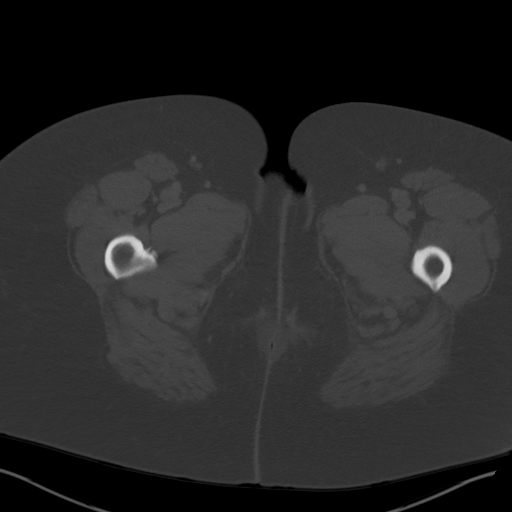
[im 11/98  soft-tissue]
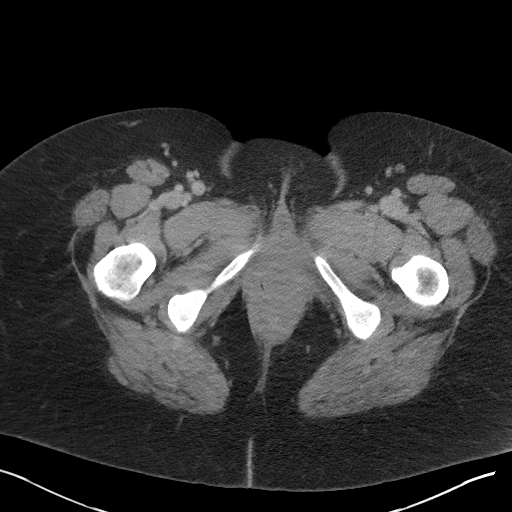
[im 21/98  soft-tissue]
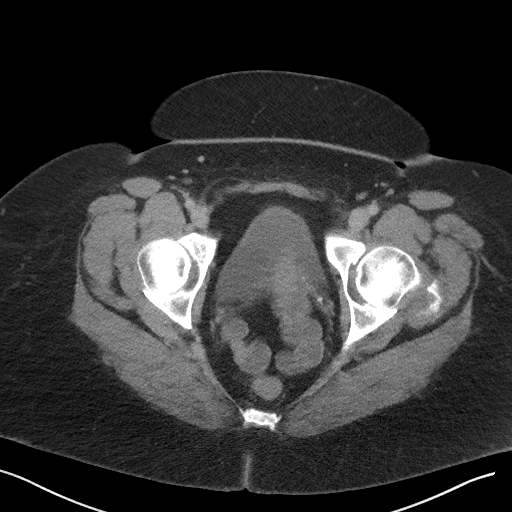
[im 26/98  soft-tissue]
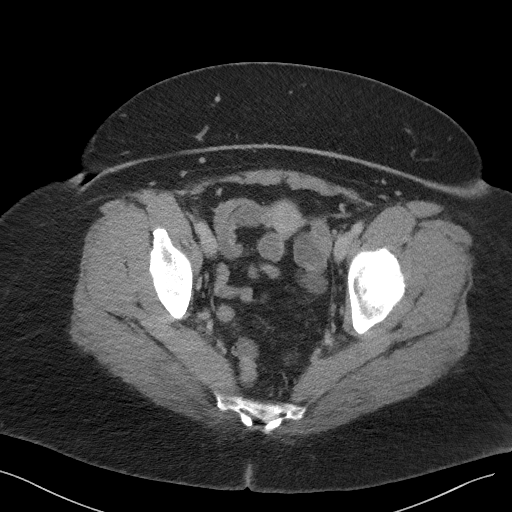
[im 31/98  soft-tissue]
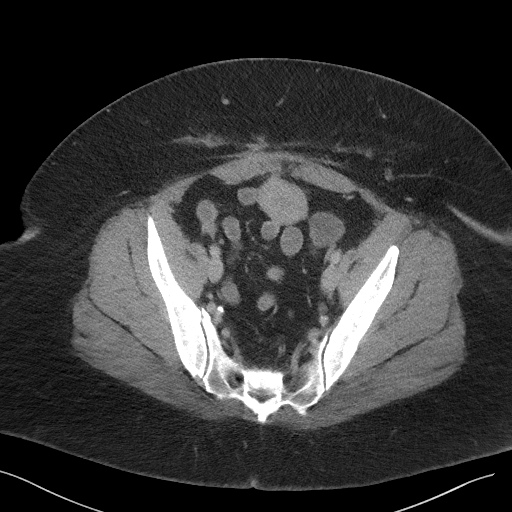
[im 41/98  soft-tissue]
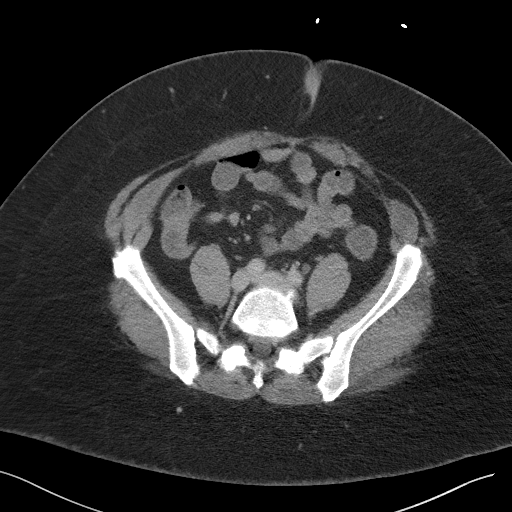
[im 46/98  soft-tissue]
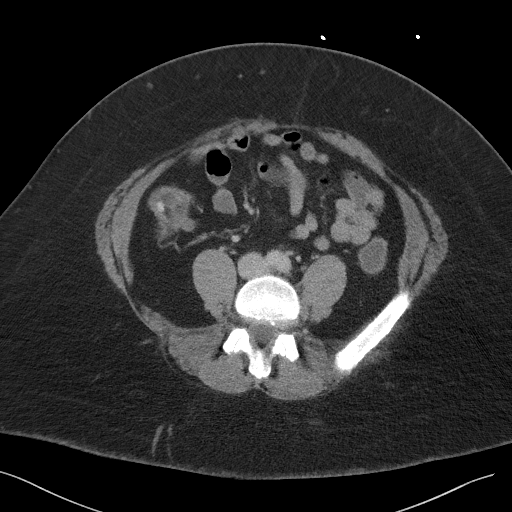
[im 52/98  soft-tissue]
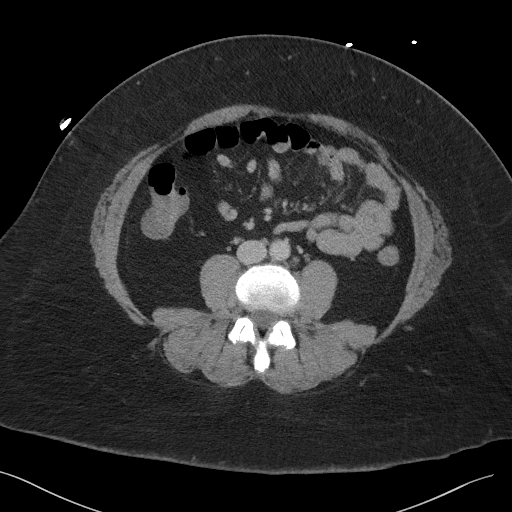
[im 57/98  soft-tissue]
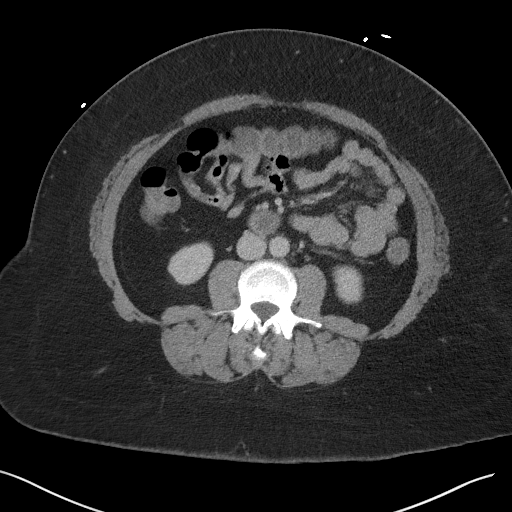
[im 57/98  bone]
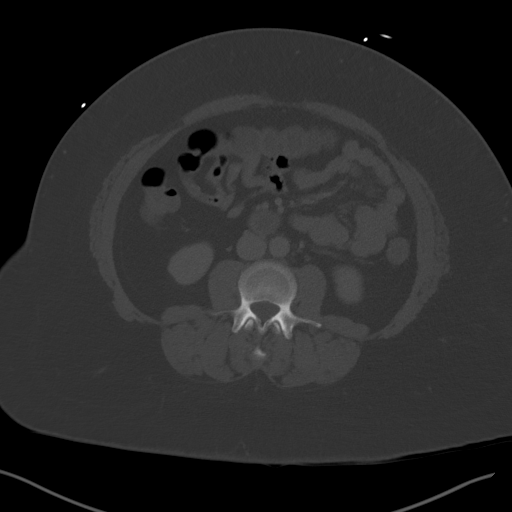
[im 67/98  soft-tissue]
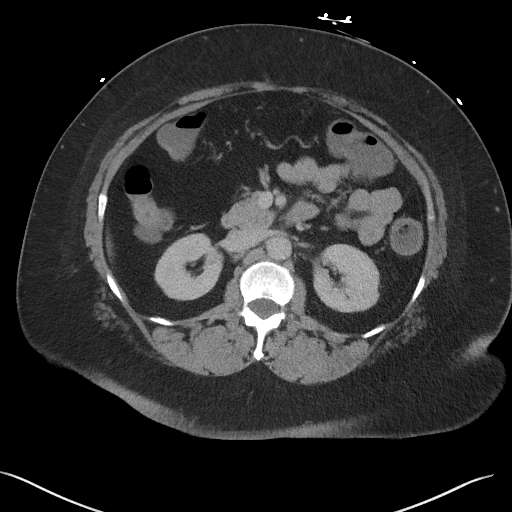
[im 72/98  soft-tissue]
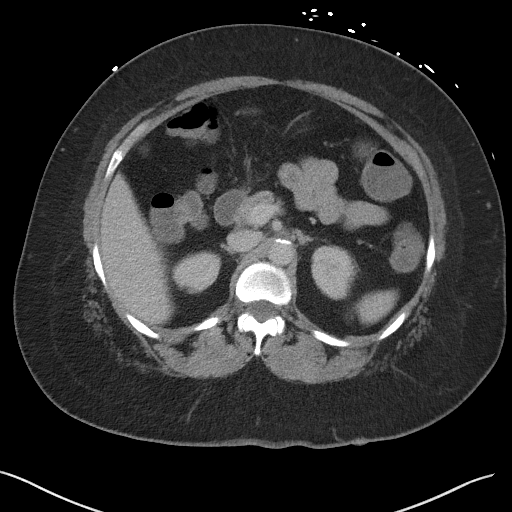
[im 77/98  soft-tissue]
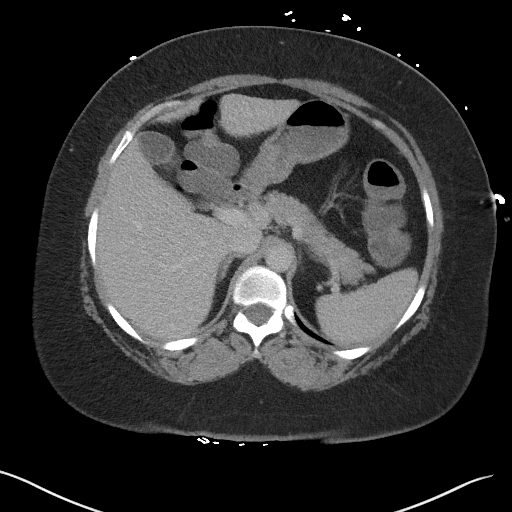
[im 87/98  soft-tissue]
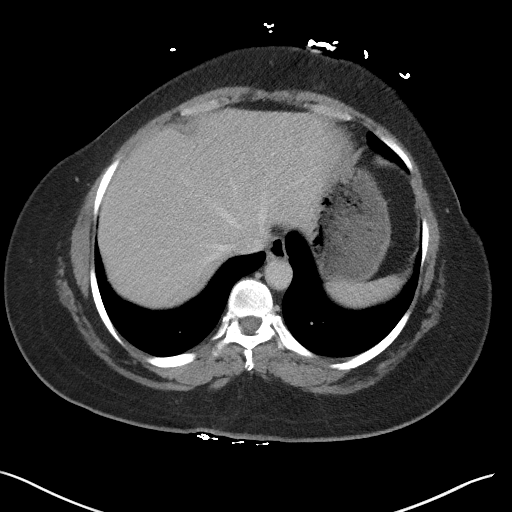
[im 92/98  soft-tissue]
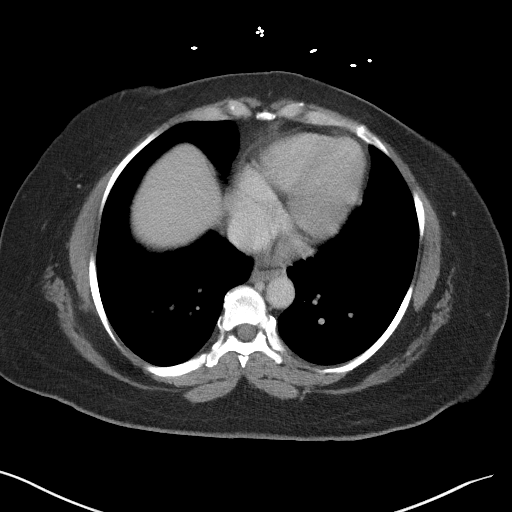

[Series 5: coronal st · coronal · 0.98mm/px · 3 of 125 slices shown]
[im 42/125  soft-tissue]
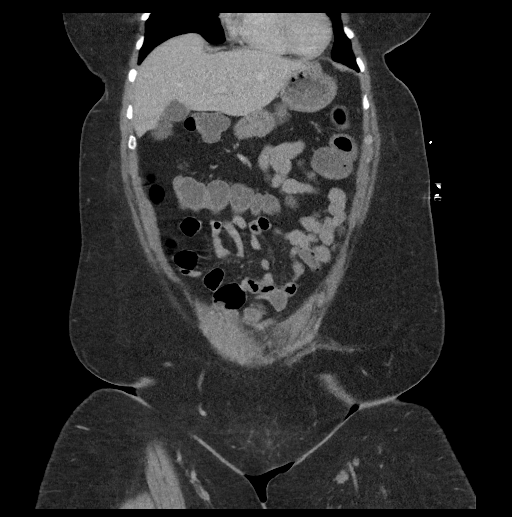
[im 56/125  soft-tissue]
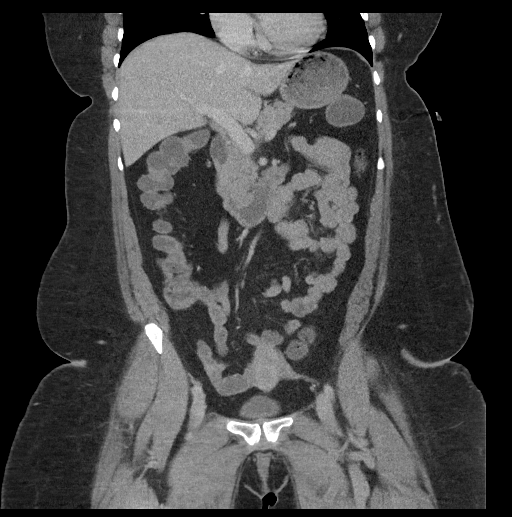
[im 69/125  soft-tissue]
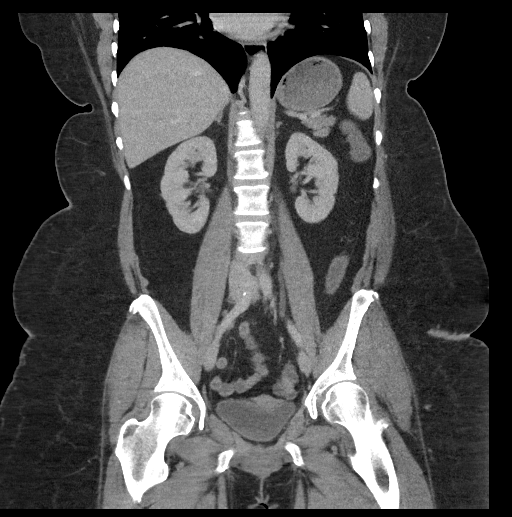

[17 of 46 positions shown; findings below may reference images not displayed]

FINDINGS: Lower chest: No acute abnormality.

Hepatobiliary: No suspicious hepatic lesions. Gallbladder is
unremarkable. No biliary ductal dilation.

Pancreas: Unremarkable

Spleen: Unremarkable

Adrenals/Urinary Tract: Adrenal glands are unremarkable. Kidneys are
normal, without renal calculi, focal lesion, or hydronephrosis.
Bladder is unremarkable.

Stomach/Bowel: Stomach is distended with enteric contents. No
suspicious small bowel dilation. Terminal ileum and appendix are
unremarkable. Fluid visualized throughout the colon.

Vascular/Lymphatic: Aortic atherosclerosis. No enlarged abdominal or
pelvic lymph nodes.

Reproductive: Uterus and bilateral adnexa are unremarkable.

Other: No abdominal wall hernia or abnormality. No abdominopelvic
ascites.

Musculoskeletal: No acute or significant osseous findings.
IMPRESSION: 1. Fluid visualized throughout the colon, which may reflect
diarrheal illness.
2. Aortic atherosclerosis.

Aortic Atherosclerosis ([id number]).

## 2022-02-14 NOTE — Telephone Encounter (Signed)
FMLA forms   Copied Noted sleeved 

## 2022-02-15 ENCOUNTER — Ambulatory Visit: Payer: BC Managed Care – PPO | Admitting: Family Medicine

## 2022-02-15 ENCOUNTER — Encounter: Payer: Self-pay | Admitting: Family Medicine

## 2022-02-15 VITALS — BP 142/82 | HR 83 | Resp 16 | Ht 65.0 in | Wt 248.8 lb

## 2022-02-15 DIAGNOSIS — I10 Essential (primary) hypertension: Secondary | ICD-10-CM

## 2022-02-15 DIAGNOSIS — E782 Mixed hyperlipidemia: Secondary | ICD-10-CM | POA: Diagnosis not present

## 2022-02-15 DIAGNOSIS — E1159 Type 2 diabetes mellitus with other circulatory complications: Secondary | ICD-10-CM

## 2022-02-15 MED ORDER — SPIRONOLACTONE 100 MG PO TABS: 100.0000 mg | ORAL_TABLET | Freq: Every day | ORAL | 1 refills | Status: DC

## 2022-02-15 NOTE — Telephone Encounter (Signed)
Has appt today

## 2022-02-15 NOTE — Assessment & Plan Note (Signed)
?  Patient re-educated about  the importance of commitment to a  minimum of 150 minutes of exercise per week as able. ? ?The importance of healthy food choices with portion control discussed, as well as eating regularly and within a 12 hour window most days. ?The need to choose "clean , green" food 50 to 75% of the time is discussed, as well as to make water the primary drink and set a goal of 64 ounces water daily. ? ?  ? ?  02/15/2022  ?  1:29 PM 01/04/2022  ?  1:45 PM 12/23/2021  ?  3:18 PM  ?Weight /BMI  ?Weight 248 lb 12.8 oz 243 lb 0.6 oz 246 lb  ?Height 5\' 5"  (1.651 m) 5\' 4"  (1.626 m) 5\' 5"  (1.651 m)  ?BMI 41.4 kg/m2 41.72 kg/m2 40.94 kg/m2  ? ? ? ?

## 2022-02-15 NOTE — Assessment & Plan Note (Signed)
Hyperlipidemia:Low fat diet discussed and encouraged. ? ? ?Lipid Panel  ?Lab Results  ?Component Value Date  ? CHOL 193 10/25/2021  ? HDL 65 10/25/2021  ? LDLCALC 113 (H) 10/25/2021  ? TRIG 84 10/25/2021  ? CHOLHDL 3.0 10/25/2021  ? ? ? ?Not at goal,  ?Updated lab needed at/ before next visit. ? ?

## 2022-02-15 NOTE — Assessment & Plan Note (Signed)
Uncontrolled, increase spiroolactone to 100 mg fdaily ?DASH diet and commitment to daily physical activity for a minimum of 30 minutes discussed and encouraged, as a part of hypertension management. ?The importance of attaining a healthy weight is also discussed. ? ? ?  02/15/2022  ?  1:29 PM 01/04/2022  ?  1:45 PM 12/23/2021  ?  3:18 PM 09/20/2021  ?  2:47 AM 09/14/2021  ?  4:15 PM 06/23/2021  ?  2:44 PM 04/15/2021  ? 12:09 AM  ?BP/Weight  ?Systolic BP 142 141 126 130 165 134 127  ?Diastolic BP 82 62 88 70 83 68 86  ?Wt. (Lbs) 248.8 243.04 246  246.12 246   ?BMI 41.4 kg/m2 41.72 kg/m2 40.94 kg/m2  40.96 kg/m2 40.94 kg/m2   ? ? ? ? ?

## 2022-02-15 NOTE — Assessment & Plan Note (Signed)
Glenda Thompson is reminded of the importance of commitment to daily physical activity for 30 minutes or more, as able and the need to limit carbohydrate intake to 30 to 60 grams per meal to help with blood sugar control.  ? ?The need to take medication as prescribed, test blood sugar as directed, and to call between visits if there is a concern that blood sugar is uncontrolled is also discussed.  ? ?Glenda Thompson is reminded of the importance of daily foot exam, annual eye examination, and good blood sugar, blood pressure and cholesterol control. ?Updated lab needed at/ before next visit. ?Not at goal ? ? ?  Latest Ref Rng & Units 01/04/2022  ?  2:45 PM 10/25/2021  ?  9:19 AM 10/25/2021  ?  9:11 AM 06/23/2021  ?  3:01 PM 04/14/2021  ? 10:49 PM  ?Diabetic Labs  ?HbA1c 4.8 - 5.6 % 7.2    7.0   7.2     ?Micro/Creat Ratio 0 - 29 mg/g creat  <2       ?Chol 100 - 199 mg/dL   782      ?HDL >39 mg/dL   65      ?Calc LDL 0 - 99 mg/dL   423      ?Triglycerides 0 - 149 mg/dL   84      ?Creatinine 0.57 - 1.00 mg/dL 5.36    1.44    3.15    ? ? ?  02/15/2022  ?  1:29 PM 01/04/2022  ?  1:45 PM 12/23/2021  ?  3:18 PM 09/20/2021  ?  2:47 AM 09/14/2021  ?  4:15 PM 06/23/2021  ?  2:44 PM 04/15/2021  ? 12:09 AM  ?BP/Weight  ?Systolic BP 142 141 126 130 165 134 127  ?Diastolic BP 82 62 88 70 83 68 86  ?Wt. (Lbs) 248.8 243.04 246  246.12 246   ?BMI 41.4 kg/m2 41.72 kg/m2 40.94 kg/m2  40.96 kg/m2 40.94 kg/m2   ? ? ?  02/15/2022  ?  1:20 PM 06/23/2021  ?  2:40 PM  ?Foot/eye exam completion dates  ?Foot Form Completion Done Done  ? ? ? ? ? ?

## 2022-02-15 NOTE — Progress Notes (Signed)
? ?Glenda Thompson     MRN: [id number]      DOB: 17-Nov-1970 ? ? ?HPI ?[name] is here for follow up and re-evaluation of chronic medical conditions, medication management and review of any available recent lab and radiology data.  ?Preventive health is updated, specifically  Cancer screening and Immunization.   ?Questions or concerns regarding consultations or procedures which the PT has had in the interim are  addressed. ?The PT denies any adverse reactions to current medications since the last visit.  ?Has callous on sole of right foot, treated by podiary , still gets sore at end of workday ?Forms to be completd for FMLA due to chronic conditions ?Denies polyuria, polydipsia, blurred vision , or hypoglycemic episodes. ? ?ROS ?Denies recent fever or chills. ?Denies sinus pressure, nasal congestion, ear pain or sore throat. ?Denies chest congestion, productive cough or wheezing. ?Denies chest pains, palpitations and leg swelling ?Denies abdominal pain, nausea, vomiting,diarrhea or constipation.   ?Denies dysuria, frequency, hesitancy or incontinence. ?Denies joint pain, swelling and limitation in mobility. ?Denies headaches, seizures, numbness, or tingling. ?Denies depression, anxiety or insomnia. ? ?PE ? ?BP (!) 142/82   Pulse 83   Resp 16   Ht 5\' 5"  (1.651 m)   Wt 248 lb 12.8 oz (112.9 kg)   LMP 11/02/2018   SpO2 98%   BMI 41.40 kg/m?  ? ?Patient alert and oriented and in no cardiopulmonary distress. ? ?HEENT: No facial asymmetry, EOMI,     Neck supple . ? ?Chest: Clear to auscultation bilaterally. ? ?CVS: S1, S2 no murmurs, no S3.Regular rate. ? ?ABD: Soft non tender.  ? ?Ext: No edema ? ?MS: Adequate ROM spine, shoulders, hips and knees. ? ?Skin: Intact, no ulcerations or rash noted. ? ?Psych: Good eye contact, normal affect. Memory intact not anxious or depressed appearing. ? ?CNS: CN 2-12 intact, power,  normal throughout.no focal deficits noted. ? ? ?Assessment & Plan ? ?Essential hypertension,  benign ?Uncontrolled, increase spiroolactone to 100 mg fdaily ?DASH diet and commitment to daily physical activity for a minimum of 30 minutes discussed and encouraged, as a part of hypertension management. ?The importance of attaining a healthy weight is also discussed. ? ? ?  02/15/2022  ?  1:29 PM 01/04/2022  ?  1:45 PM 12/23/2021  ?  3:18 PM 09/20/2021  ?  2:47 AM 09/14/2021  ?  4:15 PM 06/23/2021  ?  2:44 PM 04/15/2021  ? 12:09 AM  ?BP/Weight  ?Systolic BP 142 141 126 130 165 134 127  ?Diastolic BP 82 62 88 70 83 68 86  ?Wt. (Lbs) 248.8 243.04 246  246.12 246   ?BMI 41.4 kg/m2 41.72 kg/m2 40.94 kg/m2  40.96 kg/m2 40.94 kg/m2   ? ? ? ? ? ?Mixed hyperlipidemia ?Hyperlipidemia:Low fat diet discussed and encouraged. ? ? ?Lipid Panel  ?Lab Results  ?Component Value Date  ? CHOL 193 10/25/2021  ? HDL 65 10/25/2021  ? LDLCALC 113 (H) 10/25/2021  ? TRIG 84 10/25/2021  ? CHOLHDL 3.0 10/25/2021  ? ? ? ?Not at goal,  ?Updated lab needed at/ before next visit. ? ? ?Type 2 diabetes mellitus with vascular disease (HCC) ?[name] is reminded of the importance of commitment to daily physical activity for 30 minutes or more, as able and the need to limit carbohydrate intake to 30 to 60 grams per meal to help with blood sugar control.  ? ?The need to take medication as prescribed, test blood sugar as directed, and to  call between visits if there is a concern that blood sugar is uncontrolled is also discussed.  ? ?[name] is reminded of the importance of daily foot exam, annual eye examination, and good blood sugar, blood pressure and cholesterol control. ?Updated lab needed at/ before next visit. ?Not at goal ? ? ?  Latest Ref Rng & Units 01/04/2022  ?  2:45 PM 10/25/2021  ?  9:19 AM 10/25/2021  ?  9:11 AM 06/23/2021  ?  3:01 PM 04/14/2021  ? 10:49 PM  ?Diabetic Labs  ?HbA1c 4.8 - 5.6 % 7.2    7.0   7.2     ?Micro/Creat Ratio 0 - 29 mg/g creat  <2       ?Chol 100 - 199 mg/dL   026      ?HDL >39 mg/dL   65      ?Calc LDL 0 - 99 mg/dL    378      ?Triglycerides 0 - 149 mg/dL   84      ?Creatinine 0.57 - 1.00 mg/dL 5.88    5.02    7.74    ? ? ?  02/15/2022  ?  1:29 PM 01/04/2022  ?  1:45 PM 12/23/2021  ?  3:18 PM 09/20/2021  ?  2:47 AM 09/14/2021  ?  4:15 PM 06/23/2021  ?  2:44 PM 04/15/2021  ? 12:09 AM  ?BP/Weight  ?Systolic BP 142 141 126 130 165 134 127  ?Diastolic BP 82 62 88 70 83 68 86  ?Wt. (Lbs) 248.8 243.04 246  246.12 246   ?BMI 41.4 kg/m2 41.72 kg/m2 40.94 kg/m2  40.96 kg/m2 40.94 kg/m2   ? ? ?  02/15/2022  ?  1:20 PM 06/23/2021  ?  2:40 PM  ?Foot/eye exam completion dates  ?Foot Form Completion Done Done  ? ? ? ? ? ? ?Morbid obesity ? ?Patient re-educated about  the importance of commitment to a  minimum of 150 minutes of exercise per week as able. ? ?The importance of healthy food choices with portion control discussed, as well as eating regularly and within a 12 hour window most days. ?The need to choose "clean , green" food 50 to 75% of the time is discussed, as well as to make water the primary drink and set a goal of 64 ounces water daily. ? ?  ? ?  02/15/2022  ?  1:29 PM 01/04/2022  ?  1:45 PM 12/23/2021  ?  3:18 PM  ?Weight /BMI  ?Weight 248 lb 12.8 oz 243 lb 0.6 oz 246 lb  ?Height 5\' 5"  (1.651 m) 5\' 4"  (1.626 m) 5\' 5"  (1.651 m)  ?BMI 41.4 kg/m2 41.72 kg/m2 40.94 kg/m2  ? ? ? ? ?

## 2022-02-15 NOTE — Patient Instructions (Signed)
Keep physical appointment please ? ?Work note to return with no restrictions today please ? ?Increase spironolactone to 100 mg daily ? ?Foot exam qualifies you for diabetic shoes , due to  callus, check [facility] ? ?Form is completed and will be returned   ?

## 2022-02-16 DIAGNOSIS — Z0279 Encounter for issue of other medical certificate: Secondary | ICD-10-CM

## 2022-02-16 NOTE — Telephone Encounter (Signed)
Called patient lvm that forms are ready. ?

## 2022-02-28 ENCOUNTER — Encounter: Payer: Self-pay | Admitting: Family Medicine

## 2022-02-28 ENCOUNTER — Other Ambulatory Visit: Payer: Self-pay | Admitting: Family Medicine

## 2022-02-28 DIAGNOSIS — M79671 Pain in right foot: Secondary | ICD-10-CM

## 2022-02-28 DIAGNOSIS — E118 Type 2 diabetes mellitus with unspecified complications: Secondary | ICD-10-CM

## 2022-03-03 ENCOUNTER — Encounter: Payer: Self-pay | Admitting: Podiatry

## 2022-03-03 ENCOUNTER — Ambulatory Visit: Payer: BC Managed Care – PPO | Admitting: Podiatry

## 2022-03-03 DIAGNOSIS — M7751 Other enthesopathy of right foot: Secondary | ICD-10-CM

## 2022-03-03 DIAGNOSIS — M722 Plantar fascial fibromatosis: Secondary | ICD-10-CM | POA: Diagnosis not present

## 2022-03-03 LAB — HM DIABETES EYE EXAM

## 2022-03-03 MED ORDER — TRIAMCINOLONE ACETONIDE 10 MG/ML IJ SUSP
20.0000 mg | Freq: Once | INTRAMUSCULAR | Status: AC
  Administered 2022-03-03: 20 mg

## 2022-03-03 NOTE — Progress Notes (Signed)
Subjective:  ? ?Patient ID: Glenda Thompson, female   DOB: 51 y.o.   MRN: [id number]  ? ?HPI ?Patient presents stating she is developed a lot of pain underneath her right heel and the side of the fifth metatarsal bone has fluid in it and has been painful.  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 ? ?ROS ? ? ?   ?Objective:  ?Physical Exam  ?Giller status found to be intact moderate obesity noted with exquisite discomfort plantar aspect right heel at the insertional point tendon calcaneus and inflammation fluid around the fifth metatarsal head right with pain.  Acute ? ?   ?Assessment:  ?Plantar fasciitis right along with inflammatory capsulitis fifth MPJ right ? ?   ?Plan:  ?H&P x-ray reviewed today I went ahead did sterile prep injected the plantar fascia 3 mg Kenalog 5 mg Xylocaine at the insertion into the calcaneus and I injected the fifth MPJ 3 mg Dexasone Kenalog 5 mg Xylocaine applied a reverse dancers pad to take pressure off the fifth metatarsal and a plantar fascial brace to lift up the arch and take stress off the plantar heel.  Patient will wear supportive shoes and we will consider orthotics and reappoint in 2 weeks ?   ? ? ?

## 2022-03-06 ENCOUNTER — Encounter (HOSPITAL_COMMUNITY): Payer: Self-pay

## 2022-03-06 ENCOUNTER — Emergency Department (HOSPITAL_COMMUNITY)
Admission: EM | Admit: 2022-03-06 | Discharge: 2022-03-07 | Disposition: A | Discharge status: Home / Self Care | Payer: BC Managed Care – PPO | Attending: Emergency Medicine | Admitting: Emergency Medicine

## 2022-03-06 ENCOUNTER — Emergency Department (HOSPITAL_COMMUNITY): Payer: BC Managed Care – PPO

## 2022-03-06 ENCOUNTER — Other Ambulatory Visit: Payer: Self-pay

## 2022-03-06 DIAGNOSIS — I10 Essential (primary) hypertension: Secondary | ICD-10-CM | POA: Insufficient documentation

## 2022-03-06 DIAGNOSIS — R4020 Unspecified coma: Secondary | ICD-10-CM | POA: Diagnosis not present

## 2022-03-06 DIAGNOSIS — Z7984 Long term (current) use of oral hypoglycemic drugs: Secondary | ICD-10-CM | POA: Diagnosis not present

## 2022-03-06 DIAGNOSIS — E119 Type 2 diabetes mellitus without complications: Secondary | ICD-10-CM | POA: Insufficient documentation

## 2022-03-06 DIAGNOSIS — I700 Atherosclerosis of aorta: Secondary | ICD-10-CM | POA: Diagnosis not present

## 2022-03-06 DIAGNOSIS — R55 Syncope and collapse: Secondary | ICD-10-CM | POA: Insufficient documentation

## 2022-03-06 DIAGNOSIS — R42 Dizziness and giddiness: Secondary | ICD-10-CM | POA: Diagnosis not present

## 2022-03-06 DIAGNOSIS — R1084 Generalized abdominal pain: Secondary | ICD-10-CM | POA: Diagnosis not present

## 2022-03-06 DIAGNOSIS — I959 Hypotension, unspecified: Secondary | ICD-10-CM | POA: Diagnosis not present

## 2022-03-06 DIAGNOSIS — R110 Nausea: Secondary | ICD-10-CM | POA: Diagnosis not present

## 2022-03-06 DIAGNOSIS — Z794 Long term (current) use of insulin: Secondary | ICD-10-CM | POA: Diagnosis not present

## 2022-03-06 DIAGNOSIS — T675XXA Heat exhaustion, unspecified, initial encounter: Secondary | ICD-10-CM

## 2022-03-06 DIAGNOSIS — R1013 Epigastric pain: Secondary | ICD-10-CM | POA: Insufficient documentation

## 2022-03-06 DIAGNOSIS — Z79899 Other long term (current) drug therapy: Secondary | ICD-10-CM | POA: Insufficient documentation

## 2022-03-06 DIAGNOSIS — M47814 Spondylosis without myelopathy or radiculopathy, thoracic region: Secondary | ICD-10-CM | POA: Diagnosis not present

## 2022-03-06 LAB — COMPREHENSIVE METABOLIC PANEL
ALT: 19 U/L (ref 0–44)
AST: 20 U/L (ref 15–41)
Albumin: 4 g/dL (ref 3.5–5.0)
Alkaline Phosphatase: 59 U/L (ref 38–126)
Anion gap: 7 (ref 5–15)
BUN: 29 mg/dL — ABNORMAL HIGH (ref 6–20)
CO2: 23 mmol/L (ref 22–32)
Calcium: 9.2 mg/dL (ref 8.9–10.3)
Chloride: 108 mmol/L (ref 98–111)
Creatinine, Ser: 1.48 mg/dL — ABNORMAL HIGH (ref 0.44–1.00)
GFR, Estimated: 43 mL/min — ABNORMAL LOW (ref >60)
Glucose, Bld: 199 mg/dL — ABNORMAL HIGH (ref 70–99)
Potassium: 4.1 mmol/L (ref 3.5–5.1)
Sodium: 138 mmol/L (ref 135–145)
Total Bilirubin: 0.7 mg/dL (ref 0.3–1.2)
Total Protein: 7 g/dL (ref 6.5–8.1)

## 2022-03-06 LAB — I-STAT CHEM 8, ED
BUN: 30 mg/dL — ABNORMAL HIGH (ref 6–20)
Calcium, Ion: 1.22 mmol/L (ref 1.15–1.40)
Chloride: 106 mmol/L (ref 98–111)
Creatinine, Ser: 1.5 mg/dL — ABNORMAL HIGH (ref 0.44–1.00)
Glucose, Bld: 197 mg/dL — ABNORMAL HIGH (ref 70–99)
HCT: 38 % (ref 36.0–46.0)
Hemoglobin: 12.9 g/dL (ref 12.0–15.0)
Potassium: 4.1 mmol/L (ref 3.5–5.1)
Sodium: 138 mmol/L (ref 135–145)
TCO2: 22 mmol/L (ref 22–32)

## 2022-03-06 LAB — CBC WITH DIFFERENTIAL/PLATELET
Abs Immature Granulocytes: 0.07 10*3/uL (ref 0.00–0.07)
Basophils Absolute: 0 10*3/uL (ref 0.0–0.1)
Basophils Relative: 0 %
Eosinophils Absolute: 0.1 10*3/uL (ref 0.0–0.5)
Eosinophils Relative: 1 %
HCT: 35.9 % — ABNORMAL LOW (ref 36.0–46.0)
Hemoglobin: 11.9 g/dL — ABNORMAL LOW (ref 12.0–15.0)
Immature Granulocytes: 1 %
Lymphocytes Relative: 16 %
Lymphs Abs: 2.1 10*3/uL (ref 0.7–4.0)
MCH: 28.1 pg (ref 26.0–34.0)
MCHC: 33.1 g/dL (ref 30.0–36.0)
MCV: 84.9 fL (ref 80.0–100.0)
Monocytes Absolute: 0.8 10*3/uL (ref 0.1–1.0)
Monocytes Relative: 6 %
Neutro Abs: 9.9 10*3/uL — ABNORMAL HIGH (ref 1.7–7.7)
Neutrophils Relative %: 76 %
Platelets: 274 10*3/uL (ref 150–400)
RBC: 4.23 MIL/uL (ref 3.87–5.11)
RDW: 12.9 % (ref 11.5–15.5)
WBC: 13 10*3/uL — ABNORMAL HIGH (ref 4.0–10.5)
nRBC: 0 % (ref 0.0–0.2)

## 2022-03-06 LAB — TROPONIN I (HIGH SENSITIVITY)
Troponin I (High Sensitivity): 6 ng/L (ref <18)
Troponin I (High Sensitivity): 8 ng/L (ref <18)

## 2022-03-06 LAB — LIPASE, BLOOD: Lipase: 33 U/L (ref 11–51)

## 2022-03-06 LAB — I-STAT BETA HCG BLOOD, ED (MC, WL, AP ONLY): I-stat hCG, quantitative: <5 m[IU]/mL (ref <5)

## 2022-03-06 LAB — CK: Total CK: 141 U/L (ref 38–234)

## 2022-03-06 MED ORDER — SODIUM CHLORIDE 0.9 % IV BOLUS
1000.0000 mL | Freq: Once | INTRAVENOUS | Status: AC
  Administered 2022-03-06: 1000 mL via INTRAVENOUS

## 2022-03-06 MED ORDER — IOHEXOL 350 MG/ML SOLN
100.0000 mL | Freq: Once | INTRAVENOUS | Status: AC | PRN
Start: 2022-03-06 — End: 2022-03-06
  Administered 2022-03-06: 100 mL via INTRAVENOUS

## 2022-03-06 NOTE — ED Notes (Signed)
Pt ambulated to restroom independently without any difficulties or complaints.  ?

## 2022-03-06 NOTE — ED Notes (Signed)
Patient transported to CT 

## 2022-03-06 NOTE — Discharge Instructions (Addendum)
You likely were dehydrated causing you to pass out. ? ?Your work-up in the ED essentially is normal ? ?Please stay hydrated ? ?See your doctor for follow up  ? ?Return to ER if you pass out, chest pain.  ?

## 2022-03-06 NOTE — ED Triage Notes (Signed)
Pt BIB [facility] from home c/o dizziness and abdominal pain. Pt stated she was outside since 3pm today. Per EMS pt's sister stated she found pt "passed out and not breathing" and did 2 min of CPR. EMS did not witness but said pt was A&Ox4 and ambulatory at scene. ?HX-T2D, HTN ?

## 2022-03-06 NOTE — ED Provider Notes (Signed)
?[facility] ?Provider Note ? ? ?CSN: [id number] ?Arrival date & time: 03/06/22  2031 ? ?  ? ?History ? ?Chief Complaint  ?Patient presents with  ? Loss of Consciousness  ? ? ?Glenda Thompson is a 51 y.o. female hx of HTN, DM, here presenting with loss of consciousness.  Patient was outside playing corn halls and was dehydrated.  She states that she came inside and felt very lightheaded and dizzy had abdominal pain at that time.  She states that her family was there and apparently she passed out.  They did chest compressions for 2 minutes and got her back.  She states that she is feeling fine now.  She received 1 L normal saline bolus by EMS.  She states that she had heat exhaustion previously with similar symptoms. ? ?The history is provided by the patient.  ? ?  ? ?Home Medications ?Prior to Admission medications   ?Medication Sig Start Date End Date Taking? Authorizing Provider  ?amLODipine (NORVASC) 10 MG tablet TAKE 1 TABLET(10 MG) BY MOUTH DAILY 08/27/21   Fayrene Helper, MD  ?blood glucose meter kit and supplies Three times daily dx E11.65 Freestyle Lite 01/28/20   Fayrene Helper, MD  ?fluticasone [facility]) 50 MCG/ACT nasal spray SPRAY 2 SPRAYS INTO EACH NOSTRIL EVERY DAY 08/16/21   Fayrene Helper, MD  ?glipiZIDE (GLUCOTROL XL) 2.5 MG 24 hr tablet TAKE 1 TABLET BY MOUTH EVERY DAY WITH BREAKFAST 02/15/22   Fayrene Helper, MD  ?glucose blood (FREESTYLE LITE) test strip USE TO TEST THREE TIMES DAILY 08/30/18   Cassandria Anger, MD  ?Insulin Pen Needle (B-D ULTRAFINE III SHORT PEN) 31G X 8 MM MISC Use as directed to inject insulin daily. 06/28/18   Fayrene Helper, MD  ?Lancets (FREESTYLE) lancets USE TO OBTAIN A BLOOD SPECIMEN TO TEST BLOOD SUGAR TWICE A DAY 02/08/19   Fayrene Helper, MD  ?lisinopril-hydrochlorothiazide (ZESTORETIC) 20-12.5 MG tablet TAKE 2 TABLETS BY MOUTH DAILY 08/27/21   Fayrene Helper, MD  ?metFORMIN (GLUCOPHAGE-XR) 500 MG 24  hr tablet TAKE 1 TABLET BY MOUTH TWICE A DAY 10/29/21   Lindell Spar, MD  ?Multiple Vitamin (MULTIVITAMIN WITH MINERALS) TABS tablet Take 1 tablet by mouth daily.    [provider]  ?rosuvastatin (CRESTOR) 5 MG tablet TAKE 1 TABLET BY MOUTH EVERY DAY 10/29/21   Lindell Spar, MD  ?Semaglutide [facility]) 14 MG TABS  02/15/22   Fayrene Helper, MD  ?spironolactone (ALDACTONE) 100 MG tablet Take 1 tablet (100 mg total) by mouth daily. 02/15/22   Fayrene Helper, MD  ?   ? ?Allergies    ?Patient has no known allergies.   ? ?Review of Systems   ?Review of Systems  ?Gastrointestinal:  Positive for abdominal pain.  ?Neurological:  Positive for syncope.  ?All other systems reviewed and are negative. ? ?Physical Exam ?Updated Vital Signs ?BP (!) 143/62 (BP Location: Right Arm)   Pulse 91   Temp 98.2 ?F (36.8 ?C) (Oral)   Resp 19   Ht _0  (1.651 m)   Wt 112.5 kg   LMP 11/02/2018   SpO2 100%   BMI 41.27 kg/m?  ?Physical Exam ?Vitals and nursing note reviewed.  ?Constitutional:   ?   Comments: Awake and alert and slightly dehydrated  ?HENT:  ?   Head: Normocephalic.  ?   Nose: Nose normal.  ?   Mouth/Throat:  ?   Mouth: Mucous membranes  are dry.  ?Eyes:  ?   Extraocular Movements: Extraocular movements intact.  ?   Pupils: Pupils are equal, round, and reactive to light.  ?Cardiovascular:  ?   Rate and Rhythm: Normal rate and regular rhythm.  ?   Pulses: Normal pulses.  ?   Heart sounds: Normal heart sounds.  ?Pulmonary:  ?   Effort: Pulmonary effort is normal.  ?   Breath sounds: Normal breath sounds.  ?Abdominal:  ?   General: Abdomen is flat.  ?   Palpations: Abdomen is soft.  ?   Comments: Mild epigastric tenderness  ?Musculoskeletal:     ?   General: Normal range of motion.  ?   Cervical back: Normal range of motion and neck supple.  ?Skin: ?   General: Skin is warm.  ?   Capillary Refill: Capillary refill takes less than 2 seconds.  ?Neurological:  ?   General: No focal deficit present.  ?    Mental Status: She is oriented to person, place, and time.  ?   Cranial Nerves: No cranial nerve deficit.  ?   Motor: No weakness.  ?Psychiatric:     ?   Mood and Affect: Mood normal.     ?   Behavior: Behavior normal.  ? ? ?ED Results / Procedures / Treatments   ?Labs ?(all labs ordered are listed, but only abnormal results are displayed) ?Labs Reviewed  ?CBC WITH DIFFERENTIAL/PLATELET - Abnormal; Notable for the following components:  ?    Result Value  ? WBC 13.0 (*)   ? Hemoglobin 11.9 (*)   ? HCT 35.9 (*)   ? Neutro Abs 9.9 (*)   ? All other components within normal limits  ?I-STAT CHEM 8, ED - Abnormal; Notable for the following components:  ? BUN 30 (*)   ? Creatinine, Ser 1.50 (*)   ? Glucose, Bld 197 (*)   ? All other components within normal limits  ?COMPREHENSIVE METABOLIC PANEL  ?LIPASE, BLOOD  ?CK  ?I-STAT BETA HCG BLOOD, ED ([facility], [facility], [facility] ONLY)  ?TROPONIN I (HIGH SENSITIVITY)  ? ? ?EKG ?EKG Interpretation ? ?Date/Time:  Sunday Mar 06 2022 20:49:46 EDT ?Ventricular Rate:  93 ?PR Interval:  171 ?QRS Duration: 94 ?QT Interval:  340 ?QTC Calculation: 423 ?R Axis:   39 ?Text Interpretation: Normal sinus rhythm Confirmed by Wandra Arthurs [phone]) on 03/06/2022 9:07:47 PM ? ?Radiology ?No results found. ? ?Procedures ?Procedures  ? ? ?Medications Ordered in ED ?Medications  ?sodium chloride 0.9 % bolus 1,000 mL (1,000 mLs Intravenous New Bag/Given 03/06/22 2109)  ? ? ?ED Course/ Medical Decision Making/ A&P ?  ?                        ?Medical Decision Making ?Glenda Thompson is a 51 y.o. female here with abdominal pain and possible loss of consciousness. Patient had received 2 minutes of CPR by family and was awake and alert when EMS got there.  She may have a vasovagal syncopal episode.  However I am concerned for possible dissection or head bleed.  We will get CBC and CMP and CK level and CT head and CT dissection study. ?  ?11:05 PM ?I have reviewed patient's labs and also independently interpreted her CT scans.   Patient's initial trop was normal. CT dissection study is negative.  CT head is negative.  Patient does not have any chest pain.  I think patient likely has vasovagal syncope  or heat exhaustion.  Signed out to Dr. Dolly Rias in the ED.  If second troponin is negative, dissipate discharge home.  ? ?Problems Addressed: ?Syncope, unspecified syncope type: acute illness or injury ? ?Amount and/or Complexity of Data Reviewed ?Labs: ordered. ?Radiology: ordered. ?ECG/medicine tests: ordered. ? ?Risk ?Prescription drug management. ? ? ? ?Final Clinical Impression(s) / ED Diagnoses ?Final diagnoses:  ?None  ? ? ?Rx / DC Orders ?ED Discharge Orders   ? ? None  ? ?  ? ? ?  ?Drenda Freeze, MD ?03/06/22 2322 ? ?

## 2022-03-06 NOTE — ED Provider Notes (Signed)
11:14 PM ?Assumed care from Dr. Darl Householder, please see their note for full history, physical and decision making until this point. In brief this is a 51 y.o. year old female who presented to the ED tonight with Loss of Consciousness ?    ?LOC. Feels great. Pending troponin, fluids reeval and go home.  ? ?Trop ok. Patient feels well. Will fu w/ PCP for further management. Work note provided.  ? ?Discharge instructions, including strict return precautions for new or worsening symptoms, given. Patient and/or family verbalized understanding and agreement with the plan as described.  ? ?Labs, studies and imaging reviewed by myself and considered in medical decision making if ordered. Imaging interpreted by radiology. ? ?Labs Reviewed  ?CBC WITH DIFFERENTIAL/PLATELET - Abnormal; Notable for the following components:  ?    Result Value  ? WBC 13.0 (*)   ? Hemoglobin 11.9 (*)   ? HCT 35.9 (*)   ? Neutro Abs 9.9 (*)   ? All other components within normal limits  ?COMPREHENSIVE METABOLIC PANEL - Abnormal; Notable for the following components:  ? Glucose, Bld 199 (*)   ? BUN 29 (*)   ? Creatinine, Ser 1.48 (*)   ? GFR, Estimated 43 (*)   ? All other components within normal limits  ?I-STAT CHEM 8, ED - Abnormal; Notable for the following components:  ? BUN 30 (*)   ? Creatinine, Ser 1.50 (*)   ? Glucose, Bld 197 (*)   ? All other components within normal limits  ?LIPASE, BLOOD  ?CK  ?I-STAT BETA HCG BLOOD, ED ([facility], [facility], [facility] ONLY)  ?TROPONIN I (HIGH SENSITIVITY)  ?TROPONIN I (HIGH SENSITIVITY)  ? ? ?CT HEAD WO CONTRAST (5MM)  ?Final Result  ?  ?CT Angio Chest/Abd/Pel for Dissection W and/or Wo Contrast  ?Final Result  ?  ?DG Chest Port 1 View  ?Final Result  ?  ? ? ?No follow-ups on file. ? ?  ?Merrily Pew, MD ?03/07/22 [phone] ? ?

## 2022-03-07 NOTE — ED Notes (Signed)
Patient verbalizes understanding of d/c instructions. Opportunities for questions and answers were provided. Pt d/c from ED and ambulated to lobby with family.  

## 2022-03-08 ENCOUNTER — Ambulatory Visit: Payer: BC Managed Care – PPO | Admitting: Nurse Practitioner

## 2022-03-08 ENCOUNTER — Encounter: Payer: Self-pay | Admitting: Nurse Practitioner

## 2022-03-08 VITALS — BP 138/78 | HR 85 | Ht 65.0 in | Wt 242.0 lb

## 2022-03-08 DIAGNOSIS — R55 Syncope and collapse: Secondary | ICD-10-CM | POA: Diagnosis not present

## 2022-03-08 DIAGNOSIS — Z09 Encounter for follow-up examination after completed treatment for conditions other than malignant neoplasm: Secondary | ICD-10-CM | POA: Insufficient documentation

## 2022-03-08 DIAGNOSIS — I10 Essential (primary) hypertension: Secondary | ICD-10-CM

## 2022-03-08 HISTORY — DX: Syncope and collapse: R55

## 2022-03-08 NOTE — Assessment & Plan Note (Signed)
Recent ER visit for syncope ?After visit summary, labs, imaging, and EKG results done at the ED was reviewed by me today. ?Patient currently denies syncope, dizziness, chest pain, weakness ?

## 2022-03-08 NOTE — Progress Notes (Signed)
? ?  [name]     MRN: [id number]      DOB: 29-May-1971 ? ? ?HPI ?Glenda Thompson with past medical history of type 2 diabetes with vascular disease, essential hypertension, mixed hyperlipidemia, morbid obesity is here for follow-up for ER visit for loss of consciousness.patient stated that 2 days ago, she was outside playing felt dehydrated when she got inside the house she felt lightheaded and dizzy had  loss of consciousness, requiring CPR which was done by her family ,family called the ambulance and she was taken to [facility].  In the ER patient received sodium chloride infusion, EKG and CT done with normal, labs [place] within normal limits.  ?Patient currently denies dizziness, chest pain, syncope states that I am feeling fine.  Patient needs a note to return to work on May 19.  ? ? ? ? ? ?ROS ?Denies recent fever or chills. ?Denies sinus pressure, nasal congestion, ear pain or sore throat. ?Denies chest congestion, productive cough or wheezing. ?Denies chest pains, palpitations and leg swelling ?Denies abdominal pain, nausea, vomiting,diarrhea or constipation.   ?Denies dysuria, frequency, hesitancy or incontinence. ?Denies joint pain, swelling and limitation in mobility. ?Denies headaches, seizures, numbness, or tingling. ?Denies depression, anxiety or insomnia. ? ? ? ?PE ? ?BP 138/78   Pulse 85   Ht 5\' 5"  (1.651 m)   Wt 242 lb (109.8 kg)   LMP 11/02/2018   SpO2 100%   BMI 40.27 kg/m?  ? ?Patient alert and oriented and in no cardiopulmonary distress. ? ?Chest: Clear to auscultation bilaterally. ? ?CVS: S1, S2 no murmurs, no S3.Regular rate. ? ?ABD: Soft non tender.  ? ?Ext: No edema ? ?MS: Adequate ROM spine, shoulders, hips and knees.. ? ?Psych: Good eye contact, normal affect. Memory intact not anxious or depressed appearing. ? ?CNS: CN 2-12 intact, power,  normal throughout.no focal deficits noted. ? ? ?Assessment & Plan ? ?Syncope ?Unspecified ?Went to the ER 2 days ago ?Was treated for  dehydration, patient encouraged to drink at least 64 ounces of water daily to avoid dehydration ?EKG, CT unremarkable ?Labs within within normal limits ?Today patient denies syncope, dizziness, chest pain ?Would like to go back to work work note provided ?Patient encouraged to monitor her blood pressure at home goal is less than 130/80, monitor blood sugar at home and report any readings less than 70 she verbalized understanding. ? ?Essential hypertension, benign ?BP Readings from Last 3 Encounters:  ?03/08/22 138/78  ?03/07/22 140/62  ?02/15/22 (!) 142/82  ?Chronic condition uncontrolled ?Currently on Aldactone 100 mg daily, lisinopril-hydrochlorothiazide 20-12.5 mg 2 tablets daily, amlodipine 10 mg daily ?Goal is for BP less than 130/80 ?Patient encouraged to monitor blood pressure at home ?DASH diet advised engage in daily exercises at least 30 minutes 5 days a week as tolerated ? ?Encounter for examination following treatment at hospital ?Recent ER visit for syncope ?After visit summary, labs, imaging, and EKG results done at the ED was reviewed by me today. ?Patient currently denies syncope, dizziness, chest pain, weakness  ?

## 2022-03-08 NOTE — Assessment & Plan Note (Addendum)
Unspecified ?Went to the ER 2 days ago ?Was treated for dehydration, patient encouraged to drink at least 64 ounces of water daily to avoid dehydration ?EKG, CT unremarkable ?Labs within within normal limits ?Today patient denies syncope, dizziness, chest pain ?Would like to go back to work work note provided ?Patient encouraged to monitor her blood pressure at home goal is less than 130/80, monitor blood sugar at home and report any readings less than 70 she verbalized understanding. ?

## 2022-03-08 NOTE — Patient Instructions (Signed)
Goal for fasting blood sugar ranges from 80 to 120 and 2 hours after any meal or at bedtime should be between 130 to 170.  ? ?Goal for blood pressure is 130/80.  ? ?It is important that you exercise regularly at least 30 minutes 5 times a week.  ?Think about what you will eat, plan ahead. ?Choose " clean, green, fresh or frozen" over canned, processed or packaged foods which are more sugary, salty and fatty. ?70 to 75% of food eaten should be vegetables and fruit. ?Three meals at set times with snacks allowed between meals, but they must be fruit or vegetables. ?Aim to eat over a 12 hour period , example 7 am to 7 pm, and STOP after  your last meal of the day. ?Drink water,generally about 64 ounces per day, no other drink is as healthy. Fruit juice is best enjoyed in a healthy way, by EATING the fruit. ? ?Thanks for choosing [facility], we consider it a privelige to serve you.  ?

## 2022-03-08 NOTE — Assessment & Plan Note (Signed)
BP Readings from Last 3 Encounters:  ?03/08/22 138/78  ?03/07/22 140/62  ?02/15/22 (!) 142/82  ?Chronic condition uncontrolled ?Currently on Aldactone 100 mg daily, lisinopril-hydrochlorothiazide 20-12.5 mg 2 tablets daily, amlodipine 10 mg daily ?Goal is for BP less than 130/80 ?Patient encouraged to monitor blood pressure at home ?DASH diet advised engage in daily exercises at least 30 minutes 5 days a week as tolerated ?

## 2022-03-09 ENCOUNTER — Encounter: Payer: Self-pay | Admitting: Family Medicine

## 2022-03-13 ENCOUNTER — Other Ambulatory Visit: Payer: Self-pay | Admitting: Family Medicine

## 2022-03-14 ENCOUNTER — Telehealth: Payer: Self-pay

## 2022-03-14 NOTE — Telephone Encounter (Signed)
FMLA forms  (appt for CPE 05.26.2023)  Copied Noted sleeved

## 2022-03-14 NOTE — Telephone Encounter (Signed)
Forms for DOS 05.16.2023 ED follow up  Need to extend the out of work dates 05.15.2023-05.18.2023. Patient went back to work on the 05.19.2023.  Copied Noted sleeved

## 2022-03-17 ENCOUNTER — Encounter: Payer: Self-pay | Admitting: Podiatry

## 2022-03-17 ENCOUNTER — Ambulatory Visit: Payer: BC Managed Care – PPO

## 2022-03-17 ENCOUNTER — Ambulatory Visit (INDEPENDENT_AMBULATORY_CARE_PROVIDER_SITE_OTHER): Payer: Worker's Compensation | Admitting: Podiatry

## 2022-03-17 DIAGNOSIS — M722 Plantar fascial fibromatosis: Secondary | ICD-10-CM

## 2022-03-17 DIAGNOSIS — L84 Corns and callosities: Secondary | ICD-10-CM

## 2022-03-17 DIAGNOSIS — M7751 Other enthesopathy of right foot: Secondary | ICD-10-CM

## 2022-03-17 DIAGNOSIS — E0843 Diabetes mellitus due to underlying condition with diabetic autonomic (poly)neuropathy: Secondary | ICD-10-CM

## 2022-03-17 NOTE — Progress Notes (Signed)
Subjective:   Patient ID: Glenda Thompson, female   DOB: 51 y.o.   MRN: [id number]   HPI Patient states she is somewhat improved but she still has a lot of discomfort in her heel at work with cement floors and on the outside of her right foot neuro   ROS      Objective:  Physical Exam  Vascular status intact with discomfort of the fifth metatarsal head right and the plantar fascia still present     Assessment:  Acute fasciitis along with fifth metatarsal head pressure of the right foot     Plan:  H&P discussed condition I recommended orthotics to try to offload the plantar heel fifth metatarsal and patient is casted for functional orthotic devices.  Patient will be seen back to recheck when returned and this was done by pedorthist who will dispense orthotics and hopefully the heel pain will remain under control

## 2022-03-17 NOTE — Progress Notes (Signed)
SITUATION Reason for Consult: Evaluation for Bilateral Custom Foot Orthoses Patient / Caregiver Report: Patient is ready for foot orthotics  OBJECTIVE DATA: Patient History / Diagnosis:    ICD-10-CM   1. Plantar fasciitis  M72.2     2. Capsulitis of metatarsophalangeal (MTP) joint of right foot  M77.51     3. Diabetes mellitus due to underlying condition with diabetic autonomic neuropathy, unspecified whether long term insulin use (HCC)  E08.43     4. Pre-ulcerative calluses  L84       Current or Previous Devices:   No  Foot Examination: Skin presentation:   Intact Ulcers & Callousing:   Callusing 5th right met Toe / Foot Deformities:  None Weight Bearing Presentation:  Rectus Sensation:    Intact  Shoe Size:    9W  ORTHOTIC RECOMMENDATION Recommended Device: 1x pair of custom functional foot orthotics  GOALS OF ORTHOSES - Reduce Pain - Prevent Foot Deformity - Prevent Progression of Further Foot Deformity - Relieve Pressure - Improve the Overall Biomechanical Function of the Foot and Lower Extremity.  ACTIONS PERFORMED Potential out of pocket cost was communicated to patient. Patient understood and consent to casting. Patient was casted for Foot Orthoses via crush box. Procedure was explained and patient tolerated procedure well. Casts were shipped to central fabrication. All questions were answered and concerns addressed.  PLAN Patient is to be called for fitting when devices are ready.

## 2022-03-18 ENCOUNTER — Other Ambulatory Visit (HOSPITAL_COMMUNITY)
Admission: RE | Admit: 2022-03-18 | Discharge: 2022-03-18 | Disposition: A | Discharge status: Home / Self Care | Payer: BC Managed Care – PPO | Source: Ambulatory Visit | Attending: Family Medicine | Admitting: Family Medicine

## 2022-03-18 ENCOUNTER — Encounter: Payer: Self-pay | Admitting: Family Medicine

## 2022-03-18 ENCOUNTER — Ambulatory Visit (INDEPENDENT_AMBULATORY_CARE_PROVIDER_SITE_OTHER): Payer: BC Managed Care – PPO | Admitting: Family Medicine

## 2022-03-18 VITALS — BP 124/80 | HR 92 | Resp 16 | Ht 65.0 in | Wt 244.1 lb

## 2022-03-18 DIAGNOSIS — Z0000 Encounter for general adult medical examination without abnormal findings: Secondary | ICD-10-CM | POA: Diagnosis not present

## 2022-03-18 DIAGNOSIS — Z0279 Encounter for issue of other medical certificate: Secondary | ICD-10-CM

## 2022-03-18 DIAGNOSIS — Z124 Encounter for screening for malignant neoplasm of cervix: Secondary | ICD-10-CM | POA: Diagnosis not present

## 2022-03-18 DIAGNOSIS — E782 Mixed hyperlipidemia: Secondary | ICD-10-CM | POA: Diagnosis not present

## 2022-03-18 DIAGNOSIS — E1159 Type 2 diabetes mellitus with other circulatory complications: Secondary | ICD-10-CM | POA: Diagnosis not present

## 2022-03-18 DIAGNOSIS — Z23 Encounter for immunization: Secondary | ICD-10-CM | POA: Diagnosis not present

## 2022-03-18 DIAGNOSIS — E559 Vitamin D deficiency, unspecified: Secondary | ICD-10-CM

## 2022-03-18 DIAGNOSIS — I10 Essential (primary) hypertension: Secondary | ICD-10-CM

## 2022-03-18 NOTE — Assessment & Plan Note (Signed)
After obtaining informed consent, the vaccine is  administered , with no adverse effect noted at the time of administration.  

## 2022-03-18 NOTE — Patient Instructions (Signed)
F/U in mid July, call if you need me sooner  TdAP in office today  Please get fasting lipid, cmp and eGFr , hBa1C, tSH and vit D 5 days before next appointment  Ensure you drink 64 ounces water daily  It is important that you exercise regularly at least 30 minutes 5 times a week. If you develop chest pain, have severe difficulty breathing, or feel very tired, stop exercising immediately and seek medical attention   Think about what you will eat, plan ahead. Choose " clean, green, fresh or frozen" over canned, processed or packaged foods which are more sugary, salty and fatty. 70 to 75% of food eaten should be vegetables and fruit. Three meals at set times with snacks allowed between meals, but they must be fruit or vegetables. Aim to eat over a 12 hour period , example 7 am to 7 pm, and STOP after  your last meal of the day. Drink water,generally about 64 ounces per day, no other drink is as healthy. Fruit juice is best enjoyed in a healthy way, by EATING the fruit. Thanks for choosing [facility], we consider it a privelige to serve you.

## 2022-03-18 NOTE — Telephone Encounter (Signed)
Called patient forms were faxed and copy at front desk for patient to pick up

## 2022-03-18 NOTE — Telephone Encounter (Signed)
Patient had an appt 05.26.2023 handed forms to patient.

## 2022-03-18 NOTE — Assessment & Plan Note (Signed)

## 2022-03-18 NOTE — Progress Notes (Unsigned)
    Glenda SINER     MRN: [id number]      DOB: October 22, 1971  HPI: Patient is in for annual physical exam. No other health concerns are expressed or addressed at the visit. Recent labs,  are reviewed. Immunization is reviewed , and  updated if needed.   PE: BP 124/80   Pulse 92   Resp 16   Ht 5\' 5"  (1.651 m)   Wt 244 lb 1.9 oz (110.7 kg)   LMP 11/02/2018   SpO2 98%   BMI 40.62 kg/m   Pleasant  female, alert and oriented x 3, in no cardio-pulmonary distress. Afebrile. HEENT No facial trauma or asymetry. Sinuses non tender.  Extra occullar muscles intact.. External ears normal, . Neck: supple, no adenopathy,JVD or thyromegaly.No bruits.  Chest: Clear to ascultation bilaterally.No crackles or wheezes. Non tender to palpation  Breast: No asymetry,no masses or lumps. No tenderness. No nipple discharge or inversion. No axillary or supraclavicular adenopathy  Cardiovascular system; Heart sounds normal,  S1 and  S2 ,no S3.  No murmur, or thrill. Apical beat not displaced Peripheral pulses normal.  Abdomen: Soft, non tender, no organomegaly or masses. No bruits. Bowel sounds normal. No guarding, tenderness or rebound.   GU: External genitalia normal female genitalia , normal female distribution of hair. No lesions. Urethral meatus normal in size, no  Prolapse, no lesions visibly  Present. Bladder non tender. Vagina pink and moist , with no visible lesions , discharge present . Adequate pelvic support no  cystocele or rectocele noted Cervix pink and appears healthy, no lesions or ulcerations noted, no discharge noted from os Uterus normal size, no adnexal masses, no cervical motion or adnexal tenderness.   Musculoskeletal exam: Full ROM of spine, hips , shoulders and knees. No deformity ,swelling or crepitus noted. No muscle wasting or atrophy.   Neurologic: Cranial nerves 2 to 12 intact. Power, tone ,sensation and reflexes normal throughout. No disturbance in  gait. No tremor.  Skin: Intact, no ulceration, erythema , scaling or rash noted. Pigmentation normal throughout  Psych; Normal mood and affect. Judgement and concentration normal   Assessment & Plan:  Annual physical exam Annual exam as documented. Counseling done  re healthy lifestyle involving commitment to 150 minutes exercise per week, heart healthy diet, and attaining healthy weight.The importance of adequate sleep also discussed. Regular seat belt use and home safety, is also discussed. Changes in health habits are decided on by the patient with goals and time frames  set for achieving them. Immunization and cancer screening needs are specifically addressed at this visit.   Need for Tdap vaccination After obtaining informed consent, the vaccine is  administered , with no adverse effect noted at the time of administration.

## 2022-03-22 ENCOUNTER — Encounter: Payer: Self-pay | Admitting: Family Medicine

## 2022-03-22 ENCOUNTER — Telehealth: Payer: Self-pay | Admitting: Podiatry

## 2022-03-22 NOTE — Telephone Encounter (Signed)
Patient would like intermittent FMLA completed for when she needs to take off from work, due to foot pain. Please advise?

## 2022-03-23 NOTE — Telephone Encounter (Signed)
That should be ok. Not sure how they structure that

## 2022-03-24 ENCOUNTER — Encounter: Payer: Self-pay | Admitting: Family Medicine

## 2022-03-24 ENCOUNTER — Telehealth: Payer: Self-pay

## 2022-03-24 LAB — CYTOLOGY - PAP
Adequacy: ABSENT
Comment: NEGATIVE
Diagnosis: NEGATIVE
High risk HPV: NEGATIVE

## 2022-03-24 NOTE — Telephone Encounter (Signed)
Sent new letter via [employer]

## 2022-03-24 NOTE — Telephone Encounter (Signed)
Return back to work on Tuesday, 03/22/2022 with no restrictions. And the date of the letter needs to say 03/18/2022.

## 2022-03-25 ENCOUNTER — Telehealth: Payer: Self-pay

## 2022-03-25 NOTE — Telephone Encounter (Signed)
REVISED FMLA FORMS PAGE 3  (4A) CLARIFY NEEDED) AND 03/07/22 THRU 03/11/22.  COPIED NOTED SLEEVED

## 2022-03-30 NOTE — Telephone Encounter (Signed)
Forms were faxed and patient picked up a copy

## 2022-04-28 ENCOUNTER — Other Ambulatory Visit: Payer: BC Managed Care – PPO

## 2022-05-05 ENCOUNTER — Ambulatory Visit (INDEPENDENT_AMBULATORY_CARE_PROVIDER_SITE_OTHER): Payer: BC Managed Care – PPO

## 2022-05-05 ENCOUNTER — Encounter: Payer: Self-pay | Admitting: Podiatry

## 2022-05-05 DIAGNOSIS — E0843 Diabetes mellitus due to underlying condition with diabetic autonomic (poly)neuropathy: Secondary | ICD-10-CM

## 2022-05-05 DIAGNOSIS — M7751 Other enthesopathy of right foot: Secondary | ICD-10-CM

## 2022-05-05 DIAGNOSIS — M722 Plantar fascial fibromatosis: Secondary | ICD-10-CM

## 2022-05-06 NOTE — Progress Notes (Cosign Needed)
Reason for Visit:        Fitting and Delivery of [place] Orthotics Patient Report:            Patient reports comfort and is satisfied with device.   ACTIONS PERFORMED Patient was fit with foot orthotics trimmed to shoe last. Patient tolerated fitting procedure.    Patient was provided with verbal and written instruction and demonstration regarding  wear, care, proper fit, function, purpose, cleaning, and use of the orthosis and in all related precautions and risks and benefits regarding the orthosis.   Patient was also provided with verbal instruction regarding how to report any failures or malfunctions of the orthosis and necessary follow up care. Patient was also instructed to contact our office regarding any change in status that may affect the function of the orthosis.   Patient demonstrated independence with proper donning, doffing, and fit and verbalized understanding of all instructions.   PLAN: Patient is to follow up in one week or as necessary (PRN). All questions were answered and concerns addressed.

## 2022-05-12 ENCOUNTER — Other Ambulatory Visit: Payer: Self-pay | Admitting: Internal Medicine

## 2022-05-20 ENCOUNTER — Ambulatory Visit: Payer: BC Managed Care – PPO | Admitting: Family Medicine

## 2022-06-02 ENCOUNTER — Ambulatory Visit (HOSPITAL_COMMUNITY): Payer: BC Managed Care – PPO

## 2022-06-10 ENCOUNTER — Ambulatory Visit (HOSPITAL_COMMUNITY)
Admission: RE | Admit: 2022-06-10 | Discharge: 2022-06-10 | Disposition: A | Discharge status: Home / Self Care | Payer: BC Managed Care – PPO | Source: Ambulatory Visit | Attending: Family Medicine | Admitting: Family Medicine

## 2022-06-10 DIAGNOSIS — E782 Mixed hyperlipidemia: Secondary | ICD-10-CM | POA: Diagnosis not present

## 2022-06-10 DIAGNOSIS — E559 Vitamin D deficiency, unspecified: Secondary | ICD-10-CM | POA: Diagnosis not present

## 2022-06-10 DIAGNOSIS — Z1231 Encounter for screening mammogram for malignant neoplasm of breast: Secondary | ICD-10-CM | POA: Insufficient documentation

## 2022-06-10 DIAGNOSIS — I10 Essential (primary) hypertension: Secondary | ICD-10-CM | POA: Diagnosis not present

## 2022-06-10 DIAGNOSIS — E1159 Type 2 diabetes mellitus with other circulatory complications: Secondary | ICD-10-CM | POA: Diagnosis not present

## 2022-06-11 LAB — CMP14+EGFR
ALT: 19 IU/L (ref 0–32)
AST: 16 IU/L (ref 0–40)
Albumin/Globulin Ratio: 2 (ref 1.2–2.2)
Albumin: 4.4 g/dL (ref 3.8–4.9)
Alkaline Phosphatase: 71 IU/L (ref 44–121)
BUN/Creatinine Ratio: 22 (ref 9–23)
BUN: 27 mg/dL — ABNORMAL HIGH (ref 6–24)
Bilirubin Total: 0.4 mg/dL (ref 0.0–1.2)
CO2: 22 mmol/L (ref 20–29)
Calcium: 9.8 mg/dL (ref 8.7–10.2)
Chloride: 101 mmol/L (ref 96–106)
Creatinine, Ser: 1.23 mg/dL — ABNORMAL HIGH (ref 0.57–1.00)
Globulin, Total: 2.2 g/dL (ref 1.5–4.5)
Glucose: 262 mg/dL — ABNORMAL HIGH (ref 70–99)
Potassium: 5.3 mmol/L — ABNORMAL HIGH (ref 3.5–5.2)
Sodium: 138 mmol/L (ref 134–144)
Total Protein: 6.6 g/dL (ref 6.0–8.5)
eGFR: 53 mL/min/{1.73_m2} — ABNORMAL LOW (ref >59)

## 2022-06-11 LAB — LIPID PANEL
Chol/HDL Ratio: 2.5 ratio (ref 0.0–4.4)
Cholesterol, Total: 179 mg/dL (ref 100–199)
HDL: 73 mg/dL (ref >39)
LDL Chol Calc (NIH): 89 mg/dL (ref 0–99)
Triglycerides: 97 mg/dL (ref 0–149)
VLDL Cholesterol Cal: 17 mg/dL (ref 5–40)

## 2022-06-11 LAB — HEMOGLOBIN A1C
Est. average glucose Bld gHb Est-mCnc: 174 mg/dL
Hgb A1c MFr Bld: 7.7 % — ABNORMAL HIGH (ref 4.8–5.6)

## 2022-06-11 LAB — TSH: TSH: 1.36 u[IU]/mL (ref 0.450–4.500)

## 2022-06-11 LAB — VITAMIN D 25 HYDROXY (VIT D DEFICIENCY, FRACTURES): Vit D, 25-Hydroxy: 23.3 ng/mL — ABNORMAL LOW (ref 30.0–100.0)

## 2022-06-14 ENCOUNTER — Other Ambulatory Visit: Payer: Self-pay | Admitting: Family Medicine

## 2022-06-15 ENCOUNTER — Encounter: Payer: Self-pay | Admitting: *Deleted

## 2022-06-15 ENCOUNTER — Other Ambulatory Visit: Payer: Self-pay | Admitting: *Deleted

## 2022-06-15 NOTE — Patient Outreach (Signed)
  Care Coordination   Initial Visit Note   06/15/2022 Name: Glenda Thompson MRN: [id number] DOB: Oct 03, 1971  Glenda Thompson is a 51 y.o. year old female who sees Moshe Cipro, [place] Levo, MD for primary care. I spoke with  Glenda Thompson by phone today  What matters to the patients health and wellness today?  Managing diabetes     Goals Addressed               This Visit's Progress     Patient Stated     manage diabetes [facility]) (pt-stated)        Care Coordination Interventions: Reviewed medications with patient and discussed importance of medication adherence Counseled on importance of regular laboratory monitoring as prescribed Discussed plans with patient for ongoing care management follow up and provided patient with direct contact information for care management team Reviewed scheduled/upcoming provider appointments including: 06/17/22 primary care provider visit Review of patient status, including review of consultants reports, relevant laboratory and other test results, and medications completed Screening for signs and symptoms of depression related to chronic disease state  Assessed social determinant of health barriers Sent patient a list of her BCBS blue choice PPO online covered providers for foot care via e-mail Sent online sites for the list of continuous glucose monitors eligible under her coverage to include the contour, Dexcom 5-8, Eversense, guardian & freestyle libre via my chart Provided information to the pcp related also to the glucose monitors and providers for foot care  Education sent via my chart on diabetes, food planning, exercise/activity        SDOH assessments and interventions completed:  Yes  SDOH Interventions Today    Flowsheet Row Most Recent Value  SDOH Interventions   Food Insecurity Interventions Intervention Not Indicated  Financial Strain Interventions Intervention Not Indicated  Housing Interventions Intervention Not Indicated  Stress  Interventions Intervention Not Indicated  Transportation Interventions Intervention Not Indicated        Care Coordination Interventions Activated:  Yes  Care Coordination Interventions:  Yes, provided   Follow up plan: Follow up call scheduled for 07/18/22    Encounter Outcome:  Pt. Visit Completed   Kiet Geer L. Lavina Hamman, RN, BSN, [place] Coordinator Office number [phone]

## 2022-06-15 NOTE — Patient Outreach (Signed)
  Care Coordination   06/15/2022 Name: Glenda Thompson MRN: [id number] DOB: 22-Dec-1970   Care Coordination Outreach Attempts:  An unsuccessful telephone outreach was attempted today to offer the patient information about available care coordination services as a benefit of their health plan.   Follow Up Plan:  Additional outreach attempts will be made to offer the patient care coordination information and services.   Encounter Outcome:  No Answer  Care Coordination Interventions Activated:  No   Care Coordination Interventions:  No, not indicated    SIG Brodi Kari L. Lavina Hamman, RN, BSN, [place] Coordinator Office number [phone]

## 2022-06-15 NOTE — Patient Instructions (Addendum)
Visit Information  Thank you for taking time to visit with me today. Please don't hesitate to contact me if I can be of assistance to you.   This web site contain some information -[email or web address]  Your coverage allows you to get either the Dexcom G4, G5, G6 or G8, the Eversense, the Guardian or the Freestyle 1 or 2 Libre continuous glucose meter & supplies. Search for each using this site [employer].[email or web address].pdf  Information was forwarded to Dr Moshe Cipro  Following are the goals we discussed today:   Goals Addressed               This Visit's Progress     Patient Stated     manage diabetes [facility]) (pt-stated)        Care Coordination Interventions: Reviewed medications with patient and discussed importance of medication adherence Counseled on importance of regular laboratory monitoring as prescribed Discussed plans with patient for ongoing care management follow up and provided patient with direct contact information for care management team Reviewed scheduled/upcoming provider appointments including: 06/17/22 primary care provider visit Review of patient status, including review of consultants reports, relevant laboratory and other test results, and medications completed Screening for signs and symptoms of depression related to chronic disease state  Assessed social determinant of health barriers Sent patient a list of her BCBS blue choice PPO online covered providers for foot care via e-mail Sent online sites for the list of continuous glucose monitors eligible under her coverage to include the contour, Dexcom 5-8, Eversense, guardian & freestyle libre via my chart Provided information to the pcp related also to the glucose monitors and providers for foot care  Education sent via my chart on diabetes, food planning, exercise/activity        Our next appointment  is by telephone on 07/18/22 at 4:30 pm   Please call the care guide team at [phone] if you need to cancel or reschedule your appointment.   If you are experiencing a Mental Health or Hope or need someone to talk to, please call the Suicide and Crisis Lifeline: 988 call the [place] National Suicide Prevention Lifeline: 360-782-2707 or TTY: 440-643-1614 TTY 931-626-8801) to talk to a trained counselor call 1-800-273-TALK (toll free, 24 hour hotline) call the [facility]: 480-372-4341 call 911   Patient verbalizes understanding of instructions and care plan provided today and agrees to view in [place]. Active MyChart status and patient understanding of how to access instructions and care plan via MyChart confirmed with patient.     The patient has been provided with contact information for the care management team and has been advised to call with any health related questions or concerns.   [place] Lavina Hamman, RN, BSN, [place] Coordinator Office number [phone]

## 2022-06-17 ENCOUNTER — Ambulatory Visit: Payer: BC Managed Care – PPO | Admitting: Family Medicine

## 2022-06-17 ENCOUNTER — Encounter: Payer: Self-pay | Admitting: Family Medicine

## 2022-06-17 VITALS — BP 131/83 | HR 94 | Ht 65.0 in | Wt 251.0 lb

## 2022-06-17 DIAGNOSIS — I10 Essential (primary) hypertension: Secondary | ICD-10-CM | POA: Diagnosis not present

## 2022-06-17 DIAGNOSIS — E1159 Type 2 diabetes mellitus with other circulatory complications: Secondary | ICD-10-CM | POA: Diagnosis not present

## 2022-06-17 DIAGNOSIS — Z23 Encounter for immunization: Secondary | ICD-10-CM

## 2022-06-17 DIAGNOSIS — E782 Mixed hyperlipidemia: Secondary | ICD-10-CM

## 2022-06-17 MED ORDER — GLIPIZIDE ER 5 MG PO TB24
5.0000 mg | ORAL_TABLET | Freq: Every day | ORAL | 3 refills | Status: DC
Start: 2022-06-17 — End: 2022-08-11

## 2022-06-17 MED ORDER — ERGOCALCIFEROL 1.25 MG (50000 UT) PO CAPS: 50000.0000 [IU] | ORAL_CAPSULE | ORAL | 1 refills | Status: DC

## 2022-06-17 MED ORDER — TIRZEPATIDE 2.5 MG/0.5ML ~~LOC~~ SOAJ: 2.5000 mg | SUBCUTANEOUS | 0 refills | Status: DC

## 2022-06-17 NOTE — Patient Instructions (Addendum)
F/U in 6 weeks, call if you need me sooner, bring b blood sugar log  Flu vaccine today  NEED to change food choice to get sugar controlled  Nurse pls work on continuous gluse monitor next week and communicate with her via my chart  Increase glipizide to 5 mg daily  Changed to [employer] weekly from [employer]  Thanks for choosing [facility], we consider it a privelige to serve you.

## 2022-06-22 ENCOUNTER — Telehealth: Payer: Self-pay | Admitting: Family Medicine

## 2022-06-22 ENCOUNTER — Encounter: Payer: Self-pay | Admitting: Family Medicine

## 2022-06-22 ENCOUNTER — Other Ambulatory Visit: Payer: Self-pay | Admitting: Family Medicine

## 2022-06-22 MED ORDER — FREESTYLE LIBRE 2 READER DEVI: 0 refills | Status: DC

## 2022-06-22 MED ORDER — FREESTYLE LIBRE 2 SENSOR MISC: 5 refills | Status: DC

## 2022-06-22 MED ORDER — TIRZEPATIDE 5 MG/0.5ML ~~LOC~~ SOAJ: 5.0000 mg | SUBCUTANEOUS | 0 refills | Status: DC

## 2022-06-22 NOTE — Telephone Encounter (Signed)
Sent CGM to [facility]

## 2022-06-22 NOTE — Assessment & Plan Note (Signed)
Controlled, no change in medication DASH diet and commitment to daily physical activity for a minimum of 30 minutes discussed and encouraged, as a part of hypertension management. The importance of attaining a healthy weight is also discussed.     06/17/2022    4:16 PM 03/18/2022    4:03 PM 03/08/2022    4:42 PM 03/08/2022    4:07 PM 03/07/2022    1:06 AM 03/07/2022   12:45 AM 03/07/2022   12:00 AM  BP/Weight  Systolic BP 601 561 537 [phone] 092  Diastolic BP 83 80 78 84 62 56 57  Wt. (Lbs) 251.04 244.12  242     BMI 41.78 kg/m2 40.62 kg/m2  40.27 kg/m2

## 2022-06-22 NOTE — Assessment & Plan Note (Signed)
Hyperlipidemia:Low fat diet discussed and encouraged.   Lipid Panel  Lab Results  Component Value Date   CHOL 179 06/10/2022   HDL 73 06/10/2022   LDLCALC 89 06/10/2022   TRIG 97 06/10/2022   CHOLHDL 2.5 06/10/2022   Controlled, no change in medication

## 2022-06-22 NOTE — Progress Notes (Signed)
Glenda Thompson     MRN: [id number]      DOB: 26-Nov-1970   HPI Glenda Thompson is here for follow up and re-evaluation of chronic medical conditions, medication management and review of any available recent lab and radiology data.  Preventive health is updated, specifically  Cancer screening and Immunization.   Questions or concerns regarding consultations or procedures which the PT has had in the interim are  addressed. The PT denies any adverse reactions to current medications since the last visit.  Requests change to CGM, also interested in diabetic shoes   ROS Denies recent fever or chills. Denies sinus pressure, nasal congestion, ear pain or sore throat. Denies chest congestion, productive cough or wheezing. Denies chest pains, palpitations and leg swelling Denies abdominal pain, nausea, vomiting,diarrhea or constipation.   Denies dysuria, frequency, hesitancy or incontinence. Denies joint pain, swelling and limitation in mobility. Denies headaches, seizures, numbness, or tingling. Denies depression, anxiety or insomnia. Denies skin break down or rash.   PE  BP 131/83   Pulse 94   Ht 5' 5"  (1.651 m)   Wt 251 lb 0.6 oz (113.9 kg)   LMP 11/02/2017   SpO2 97%   BMI 41.78 kg/m   Patient alert and oriented and in no cardiopulmonary distress.  HEENT: No facial asymmetry, EOMI,     Neck supple .  Chest: Clear to auscultation bilaterally.  CVS: S1, S2 no murmurs, no S3.Regular rate.  ABD: Soft non tender.   Ext: No edema  MS: Adequate ROM spine, shoulders, hips and knees.  Skin: Intact, no ulcerations or rash noted.  Psych: Good eye contact, normal affect. Memory intact not anxious or depressed appearing.  CNS: CN 2-12 intact, power,  normal throughout.no focal deficits noted.   Assessment & Plan  Type 2 diabetes mellitus with vascular disease [facility]) Glenda Thompson is reminded of the importance of commitment to daily physical activity for 30 minutes or more, as  able and the need to limit carbohydrate intake to 30 to 60 grams per meal to help with blood sugar control.   The need to take medication as prescribed, test blood sugar as directed, and to call between visits if there is a concern that blood sugar is uncontrolled is also discussed.   Glenda Thompson is reminded of the importance of daily foot exam, annual eye examination, and good blood sugar, blood pressure and cholesterol control. Uncontrolled, sttar mounjaro and also request CGM, also increase glipizde dose     Latest Ref Rng & Units 06/10/2022    8:34 AM 03/06/2022    9:08 PM 03/06/2022    9:00 PM 01/04/2022    2:45 PM 10/25/2021    9:19 AM  Diabetic Labs  HbA1c 4.8 - 5.6 % 7.7    7.2    Micro/Creat Ratio 0 - 29 mg/g creat     <2   Chol 100 - 199 mg/dL 179       HDL >39 mg/dL 73       Calc LDL 0 - 99 mg/dL 89       Triglycerides 0 - 149 mg/dL 97       Creatinine 0.57 - 1.00 mg/dL 1.23  1.50  1.48  1.04        06/17/2022    4:16 PM 03/18/2022    4:03 PM 03/08/2022    4:42 PM 03/08/2022    4:07 PM 03/07/2022    1:06 AM 03/07/2022   12:45 AM 03/07/2022   12:00  AM  BP/Weight  Systolic BP 314 970 263 785 885 027 741  Diastolic BP 83 80 78 84 62 56 57  Wt. (Lbs) 251.04 244.12  242     BMI 41.78 kg/m2 40.62 kg/m2  40.27 kg/m2         Latest Ref Rng & Units 03/18/2022    4:00 PM 03/03/2022   12:00 AM  Foot/eye exam completion dates  Eye Exam No Retinopathy  No Retinopathy      Foot Form Completion  Done      This result is from an external source.        Essential hypertension, benign Controlled, no change in medication DASH diet and commitment to daily physical activity for a minimum of 30 minutes discussed and encouraged, as a part of hypertension management. The importance of attaining a healthy weight is also discussed.     06/17/2022    4:16 PM 03/18/2022    4:03 PM 03/08/2022    4:42 PM 03/08/2022    4:07 PM 03/07/2022    1:06 AM 03/07/2022   12:45 AM 03/07/2022   12:00 AM   BP/Weight  Systolic BP 287 867 672 094 709 628 366  Diastolic BP 83 80 78 84 62 56 57  Wt. (Lbs) 251.04 244.12  242     BMI 41.78 kg/m2 40.62 kg/m2  40.27 kg/m2          Morbid obesity  Patient re-educated about  the importance of commitment to a  minimum of 150 minutes of exercise per week as able.  The importance of healthy food choices with portion control discussed, as well as eating regularly and within a 12 hour window most days. The need to choose "clean , green" food 50 to 75% of the time is discussed, as well as to make water the primary drink and set a goal of 64 ounces water daily.       06/17/2022    4:16 PM 03/18/2022    4:03 PM 03/08/2022    4:07 PM  Weight /BMI  Weight 251 lb 0.6 oz 244 lb 1.9 oz 242 lb  Height 5' 5"  (1.651 m) 5' 5"  (1.651 m) 5' 5"  (1.651 m)  BMI 41.78 kg/m2 40.62 kg/m2 40.27 kg/m2      Mixed hyperlipidemia Hyperlipidemia:Low fat diet discussed and encouraged.   Lipid Panel  Lab Results  Component Value Date   CHOL 179 06/10/2022   HDL 73 06/10/2022   LDLCALC 89 06/10/2022   TRIG 97 06/10/2022   CHOLHDL 2.5 06/10/2022   Controlled, no change in medication

## 2022-06-22 NOTE — Assessment & Plan Note (Signed)
  Patient re-educated about  the importance of commitment to a  minimum of 150 minutes of exercise per week as able.  The importance of healthy food choices with portion control discussed, as well as eating regularly and within a 12 hour window most days. The need to choose "clean , green" food 50 to 75% of the time is discussed, as well as to make water the primary drink and set a goal of 64 ounces water daily.       06/17/2022    4:16 PM 03/18/2022    4:03 PM 03/08/2022    4:07 PM  Weight /BMI  Weight 251 lb 0.6 oz 244 lb 1.9 oz 242 lb  Height 5' 5"  (1.651 m) 5' 5"  (1.651 m) 5' 5"  (1.651 m)  BMI 41.78 kg/m2 40.62 kg/m2 40.27 kg/m2

## 2022-06-22 NOTE — Assessment & Plan Note (Addendum)
Ms. Marrocco is reminded of the importance of commitment to daily physical activity for 30 minutes or more, as able and the need to limit carbohydrate intake to 30 to 60 grams per meal to help with blood sugar control.   The need to take medication as prescribed, test blood sugar as directed, and to call between visits if there is a concern that blood sugar is uncontrolled is also discussed.   Ms. Balbi is reminded of the importance of daily foot exam, annual eye examination, and good blood sugar, blood pressure and cholesterol control. Uncontrolled, sttar mounjaro and also request CGM, also increase glipizde dose     Latest Ref Rng & Units 06/10/2022    8:34 AM 03/06/2022    9:08 PM 03/06/2022    9:00 PM 01/04/2022    2:45 PM 10/25/2021    9:19 AM  Diabetic Labs  HbA1c 4.8 - 5.6 % 7.7    7.2    Micro/Creat Ratio 0 - 29 mg/g creat     <2   Chol 100 - 199 mg/dL 179       HDL >39 mg/dL 73       Calc LDL 0 - 99 mg/dL 89       Triglycerides 0 - 149 mg/dL 97       Creatinine 0.57 - 1.00 mg/dL 1.23  1.50  1.48  1.04        06/17/2022    4:16 PM 03/18/2022    4:03 PM 03/08/2022    4:42 PM 03/08/2022    4:07 PM 03/07/2022    1:06 AM 03/07/2022   12:45 AM 03/07/2022   12:00 AM  BP/Weight  Systolic BP 239 532 023 343 568 616 837  Diastolic BP 83 80 78 84 62 56 57  Wt. (Lbs) 251.04 244.12  242     BMI 41.78 kg/m2 40.62 kg/m2  40.27 kg/m2         Latest Ref Rng & Units 03/18/2022    4:00 PM 03/03/2022   12:00 AM  Foot/eye exam completion dates  Eye Exam No Retinopathy  No Retinopathy      Foot Form Completion  Done      This result is from an external source.

## 2022-06-22 NOTE — Telephone Encounter (Signed)
Pt requests continuous glucose monitor system and her ins does cover this , I have message with list of options that I will send to you if needed

## 2022-07-13 ENCOUNTER — Ambulatory Visit (INDEPENDENT_AMBULATORY_CARE_PROVIDER_SITE_OTHER): Payer: BC Managed Care – PPO | Admitting: Podiatry

## 2022-07-13 ENCOUNTER — Encounter: Payer: Self-pay | Admitting: Podiatry

## 2022-07-13 ENCOUNTER — Telehealth: Payer: Self-pay | Admitting: *Deleted

## 2022-07-13 ENCOUNTER — Ambulatory Visit (INDEPENDENT_AMBULATORY_CARE_PROVIDER_SITE_OTHER): Payer: BC Managed Care – PPO

## 2022-07-13 DIAGNOSIS — E0843 Diabetes mellitus due to underlying condition with diabetic autonomic (poly)neuropathy: Secondary | ICD-10-CM

## 2022-07-13 DIAGNOSIS — M2041 Other hammer toe(s) (acquired), right foot: Secondary | ICD-10-CM

## 2022-07-13 DIAGNOSIS — M722 Plantar fascial fibromatosis: Secondary | ICD-10-CM

## 2022-07-13 MED ORDER — TRIAMCINOLONE ACETONIDE 10 MG/ML IJ SUSP
10.0000 mg | Freq: Once | INTRAMUSCULAR | Status: AC
  Administered 2022-07-13: 10 mg

## 2022-07-13 NOTE — Telephone Encounter (Signed)
Patient is calling to request a back to work note w/ no restrictions, please forward to MyChart if approved.

## 2022-07-13 NOTE — Telephone Encounter (Signed)
Go ahead and forward. I don't know how to

## 2022-07-14 ENCOUNTER — Encounter: Payer: Self-pay | Admitting: Podiatry

## 2022-07-14 ENCOUNTER — Encounter: Payer: Self-pay | Admitting: *Deleted

## 2022-07-14 NOTE — Progress Notes (Signed)
Subjective:   Patient ID: Glenda Thompson, female   DOB: 51 y.o.   MRN: [id number]   HPI Patient states she has had an increase in pain in the right plantar fascia has a callus of the right fifth toe and does have diabetes and is interested in possible Ortho products or diabetic shoes   ROS      Objective:  Physical Exam  Neurovascular status found to be unchanged with exquisite tenderness which is just started again in the right plantar heel callus formation right fifth toe with rotated digit noted and moderate discomfort in the foot in general.  Diabetes under reasonably good control currently and she is working on weight     Assessment:  Fasciitis-like symptomatology right with inflammation along with digital deformity and moderate depression of the arch     Plan:  H&P reviewed condition sterile prep and injected the fascia right 3 mg Kenalog 5 mg Xylocaine reviewed x-rays indicating moderate depression of the arch but no other midfoot pathology in the fifth digit right does have mild rotation noted.  Patient will be seen back and I will see the results of the injection treatment and we will consider new orthotics or other treatment plan at that time

## 2022-07-14 NOTE — Telephone Encounter (Signed)
Sent the back to work note to patient thru [facility], called patient to inform.

## 2022-07-14 NOTE — Telephone Encounter (Signed)
Sent thru Mychart,patient notified

## 2022-07-18 ENCOUNTER — Ambulatory Visit: Payer: Self-pay | Admitting: *Deleted

## 2022-07-18 NOTE — Patient Outreach (Signed)
  Care Coordination   Follow Up Visit Note   07/18/2022 Name: Glenda Thompson MRN: [id number] DOB: 29-Dec-1970  Glenda Thompson is a 51 y.o. year old female who sees Moshe Cipro, [place] Levo, MD for primary care. I spoke with  [name] by phone today.  What matters to the patients health and wellness today?  Follow up on the continuous glucose meter & home care for diabetes    She received the freestyle libre 2 and her cbg values have been within normal limits ---97 this am & 97 after lunch  She has lost 16 lbs She went from 251-235 lbs with walking every other day and taking prescribed medicine  BMI is now 39.1   For Meal planning she is using her app 1234  She reports she fees a lot better, more energy, no further pain of her feet  She report she received a cortisone shot on 07/14/22 from hr podiatrist   Goals Addressed               This Visit's Progress     Patient Stated     manage diabetes [facility]) (pt-stated)   On track     Care Coordination Interventions: Discussed plans with patient for ongoing care management follow up and provided patient with direct contact information for care management team Reviewed scheduled/upcoming provider appointments including: 08/11/22 4 pm primary care provider visit Review of patient status, including review of consultants reports, relevant laboratory and other test results, and medications completed Screening for signs and symptoms of depression related to chronic disease state  Assessed social determinant of health barriers Confirmed she has received and is using her freestyle libre 2 with decrease in cbg values Discussed her change in BMI to 39.1 with her Encouragement provided        SDOH assessments and interventions completed:  Yes  SDOH Interventions Today    Flowsheet Row Most Recent Value  SDOH Interventions   Food Insecurity Interventions Intervention Not Indicated  Utilities Interventions Intervention Not Indicated   Physical Activity Interventions Intervention Not Indicated  Social Connections Interventions Intervention Not Indicated        Care Coordination Interventions Activated:  Yes  Care Coordination Interventions:  Yes, provided   Follow up plan: Follow up call scheduled for 08/17/22    Encounter Outcome:  Pt. Visit Completed   Abdulhadi Stopa L. Lavina Hamman, RN, BSN, [place] Coordinator Office number [phone]

## 2022-07-18 NOTE — Patient Instructions (Addendum)
Visit Information  Thank you for taking time to visit with me today. Please don't hesitate to contact me if I can be of assistance to you.   Following are the goals we discussed today:   Goals Addressed               This Visit's Progress     Patient Stated     manage diabetes [facility]) (pt-stated)   On track     Care Coordination Interventions: Discussed plans with patient for ongoing care management follow up and provided patient with direct contact information for care management team Reviewed scheduled/upcoming provider appointments including: 08/11/22 4 pm primary care provider visit Review of patient status, including review of consultants reports, relevant laboratory and other test results, and medications completed Screening for signs and symptoms of depression related to chronic disease state  Assessed social determinant of health barriers Confirmed she has received and is using her freestyle libre 2 with decrease in cbg values Discussed her change in BMI to 39.1 with her Encouragement provided        Our next appointment is by telephone on 08/17/22 at 4:30 pm  Please call the care guide team at [phone] if you need to cancel or reschedule your appointment.   If you are experiencing a Mental Health or [place] or need someone to talk to, please call the Suicide and Crisis Lifeline: 988 call the [place] National Suicide Prevention Lifeline: 762-456-4850 or TTY: 706-496-0617 TTY 909 217 7514) to talk to a trained counselor call 1-800-273-TALK (toll free, 24 hour hotline) call the [facility]: [phone] call 911   Patient verbalizes understanding of instructions and care plan provided today and agrees to view in [place]. Active MyChart status and patient understanding of how to access instructions and care plan via MyChart confirmed with patient.     The patient has been provided with contact information for the care management team  and has been advised to call with any health related questions or concerns.   [place] Lavina Hamman, RN, BSN, [place] Coordinator Office number [phone]

## 2022-07-25 ENCOUNTER — Encounter: Payer: Self-pay | Admitting: Family Medicine

## 2022-08-03 ENCOUNTER — Ambulatory Visit: Payer: BC Managed Care – PPO | Admitting: Podiatry

## 2022-08-11 ENCOUNTER — Encounter: Payer: Self-pay | Admitting: Family Medicine

## 2022-08-11 ENCOUNTER — Ambulatory Visit: Payer: BC Managed Care – PPO | Admitting: Family Medicine

## 2022-08-11 VITALS — BP 130/80 | HR 79 | Ht 65.0 in | Wt 232.0 lb

## 2022-08-11 DIAGNOSIS — E559 Vitamin D deficiency, unspecified: Secondary | ICD-10-CM

## 2022-08-11 DIAGNOSIS — I10 Essential (primary) hypertension: Secondary | ICD-10-CM | POA: Diagnosis not present

## 2022-08-11 DIAGNOSIS — E782 Mixed hyperlipidemia: Secondary | ICD-10-CM | POA: Diagnosis not present

## 2022-08-11 DIAGNOSIS — E1159 Type 2 diabetes mellitus with other circulatory complications: Secondary | ICD-10-CM | POA: Diagnosis not present

## 2022-08-11 MED ORDER — METFORMIN HCL ER 500 MG PO TB24: 500.0000 mg | ORAL_TABLET | Freq: Every day | ORAL | 1 refills | Status: DC

## 2022-08-11 NOTE — Patient Instructions (Addendum)
F/U in office early March , call if you need me befoe  'Reduce metformin to oNE daily, stay on mounjaro 5 mg weekly  Congrats on much improved blood sugar  It is important that you exercise regularly at least 30 minutes 5 times a week. If you develop chest pain, have severe difficulty breathing, or feel very tired, stop exercising immediately and seek medical attention   Non fasting HBA1C, chem 7 and eGFR NOv 19 or shortly after  Fasting lipid, cmp and EGFR and hBA1C  and vit D 3 to 5 days before March appointment  Thanks for choosing [facility], we consider it a privelige to serve you.

## 2022-08-14 ENCOUNTER — Encounter: Payer: Self-pay | Admitting: Family Medicine

## 2022-08-14 NOTE — Progress Notes (Signed)
Glenda Thompson     MRN: [id number]      DOB: 1971-09-26   HPI [name] is here for follow up and re-evaluation of chronic medical conditions, medication management and review of any available recent lab and radiology data.  Preventive health is updated, specifically  Cancer screening and Immunization.   Questions or concerns regarding consultations or procedures which the PT has had in the interim are  addressed. Has had low blood sugar on current med dose, significant loss of appetite with weight loss on mounjaro   ROS Denies recent fever or chills. Denies sinus pressure, nasal congestion, ear pain or sore throat. Denies chest congestion, productive cough or wheezing. Denies chest pains, palpitations and leg swelling Denies abdominal pain, nausea, vomiting,diarrhea or constipation.   Denies dysuria, frequency, hesitancy or incontinence. Will be returning to work in the next week Denies headaches, seizures, numbness, or tingling. Denies depression, anxiety or insomnia. Denies skin break down or rash.   PE  BP 130/80   Pulse 79   Ht 5' 5"  (1.651 m)   Wt 232 lb 0.6 oz (105.3 kg)   LMP 11/02/2017   SpO2 94%   BMI 38.61 kg/m   Patient alert and oriented and in no cardiopulmonary distress.  HEENT: No facial asymmetry, EOMI,     Neck supple .  Chest: Clear to auscultation bilaterally.  CVS: S1, S2 no murmurs, no S3.Regular rate.  ABD: Soft non tender.   Ext: No edema  MS: Adequate ROM spine, shoulders, hips and knees.  Skin: Intact, no ulcerations or rash noted.  Psych: Good eye contact, normal affect. Memory intact not anxious or depressed appearing.  CNS: CN 2-12 intact, power,  normal throughout.no focal deficits noted.   Assessment & Plan  Type 2 diabetes mellitus with vascular disease ([place]) Improved, readingsreviewed in office, metformin dose reduced Updated lab needed at/ before next visit. Glenda Thompson is reminded of the importance of commitment to  daily physical activity for 30 minutes or more, as able and the need to limit carbohydrate intake to 30 to 60 grams per meal to help with blood sugar control.   The need to take medication as prescribed, test blood sugar as directed, and to call between visits if there is a concern that blood sugar is uncontrolled is also discussed.   Glenda Thompson is reminded of the importance of daily foot exam, annual eye examination, and good blood sugar, blood pressure and cholesterol control.     Latest Ref Rng & Units 06/10/2022    8:34 AM 03/06/2022    9:08 PM 03/06/2022    9:00 PM 01/04/2022    2:45 PM 10/25/2021    9:19 AM  Diabetic Labs  HbA1c 4.8 - 5.6 % 7.7    7.2    Micro/Creat Ratio 0 - 29 mg/g creat     <2   Chol 100 - 199 mg/dL 179       HDL >39 mg/dL 73       Calc LDL 0 - 99 mg/dL 89       Triglycerides 0 - 149 mg/dL 97       Creatinine 0.57 - 1.00 mg/dL 1.23  1.50  1.48  1.04        08/11/2022    4:33 PM 08/11/2022    4:05 PM 07/18/2022    4:48 PM 06/17/2022    4:16 PM 03/18/2022    4:03 PM 03/08/2022    4:42 PM 03/08/2022  4:07 PM  BP/Weight  Systolic BP 462 703  500 938 182 993  Diastolic BP 80 75  83 80 78 84  Wt. (Lbs)  232.04 235 251.04 244.12  242  BMI  38.61 kg/m2 39.11 kg/m2 41.78 kg/m2 40.62 kg/m2  40.27 kg/m2      Latest Ref Rng & Units 03/18/2022    4:00 PM 03/03/2022   12:00 AM  Foot/eye exam completion dates  Eye Exam No Retinopathy  No Retinopathy      Foot Form Completion  Done      This result is from an external source.        Essential hypertension, benign Controlled, no change in medication DASH diet and commitment to daily physical activity for a minimum of 30 minutes discussed and encouraged, as a part of hypertension management. The importance of attaining a healthy weight is also discussed.     08/11/2022    4:33 PM 08/11/2022    4:05 PM 07/18/2022    4:48 PM 06/17/2022    4:16 PM 03/18/2022    4:03 PM 03/08/2022    4:42 PM 03/08/2022    4:07  PM  BP/Weight  Systolic BP 716 967  893 810 175 102  Diastolic BP 80 75  83 80 78 84  Wt. (Lbs)  232.04 235 251.04 244.12  242  BMI  38.61 kg/m2 39.11 kg/m2 41.78 kg/m2 40.62 kg/m2  40.27 kg/m2       Morbid obesity Improving , which is great  Patient re-educated about  the importance of commitment to a  minimum of 150 minutes of exercise per week as able.  The importance of healthy food choices with portion control discussed, as well as eating regularly and within a 12 hour window most days. The need to choose "clean , green" food 50 to 75% of the time is discussed, as well as to make water the primary drink and set a goal of 64 ounces water daily.       08/11/2022    4:05 PM 07/18/2022    4:48 PM 06/17/2022    4:16 PM  Weight /BMI  Weight 232 lb 0.6 oz 235 lb 251 lb 0.6 oz  Height 5' 5"  (1.651 m) 5' 5"  (1.651 m) 5' 5"  (1.651 m)  BMI 38.61 kg/m2 39.11 kg/m2 41.78 kg/m2      Mixed hyperlipidemia Hyperlipidemia:Low fat diet discussed and encouraged.   Lipid Panel  Lab Results  Component Value Date   CHOL 179 06/10/2022   HDL 73 06/10/2022   LDLCALC 89 06/10/2022   TRIG 97 06/10/2022   CHOLHDL 2.5 06/10/2022     Controlled, no change in medication Updated lab needed at/ before next visit.

## 2022-08-14 NOTE — Assessment & Plan Note (Signed)
Improving , which is great  Patient re-educated about  the importance of commitment to a  minimum of 150 minutes of exercise per week as able.  The importance of healthy food choices with portion control discussed, as well as eating regularly and within a 12 hour window most days. The need to choose "clean , green" food 50 to 75% of the time is discussed, as well as to make water the primary drink and set a goal of 64 ounces water daily.       08/11/2022    4:05 PM 07/18/2022    4:48 PM 06/17/2022    4:16 PM  Weight /BMI  Weight 232 lb 0.6 oz 235 lb 251 lb 0.6 oz  Height 5' 5"  (1.651 m) 5' 5"  (1.651 m) 5' 5"  (1.651 m)  BMI 38.61 kg/m2 39.11 kg/m2 41.78 kg/m2

## 2022-08-14 NOTE — Assessment & Plan Note (Signed)
Improved, readingsreviewed in office, metformin dose reduced Updated lab needed at/ before next visit. Glenda Thompson is reminded of the importance of commitment to daily physical activity for 30 minutes or more, as able and the need to limit carbohydrate intake to 30 to 60 grams per meal to help with blood sugar control.   The need to take medication as prescribed, test blood sugar as directed, and to call between visits if there is a concern that blood sugar is uncontrolled is also discussed.   Glenda Thompson is reminded of the importance of daily foot exam, annual eye examination, and good blood sugar, blood pressure and cholesterol control.     Latest Ref Rng & Units 06/10/2022    8:34 AM 03/06/2022    9:08 PM 03/06/2022    9:00 PM 01/04/2022    2:45 PM 10/25/2021    9:19 AM  Diabetic Labs  HbA1c 4.8 - 5.6 % 7.7    7.2    Micro/Creat Ratio 0 - 29 mg/g creat     <2   Chol 100 - 199 mg/dL 179       HDL >39 mg/dL 73       Calc LDL 0 - 99 mg/dL 89       Triglycerides 0 - 149 mg/dL 97       Creatinine 0.57 - 1.00 mg/dL 1.23  1.50  1.48  1.04        08/11/2022    4:33 PM 08/11/2022    4:05 PM 07/18/2022    4:48 PM 06/17/2022    4:16 PM 03/18/2022    4:03 PM 03/08/2022    4:42 PM 03/08/2022    4:07 PM  BP/Weight  Systolic BP 532 023  343 568 616 837  Diastolic BP 80 75  83 80 78 84  Wt. (Lbs)  232.04 235 251.04 244.12  242  BMI  38.61 kg/m2 39.11 kg/m2 41.78 kg/m2 40.62 kg/m2  40.27 kg/m2      Latest Ref Rng & Units 03/18/2022    4:00 PM 03/03/2022   12:00 AM  Foot/eye exam completion dates  Eye Exam No Retinopathy  No Retinopathy      Foot Form Completion  Done      This result is from an external source.

## 2022-08-14 NOTE — Assessment & Plan Note (Signed)
Hyperlipidemia:Low fat diet discussed and encouraged.   Lipid Panel  Lab Results  Component Value Date   CHOL 179 06/10/2022   HDL 73 06/10/2022   LDLCALC 89 06/10/2022   TRIG 97 06/10/2022   CHOLHDL 2.5 06/10/2022     Controlled, no change in medication Updated lab needed at/ before next visit.

## 2022-08-14 NOTE — Assessment & Plan Note (Signed)
Controlled, no change in medication DASH diet and commitment to daily physical activity for a minimum of 30 minutes discussed and encouraged, as a part of hypertension management. The importance of attaining a healthy weight is also discussed.     08/11/2022    4:33 PM 08/11/2022    4:05 PM 07/18/2022    4:48 PM 06/17/2022    4:16 PM 03/18/2022    4:03 PM 03/08/2022    4:42 PM 03/08/2022    4:07 PM  BP/Weight  Systolic BP 890 228  406 986 148 307  Diastolic BP 80 75  83 80 78 84  Wt. (Lbs)  232.04 235 251.04 244.12  242  BMI  38.61 kg/m2 39.11 kg/m2 41.78 kg/m2 40.62 kg/m2  40.27 kg/m2

## 2022-08-15 ENCOUNTER — Ambulatory Visit (INDEPENDENT_AMBULATORY_CARE_PROVIDER_SITE_OTHER): Payer: BC Managed Care – PPO | Admitting: Podiatry

## 2022-08-15 ENCOUNTER — Encounter: Payer: Self-pay | Admitting: Podiatry

## 2022-08-15 DIAGNOSIS — M722 Plantar fascial fibromatosis: Secondary | ICD-10-CM

## 2022-08-17 ENCOUNTER — Ambulatory Visit: Payer: Self-pay | Admitting: *Deleted

## 2022-08-17 NOTE — Patient Instructions (Addendum)
Visit Information  Thank you for taking time to visit with me today. Please don't hesitate to contact me if I can be of assistance to you.   Following are the goals we discussed today:   Goals Addressed               This Visit's Progress     Patient Stated     manage diabetes [facility]) (pt-stated)   On track     Care Coordination Interventions: Discussed plans with patient for ongoing care management follow up and provided patient with direct contact information for care management team Reviewed scheduled/upcoming provider appointments including: 08/11/22 4 pm primary care provider visit Review of patient status, including review of consultants reports, relevant laboratory and other test results, and medications completed Screening for signs and symptoms of depression related to chronic disease state  Assessed social determinant of health barriers Confirmed a decrease in weight to 232 lbs - Loss of 19 lbs in last 2 months Discussed her inquiry about her freestyle libre 2 monthly cost of ~$75 for supplies  Confirmed the next HgA1c scheduled in November 2023  Encouragement provided        Our next appointment is by telephone on 09/20/22 at 4:30 pm   Please call the care guide team at [phone] if you need to cancel or reschedule your appointment.   If you are experiencing a Mental Health or [place] or need someone to talk to, please call the Suicide and Crisis Lifeline: 988 call the [place] National Suicide Prevention Lifeline: 843-618-6356 or TTY: (906) 160-8251 TTY 306-416-1990) to talk to a trained counselor call 1-800-273-TALK (toll free, 24 hour hotline) call the [facility]: 607-301-4153 call 911   Patient verbalizes understanding of instructions and care plan provided today and agrees to view in [place]. Active MyChart status and patient understanding of how to access instructions and care plan via MyChart confirmed with patient.      The patient has been provided with contact information for the care management team and has been advised to call with any health related questions or concerns.   Jammie Troup L. Lavina Hamman, RN, BSN, [place] Coordinator Office number [phone]

## 2022-08-17 NOTE — Progress Notes (Signed)
Subjective:   Patient ID: Glenda Thompson, female   DOB: 51 y.o.   MRN: [id number]   HPI Patient states she is feeling much better with minimal discomfort now in her plantar heel after treatment   ROS      Objective:  Physical Exam  Neurovascular status intact negative Bevelyn Buckles' sign noted patient is found to have significant reduction of pain plantar heel just mild" Palpation     Assessment:  Doing much better after being treated for plantar fascial inflammation right     Plan:  Reviewed condition recommended the continuation of supportive therapy and good shoe gear and patient will be seen back to recheck encouraged to call with questions concerns which may arise

## 2022-08-17 NOTE — Patient Outreach (Signed)
  Care Coordination   Follow Up Visit Note   08/17/2022 Name: Glenda Thompson MRN: [id number] DOB: [birth year]  Glenda Thompson is a 51 y.o. year old female who sees Moshe Cipro, [place] Levo, MD for primary care. I spoke with  [name] by phone today.  What matters to the patients health and wellness today?  Patient is doing well She is on vacation.  She voiced concern with the cost of her Free style [employer] are averaging $75 per month  To re test for her hgA1c in November 2023 Last one on 06/10/22 was 7.7 Loss of 19 lbs in last 2 months   Goals Addressed               This Visit's Progress     Patient Stated     manage diabetes [facility]) (pt-stated)   On track     Care Coordination Interventions: Discussed plans with patient for ongoing care management follow up and provided patient with direct contact information for care management team Reviewed scheduled/upcoming provider appointments including: 08/11/22 4 pm primary care provider visit Review of patient status, including review of consultants reports, relevant laboratory and other test results, and medications completed Screening for signs and symptoms of depression related to chronic disease state  Assessed social determinant of health barriers Confirmed a decrease in weight to 232 lbs - Loss of 19 lbs in last 2 months Discussed her inquiry about her freestyle libre 2 monthly cost of ~$75 for supplies  Confirmed the next HgA1c scheduled in November 2023  Encouragement provided        SDOH assessments and interventions completed:  No     Care Coordination Interventions Activated:  Yes  Care Coordination Interventions:  Yes, provided   Follow up plan: Follow up call scheduled for 09/20/22    Encounter Outcome:  Pt. Visit Completed   Ahaan Zobrist L. Lavina Hamman, RN, BSN, [place] Coordinator Office number [phone]

## 2022-08-19 ENCOUNTER — Other Ambulatory Visit: Payer: Self-pay | Admitting: Family Medicine

## 2022-09-08 ENCOUNTER — Other Ambulatory Visit: Payer: Self-pay | Admitting: Family Medicine

## 2022-09-13 ENCOUNTER — Other Ambulatory Visit: Payer: Self-pay | Admitting: Family Medicine

## 2022-09-20 ENCOUNTER — Ambulatory Visit: Payer: Self-pay | Admitting: *Deleted

## 2022-09-20 NOTE — Patient Outreach (Signed)
  Care Coordination   Follow Up Visit Note   09/20/2022 Name: Glenda Thompson MRN: [id number] DOB: [birth year]  Glenda Thompson is a 51 y.o. year old female who sees Moshe Cipro, [place] Levo, MD for primary care. I spoke with  [name] by phone today.  What matters to the patients health and wellness today?  Weight 222 lbs now  Cost of continuous blood glucose too expensive so will continue to use her regular glucose meter She is scheduled and anticipates improved labs this week with possibly a reduction in diabetes and hypertension medicines   Goals Addressed               This Visit's Progress     Patient Stated     manage diabetes [facility]) (pt-stated)   On track     Care Coordination Interventions: Discussed plans with patient for ongoing care management follow up and provided patient with direct contact information for care management team Reviewed scheduled/upcoming provider appointments including: 111/29/23 4 pm primary care provider visit Confirmed a decrease in weight to 222 lbs - Loss of 29 lbs in last 3-4 months Encouragement provided & commended her for her hard work        SDOH assessments and interventions completed:  Yes     Care Coordination Interventions:  Yes, provided   Follow up plan: Follow up call scheduled for 11/08/22    Encounter Outcome:  Pt. Visit Completed   [place] Yeats L. Lavina Hamman, RN, BSN, [place] Coordinator Office number [phone]

## 2022-09-20 NOTE — Patient Instructions (Signed)
Visit Information  Thank you for taking time to visit with me today. Please don't hesitate to contact me if I can be of assistance to you.   Following are the goals we discussed today:   Goals Addressed               This Visit's Progress     Patient Stated     manage diabetes [facility]) (pt-stated)   On track     Care Coordination Interventions: Discussed plans with patient for ongoing care management follow up and provided patient with direct contact information for care management team Reviewed scheduled/upcoming provider appointments including: 111/29/23 4 pm primary care provider visit Confirmed a decrease in weight to 222 lbs - Loss of 29 lbs in last 3-4 months Encouragement provided & commended her for her hard work        [place] next appointment is by telephone on 11/08/22 at 4:30 pm  Please call the care guide team at [phone] if you need to cancel or reschedule your appointment.   If you are experiencing a Mental Health or [place] or need someone to talk to, please call the Suicide and Crisis Lifeline: 988 call the [place] National Suicide Prevention Lifeline: 443-111-6344 or TTY: (225)864-2498 TTY 514-230-9558) to talk to a trained counselor call 1-800-273-TALK (toll free, 24 hour hotline) call the [facility]: [phone] call 911   Patient verbalizes understanding of instructions and care plan provided today and agrees to view in [place]. Active MyChart status and patient understanding of how to access instructions and care plan via MyChart confirmed with patient.     The patient has been provided with contact information for the care management team and has been advised to call with any health related questions or concerns.   Glada Wickstrom L. Lavina Hamman, RN, BSN, [place] Coordinator Office number [phone]

## 2022-09-21 ENCOUNTER — Encounter: Payer: Self-pay | Admitting: Family Medicine

## 2022-09-21 DIAGNOSIS — E782 Mixed hyperlipidemia: Secondary | ICD-10-CM | POA: Diagnosis not present

## 2022-09-21 DIAGNOSIS — I10 Essential (primary) hypertension: Secondary | ICD-10-CM | POA: Diagnosis not present

## 2022-09-21 DIAGNOSIS — E559 Vitamin D deficiency, unspecified: Secondary | ICD-10-CM | POA: Diagnosis not present

## 2022-09-21 DIAGNOSIS — E1159 Type 2 diabetes mellitus with other circulatory complications: Secondary | ICD-10-CM | POA: Diagnosis not present

## 2022-09-22 LAB — CMP14+EGFR
ALT: 11 IU/L (ref 0–32)
AST: 16 IU/L (ref 0–40)
Albumin/Globulin Ratio: 2 (ref 1.2–2.2)
Albumin: 4.6 g/dL (ref 3.8–4.9)
Alkaline Phosphatase: 56 IU/L (ref 44–121)
BUN/Creatinine Ratio: 22 (ref 9–23)
BUN: 34 mg/dL — ABNORMAL HIGH (ref 6–24)
Bilirubin Total: 0.3 mg/dL (ref 0.0–1.2)
CO2: 20 mmol/L (ref 20–29)
Calcium: 10.1 mg/dL (ref 8.7–10.2)
Chloride: 104 mmol/L (ref 96–106)
Creatinine, Ser: 1.52 mg/dL — ABNORMAL HIGH (ref 0.57–1.00)
Globulin, Total: 2.3 g/dL (ref 1.5–4.5)
Glucose: 101 mg/dL — ABNORMAL HIGH (ref 70–99)
Potassium: 5.3 mmol/L — ABNORMAL HIGH (ref 3.5–5.2)
Sodium: 139 mmol/L (ref 134–144)
Total Protein: 6.9 g/dL (ref 6.0–8.5)
eGFR: 41 mL/min/{1.73_m2} — ABNORMAL LOW (ref >59)

## 2022-09-22 LAB — LIPID PANEL
Chol/HDL Ratio: 2.5 ratio (ref 0.0–4.4)
Cholesterol, Total: 134 mg/dL (ref 100–199)
HDL: 53 mg/dL (ref >39)
LDL Chol Calc (NIH): 66 mg/dL (ref 0–99)
Triglycerides: 77 mg/dL (ref 0–149)
VLDL Cholesterol Cal: 15 mg/dL (ref 5–40)

## 2022-09-22 LAB — HEMOGLOBIN A1C
Est. average glucose Bld gHb Est-mCnc: 134 mg/dL
Hgb A1c MFr Bld: 6.3 % — ABNORMAL HIGH (ref 4.8–5.6)

## 2022-09-22 LAB — VITAMIN D 25 HYDROXY (VIT D DEFICIENCY, FRACTURES): Vit D, 25-Hydroxy: 44.6 ng/mL (ref 30.0–100.0)

## 2022-09-23 ENCOUNTER — Other Ambulatory Visit: Payer: Self-pay

## 2022-09-23 ENCOUNTER — Other Ambulatory Visit: Payer: Self-pay | Admitting: Family Medicine

## 2022-09-23 DIAGNOSIS — E1159 Type 2 diabetes mellitus with other circulatory complications: Secondary | ICD-10-CM

## 2022-10-23 ENCOUNTER — Other Ambulatory Visit: Payer: Self-pay | Admitting: Family Medicine

## 2022-10-28 ENCOUNTER — Other Ambulatory Visit: Payer: Self-pay

## 2022-10-28 ENCOUNTER — Emergency Department (HOSPITAL_COMMUNITY)
Admission: EM | Admit: 2022-10-28 | Discharge: 2022-10-29 | Disposition: A | Discharge status: Home / Self Care | Payer: BC Managed Care – PPO | Attending: Emergency Medicine | Admitting: Emergency Medicine

## 2022-10-28 ENCOUNTER — Encounter (HOSPITAL_COMMUNITY): Payer: Self-pay | Admitting: Emergency Medicine

## 2022-10-28 DIAGNOSIS — Z794 Long term (current) use of insulin: Secondary | ICD-10-CM | POA: Diagnosis not present

## 2022-10-28 DIAGNOSIS — E119 Type 2 diabetes mellitus without complications: Secondary | ICD-10-CM | POA: Insufficient documentation

## 2022-10-28 DIAGNOSIS — R001 Bradycardia, unspecified: Secondary | ICD-10-CM | POA: Diagnosis not present

## 2022-10-28 DIAGNOSIS — Z79899 Other long term (current) drug therapy: Secondary | ICD-10-CM | POA: Diagnosis not present

## 2022-10-28 DIAGNOSIS — Z7984 Long term (current) use of oral hypoglycemic drugs: Secondary | ICD-10-CM | POA: Insufficient documentation

## 2022-10-28 DIAGNOSIS — I10 Essential (primary) hypertension: Secondary | ICD-10-CM | POA: Diagnosis not present

## 2022-10-28 DIAGNOSIS — R110 Nausea: Secondary | ICD-10-CM | POA: Diagnosis not present

## 2022-10-28 DIAGNOSIS — K2900 Acute gastritis without bleeding: Secondary | ICD-10-CM | POA: Diagnosis not present

## 2022-10-28 DIAGNOSIS — I959 Hypotension, unspecified: Secondary | ICD-10-CM | POA: Diagnosis not present

## 2022-10-28 DIAGNOSIS — R109 Unspecified abdominal pain: Secondary | ICD-10-CM | POA: Diagnosis not present

## 2022-10-28 LAB — CBC WITH DIFFERENTIAL/PLATELET
Abs Immature Granulocytes: 0.01 10*3/uL (ref 0.00–0.07)
Basophils Absolute: 0 10*3/uL (ref 0.0–0.1)
Basophils Relative: 0 %
Eosinophils Absolute: 0.1 10*3/uL (ref 0.0–0.5)
Eosinophils Relative: 1 %
HCT: 35.3 % — ABNORMAL LOW (ref 36.0–46.0)
Hemoglobin: 11.2 g/dL — ABNORMAL LOW (ref 12.0–15.0)
Immature Granulocytes: 0 %
Lymphocytes Relative: 16 %
Lymphs Abs: 1.5 10*3/uL (ref 0.7–4.0)
MCH: 28.2 pg (ref 26.0–34.0)
MCHC: 31.7 g/dL (ref 30.0–36.0)
MCV: 88.9 fL (ref 80.0–100.0)
Monocytes Absolute: 0.5 10*3/uL (ref 0.1–1.0)
Monocytes Relative: 5 %
Neutro Abs: 7.3 10*3/uL (ref 1.7–7.7)
Neutrophils Relative %: 78 %
Platelets: 289 10*3/uL (ref 150–400)
RBC: 3.97 MIL/uL (ref 3.87–5.11)
RDW: 13.1 % (ref 11.5–15.5)
WBC: 9.3 10*3/uL (ref 4.0–10.5)
nRBC: 0 % (ref 0.0–0.2)

## 2022-10-28 LAB — COMPREHENSIVE METABOLIC PANEL
ALT: 17 U/L (ref 0–44)
AST: 20 U/L (ref 15–41)
Albumin: 4.1 g/dL (ref 3.5–5.0)
Alkaline Phosphatase: 56 U/L (ref 38–126)
Anion gap: 10 (ref 5–15)
BUN: 34 mg/dL — ABNORMAL HIGH (ref 6–20)
CO2: 23 mmol/L (ref 22–32)
Calcium: 9.1 mg/dL (ref 8.9–10.3)
Chloride: 101 mmol/L (ref 98–111)
Creatinine, Ser: 1.53 mg/dL — ABNORMAL HIGH (ref 0.44–1.00)
GFR, Estimated: 41 mL/min — ABNORMAL LOW (ref >60)
Glucose, Bld: 180 mg/dL — ABNORMAL HIGH (ref 70–99)
Potassium: 4.3 mmol/L (ref 3.5–5.1)
Sodium: 134 mmol/L — ABNORMAL LOW (ref 135–145)
Total Bilirubin: 0.5 mg/dL (ref 0.3–1.2)
Total Protein: 7.4 g/dL (ref 6.5–8.1)

## 2022-10-28 MED ORDER — ONDANSETRON 4 MG PO TBDP
4.0000 mg | ORAL_TABLET | Freq: Once | ORAL | Status: AC
  Administered 2022-10-28: 4 mg via ORAL
  Filled 2022-10-28: qty 1

## 2022-10-28 NOTE — ED Triage Notes (Signed)
Pt reports nausea, diarrhea and abd cramps since eating at [place], reports feels same as when she previously had food poisoning

## 2022-10-29 MED ORDER — DICYCLOMINE HCL 20 MG PO TABS: 20.0000 mg | ORAL_TABLET | Freq: Two times a day (BID) | ORAL | 0 refills | Status: DC

## 2022-10-29 MED ORDER — ONDANSETRON 4 MG PO TBDP: 4.0000 mg | ORAL_TABLET | Freq: Three times a day (TID) | ORAL | 0 refills | Status: DC | PRN

## 2022-10-29 NOTE — ED Provider Notes (Signed)
[facility] EMERGENCY DEPARTMENT Provider Note   CSN: [id number] Arrival date & time: 10/28/22  2016     History  Chief Complaint  Patient presents with   Nausea    Glenda Thompson is a 52 y.o. female.  HPI     This is a 52 year old female who presents with nausea and abdominal cramping.  Patient states that she felt fine today while at work.  She went and got [employer] after work.  After eating [employer] she developed crampy abdominal pain and nausea.  She also states that she has had multiple loose stools.  Patient states that this has happened once before when she was diagnosed with food poisoning.  Has not had any recent fevers or illnesses.  Denies urinary symptoms.  Home Medications Prior to Admission medications   Medication Sig Start Date End Date Taking? Authorizing Provider  dicyclomine (BENTYL) 20 MG tablet Take 1 tablet (20 mg total) by mouth 2 (two) times daily. 10/29/22  Yes Adali Pennings, Mayer Masker, MD  ondansetron (ZOFRAN-ODT) 4 MG disintegrating tablet Take 1 tablet (4 mg total) by mouth every 8 (eight) hours as needed for nausea or vomiting. 10/29/22  Yes Danijah Noh, Mayer Masker, MD  amLODipine (NORVASC) 10 MG tablet TAKE 1 TABLET(10 MG) BY MOUTH DAILY 09/09/22   Kerri Perches, MD  blood glucose meter kit and supplies Three times daily dx E11.65 Freestyle Lite Patient not taking: Reported on 08/11/2022 01/28/20   Kerri Perches, MD  Continuous Blood Gluc Receiver (FREESTYLE LIBRE 2 READER) DEVI To check glucose dx e11.9 06/22/22   Kerri Perches, MD  Continuous Blood Gluc Sensor (FREESTYLE LIBRE 2 SENSOR) MISC To check glucose dx e11.9 06/22/22   Kerri Perches, MD  ergocalciferol (VITAMIN D2) 1.25 MG (50000 UT) capsule Take 1 capsule (50,000 Units total) by mouth once a week. One capsule once weekly 06/17/22   Kerri Perches, MD  fluticasone [facility]) 50 MCG/ACT nasal spray SPRAY 2 SPRAYS INTO EACH NOSTRIL EVERY DAY Patient taking differently: 1 spray  daily as needed for allergies. 08/16/21   Kerri Perches, MD  glipiZIDE (GLUCOTROL XL) 5 MG 24 hr tablet TAKE 1 TABLET BY MOUTH EVERY DAY WITH BREAKFAST 09/13/22   Kerri Perches, MD  Insulin Pen Needle (B-D ULTRAFINE III SHORT PEN) 31G X 8 MM MISC Use as directed to inject insulin daily. 06/28/18   Kerri Perches, MD  lisinopril-hydrochlorothiazide (ZESTORETIC) 20-12.5 MG tablet TAKE 2 TABLETS BY MOUTH DAILY 09/09/22   Kerri Perches, MD  Multiple Vitamin (MULTIVITAMIN WITH MINERALS) TABS tablet Take 1 tablet by mouth daily.    [provider]  rosuvastatin (CRESTOR) 5 MG tablet TAKE 1 TABLET BY MOUTH EVERY DAY 05/12/22   Anabel Halon, MD  spironolactone (ALDACTONE) 100 MG tablet TAKE 1 TABLET BY MOUTH EVERY DAY 06/15/22   Kerri Perches, MD  tirzepatide [facility]) 5 MG/0.5ML Pen INJECT 5 MG SUBCUTANEOUSLY WEEKLY 10/25/22   Kerri Perches, MD      Allergies    Patient has no known allergies.    Review of Systems   Review of Systems  Constitutional:  Negative for fever.  Respiratory:  Negative for shortness of breath.   Cardiovascular:  Negative for chest pain.  Gastrointestinal:  Positive for abdominal pain, diarrhea and nausea.  All other systems reviewed and are negative.   Physical Exam Updated Vital Signs BP (!) 112/54   Pulse 60   Temp 98.1 F (36.7 C) (Oral)  Resp 18   Ht 1.651 m (5\' 5" )   Wt 99.3 kg   LMP 11/02/2017   SpO2 100%   BMI 36.44 kg/m  Physical Exam Vitals and nursing note reviewed.  Constitutional:      Appearance: She is well-developed. She is obese. She is not ill-appearing.  HENT:     Head: Normocephalic and atraumatic.  Eyes:     Pupils: Pupils are equal, round, and reactive to light.  Cardiovascular:     Rate and Rhythm: Normal rate and regular rhythm.     Heart sounds: Normal heart sounds.  Pulmonary:     Effort: Pulmonary effort is normal. No respiratory distress.     Breath sounds: No wheezing.   Abdominal:     General: Bowel sounds are normal.     Palpations: Abdomen is soft.     Tenderness: There is abdominal tenderness. There is no guarding or rebound.     Comments: Epigastric tenderness palpation, no rebound or guarding  Musculoskeletal:     Cervical back: Neck supple.  Skin:    General: Skin is warm and dry.  Neurological:     Mental Status: She is alert and oriented to person, place, and time.  Psychiatric:        Mood and Affect: Mood normal.     ED Results / Procedures / Treatments   Labs (all labs ordered are listed, but only abnormal results are displayed) Labs Reviewed  CBC WITH DIFFERENTIAL/PLATELET - Abnormal; Notable for the following components:      Result Value   Hemoglobin 11.2 (*)    HCT 35.3 (*)    All other components within normal limits  COMPREHENSIVE METABOLIC PANEL - Abnormal; Notable for the following components:   Sodium 134 (*)    Glucose, Bld 180 (*)    BUN 34 (*)    Creatinine, Ser 1.53 (*)    GFR, Estimated 41 (*)    All other components within normal limits    EKG None  Radiology No results found.  Procedures Procedures    Medications Ordered in ED Medications  ondansetron (ZOFRAN-ODT) disintegrating tablet 4 mg (4 mg Oral Given 10/28/22 2337)    ED Course/ Medical Decision Making/ A&P                           Medical Decision Making Risk Prescription drug management.   This patient presents to the ED for concern of abdominal pain, nausea, this involves an extensive number of treatment options, and is a complaint that carries with it a high risk of complications and morbidity.  I considered the following differential and admission for this acute, potentially life threatening condition.  The differential diagnosis includes gastritis, gastroenteritis, pancreatitis, cholecystitis, colitis  MDM:    This is a 52 year old female who presents with fairly acute onset nausea, diarrhea, crampy abdominal pain.  She is nontoxic  and vital signs are reassuring.  She has mild tenderness in the epigastrium without signs of peritonitis.  Labs obtained and reviewed.  No leukocytosis.  No significant metabolic derangements.  LFTs normal.  Given description of pain and physical exam, suspect gastritis versus gastroenteritis.  Patient improved with Zofran.  Will discharge with Zofran and Bentyl.  She is able to tolerate fluids without difficulty.  (Labs, imaging, consults)  Labs: I Ordered, and personally interpreted labs.  The pertinent results include: CBC, CMP  Imaging Studies ordered: I ordered imaging studies including none I independently  visualized and interpreted imaging. I agree with the radiologist interpretation  Additional history obtained from family at bedside.  External records from outside source obtained and reviewed including prior evaluations  Cardiac Monitoring: The patient was maintained on a cardiac monitor.  I personally viewed and interpreted the cardiac monitored which showed an underlying rhythm of: Sinus rhythm  Reevaluation: After the interventions noted above, I reevaluated the patient and found that they have :improved  Social Determinants of Health:  lives independently  Disposition: Discharge  Co morbidities that complicate the patient evaluation  Past Medical History:  Diagnosis Date   Arthritis    Diabetes mellitus    Diabetes mellitus without complication (HCC)    [employer]rees[employer] 01/19/2021   Hyperlipidemia    Hypertension      Medicines Meds ordered this encounter  Medications   ondansetron (ZOFRAN-ODT) disintegrating tablet 4 mg   ondansetron (ZOFRAN-ODT) 4 MG disintegrating tablet    Sig: Take 1 tablet (4 mg total) by mouth every 8 (eight) hours as needed for nausea or vomiting.    Dispense:  20 tablet    Refill:  0   dicyclomine (BENTYL) 20 MG tablet    Sig: Take 1 tablet (20 mg total) by mouth 2 (two) times daily.    Dispense:  20 tablet    Refill:  0    I have  reviewed the patients home medicines and have made adjustments as needed  Problem List / ED Course: Problem List Items Addressed This Visit   None Visit Diagnoses     Acute gastritis without hemorrhage, unspecified gastritis type    -  Primary                   Final Clinical Impression(s) / ED Diagnoses Final diagnoses:  Acute gastritis without hemorrhage, unspecified gastritis type    Rx / DC Orders ED Discharge Orders          Ordered    ondansetron (ZOFRAN-ODT) 4 MG disintegrating tablet  Every 8 hours PRN        10/29/22 0119    dicyclomine (BENTYL) 20 MG tablet  2 times daily        10/29/22 0119              Shon Baton, MD 10/29/22 0136

## 2022-10-29 NOTE — ED Notes (Signed)
Pt denies nausea at this time- pt given ice water per request, doesn't like gingerale, but did drink half cup and tolerated well. "Abdominal pain gone, just sore now"

## 2022-10-29 NOTE — ED Notes (Signed)
Pt given diet ginger ale for PO challenge 

## 2022-10-29 NOTE — Discharge Instructions (Signed)
You were seen today for abdominal discomfort and nausea.  Your workup is reassuring.  Take Zofran and Bentyl as needed.  Advance your diet slowly as tolerated.

## 2022-10-29 NOTE — ED Notes (Signed)
Pt tolerated PO challenge well.

## 2022-11-07 ENCOUNTER — Telehealth: Payer: Self-pay | Admitting: *Deleted

## 2022-11-07 NOTE — Progress Notes (Signed)
  Care Coordination Note  11/07/2022 Name: Glenda Thompson MRN: [id number] DOB: [birth year]  Glenda Thompson is a 52 y.o. year old female who is a primary care patient of Fayrene Helper, MD and is actively engaged with the care management team. I reached out to [name] by phone today to assist with re-scheduling a follow up visit with the RN Case Manager  Follow up plan: Unsuccessful telephone outreach attempt made. A HIPAA compliant phone message was left for the patient providing contact information and requesting a return call.  [place]  Direct Dial: [phone]

## 2022-11-08 ENCOUNTER — Other Ambulatory Visit: Payer: Self-pay

## 2022-11-08 ENCOUNTER — Encounter: Payer: Self-pay | Admitting: *Deleted

## 2022-11-08 ENCOUNTER — Other Ambulatory Visit: Payer: Self-pay | Admitting: Internal Medicine

## 2022-11-08 DIAGNOSIS — E782 Mixed hyperlipidemia: Secondary | ICD-10-CM

## 2022-11-08 MED ORDER — ROSUVASTATIN CALCIUM 5 MG PO TABS: 5.0000 mg | ORAL_TABLET | Freq: Every day | ORAL | 1 refills | Status: DC

## 2022-11-09 NOTE — Progress Notes (Signed)
  Care Coordination Note  11/09/2022 Name: Glenda Thompson MRN: [id number] DOB: 12/17/1970  [name] is a 52 y.o. year old female who is a primary care patient of Fayrene Helper, MD and is actively engaged with the care management team. I reached out to [name] by phone today to assist with re-scheduling a follow up visit with the RN Case Manager  Follow up plan:  A second unsuccessful telephone outreach attempt made. A HIPAA compliant phone message was left for the patient providing contact information and requesting a return call.   [place]  Direct Dial: [phone]

## 2022-11-11 NOTE — Progress Notes (Signed)
  Care Coordination Note  11/11/2022 Name: Glenda Thompson MRN: [id number] DOB: 1971-08-29  Glenda Thompson is a 52 y.o. year old female who is a primary care patient of Fayrene Helper, MD and is actively engaged with the care management team. I reached out to [name] by phone today to assist with re-scheduling a follow up visit with the RN Case Manager  Follow up plan: Telephone appointment with care management team member scheduled for:12/01/22  [place]: [phone]

## 2022-12-01 ENCOUNTER — Ambulatory Visit: Payer: Self-pay | Admitting: *Deleted

## 2022-12-01 NOTE — Patient Outreach (Signed)
  Care Coordination   12/01/2022 Name: Glenda Thompson MRN: [id number] DOB: 1971/02/14   Care Coordination Outreach Attempts:  An unsuccessful telephone outreach was attempted today to offer the patient information about available care coordination services as a benefit of their health plan.   Follow Up Plan:  Additional outreach attempts will be made to offer the patient care coordination information and services.   Encounter Outcome:  No Answer   Care Coordination Interventions:  No, not indicated    Murry Khiev L. Lavina Hamman, RN, BSN, [place] Coordinator Office number [phone]

## 2022-12-12 ENCOUNTER — Ambulatory Visit: Payer: Self-pay | Admitting: *Deleted

## 2022-12-12 NOTE — Patient Outreach (Signed)
  Care Coordination   12/12/2022 Name: Glenda Thompson MRN: [id number] DOB: [birth year]   Care Coordination Outreach Attempts:  A second unsuccessful outreach was attempted today to offer the patient with information about available care coordination services as a benefit of their health plan.     Follow Up Plan:  Additional outreach attempts will be made to offer the patient care coordination information and services.   Encounter Outcome:  No Answer   Care Coordination Interventions:  No, not indicated    Melania Kirks L. Lavina Hamman, RN, BSN, [place] Coordinator Office number [phone]

## 2022-12-12 NOTE — Patient Outreach (Addendum)
  Care Coordination   Follow Up Visit Note   12/12/2022 Name: Glenda Thompson MRN: [id number] DOB: May 12, 1971  Glenda Thompson is a 52 y.o. year old female who sees Moshe Cipro, [place] Levo, MD for primary care. I spoke with  [name] by phone today.  What matters to the patients health and wellness today?  Mounjaro side effects (on 5 mg/0.5 ml) like any bloating, gassiness, constipation, indigestion. Most over the counter (OTC) products recommended do not help per patient (except for lots of Tums) Review of 10/28/22 ED visit for nausea, multiple loose stools, abdominal cramping after eating chick fil A Dx acute gastritis without hemorrhage  Personal history of constipation, gastric ulcer per 2022 colonoscopy + family history of GI concerns, (mother, Aunt)  Requesting gastroenterologist referral  Patient confirms she is not able to tolerate pinto beans, fried foods, fries Her present Weight = 208 lb, was 251 lbs, with a goal weight of 219 lbs  Last Colonoscopy 2 years ago in 2022  History of constipation- does not have a stool every 1- 3 days, more like every 1- 2 weeks   She requests to receive a gastroenterology referral from her pcp     Goals Addressed               This Visit's Progress     Patient Stated     manage diabetes ([facility]) (pt-stated)   On track     Care Coordination Interventions: Discussed plans with patient for ongoing care management follow up and provided patient with direct contact information for care management team Confirmed a decrease in weight to 208 lbs - Loss of 43 lbs in last 6+ months Encouragement provided & commended her for her hard work Interventions Today    Flowsheet Row Most Recent Value  Chronic Disease   Chronic disease during today's visit Diabetes, Other  [side effect of mounjaro, constipation, request for GI consult]  General Interventions   General Interventions Discussed/Reviewed --  [EPIC in basket message to [employer]  clinical, pcp for GI referral]  Doctor Visits Discussed/Reviewed Doctor Visits Reviewed, PCP, Specialist  Health Screening Colonoscopy  PCP/Specialist Visits Compliance with follow-up visit  Communication with PCP/Specialists  Exercise Interventions   Exercise Discussed/Reviewed Weight Managment, Exercise Reviewed  Weight Management Weight maintenance  Education Interventions   Education Provided Provided Web-based Education  Provided Verbal Education On Other  [IBS, Mounjaro, constipation]  Nutrition Interventions   Nutrition Discussed/Reviewed Nutrition Reviewed, Decreasing fats, Decreasing sugar intake  Pharmacy Interventions   Pharmacy Dicussed/Reviewed Pharmacy Topics Discussed, Medications and their functions             SDOH assessments and interventions completed:  No     Care Coordination Interventions:  Yes, provided   Follow up plan: Follow up call scheduled for 01/10/23    Encounter Outcome:  Pt. Visit Completed   [name], RN, BSN, [place] Coordinator Office number [phone]

## 2022-12-12 NOTE — Patient Instructions (Addendum)
Visit Information  Thank you for taking time to visit with me today. Please don't hesitate to contact me if I can be of assistance to you.   Following are the goals we discussed today:   Goals Addressed               This Visit's Progress     Patient Stated     manage diabetes [facility]) (pt-stated)   On track     Care Coordination Interventions: Discussed plans with patient for ongoing care management follow up and provided patient with direct contact information for care management team Confirmed a decrease in weight to 208 lbs - Loss of 43 lbs in last 6+ months Encouragement provided & commended her for her hard work Interventions Today    Flowsheet Row Most Recent Value  Chronic Disease   Chronic disease during today's visit Diabetes, Other  [side effect of mounjaro, constipation, request for GI consult]  General Interventions   General Interventions Discussed/Reviewed --  [EPIC in basket message to [employer] [facility] clinical, pcp for GI referral]  Doctor Visits Discussed/Reviewed Doctor Visits Reviewed, PCP, Specialist  Health Screening Colonoscopy  PCP/Specialist Visits Compliance with follow-up visit  Communication with PCP/Specialists  Exercise Interventions   Exercise Discussed/Reviewed Weight Managment, Exercise Reviewed  Weight Management Weight maintenance  Education Interventions   Education Provided Provided Web-based Education  Provided Verbal Education On Other  [IBS, Mounjaro, constipation]  Nutrition Interventions   Nutrition Discussed/Reviewed Nutrition Reviewed, Decreasing fats, Decreasing sugar intake  Pharmacy Interventions   Pharmacy Dicussed/Reviewed Pharmacy Topics Discussed, Medications and their functions             Our next appointment is by telephone on 01/10/23 at 3:30 pm  Please call the care guide team at [phone] if you need to cancel or reschedule your appointment.   If you are experiencing a Mental Health or [place]  or need someone to talk to, please call the Suicide and Crisis Lifeline: 988 call the [place] National Suicide Prevention Lifeline: (936)338-3747 or TTY: 269-359-7019 TTY (610)031-2740) to talk to a trained counselor call 1-800-273-TALK (toll free, 24 hour hotline) call the [facility]: [phone] call 911   Patient verbalizes understanding of instructions and care plan provided today and agrees to view in [place]. Active MyChart status and patient understanding of how to access instructions and care plan via MyChart confirmed with patient.     The patient has been provided with contact information for the care management team and has been advised to call with any health related questions or concerns.    Treasure Ingrum L. Lavina Hamman, RN, BSN, [place] Coordinator Office number [phone]

## 2022-12-14 ENCOUNTER — Other Ambulatory Visit: Payer: Self-pay | Admitting: Family Medicine

## 2022-12-18 ENCOUNTER — Other Ambulatory Visit: Payer: Self-pay | Admitting: Family Medicine

## 2022-12-19 ENCOUNTER — Encounter: Payer: Self-pay | Admitting: Family Medicine

## 2022-12-20 ENCOUNTER — Encounter: Payer: Self-pay | Admitting: *Deleted

## 2022-12-20 ENCOUNTER — Ambulatory Visit: Payer: Self-pay | Admitting: *Deleted

## 2022-12-20 ENCOUNTER — Other Ambulatory Visit: Payer: Self-pay

## 2022-12-20 DIAGNOSIS — E1159 Type 2 diabetes mellitus with other circulatory complications: Secondary | ICD-10-CM

## 2022-12-20 DIAGNOSIS — I10 Essential (primary) hypertension: Secondary | ICD-10-CM

## 2022-12-20 DIAGNOSIS — E559 Vitamin D deficiency, unspecified: Secondary | ICD-10-CM

## 2022-12-20 DIAGNOSIS — E782 Mixed hyperlipidemia: Secondary | ICD-10-CM

## 2022-12-20 NOTE — Patient Instructions (Addendum)
Visit Information  Thank you for taking time to visit with me today. Please don't hesitate to contact me if I can be of assistance to you.   Following are the goals we discussed today:   Goals Addressed               This Visit's Progress     Patient Stated     manage diabetes [facility]) (pt-stated)        Care Coordination Interventions: Discussed plans with patient for ongoing care management follow up and provided patient with direct contact information for care management team Confirmed a decrease in weight to 208 lbs - Loss of 43 lbs in last 6+ months Encouragement provided & commended her for her hard work Interventions Today    Flowsheet Row Most Recent Value  Chronic Disease   Chronic disease during today's visit Diabetes, Other  [elevations in cbg related to prednisone & steroid injection after a fall at work on Tuesday 12/13/22 0735 -injury to the left side. Now taking Tylenol for pain. Confirmed she reached out to pcp.  light duty at work/pending outpatient therapy schedulling]  General Interventions   General Interventions Discussed/Reviewed General Interventions Discussed, Doctor Visits  [cbg values today 0300 126 use of glipizide & at 0600 12/20/22 cbg 89]  Doctor Visits Discussed/Reviewed Doctor Visits Discussed, PCP, Specialist, Doctor Visits Reviewed  [Encouraged follow up care with her job's work compensation staff/ medical providers- good documentation]  Communication with PCP/Specialists  [message sent to pcp clinical pool]  Pharmacy Interventions   Pharmacy Dicussed/Reviewed Pharmacy Topics Discussed, Medications and their functions  [prednisone, steroid injections, non steroidal pain medications, heat/cold packs]  Safety Interventions   Safety Discussed/Reviewed Safety Discussed, Fall Risk  [fell at work related to tangled items on the floort /fell with injury on left side]             Our next appointment is by telephone on 01/10/23 at 3:30 pm  Please call  the care guide team at [phone] if you need to cancel or reschedule your appointment.   If you are experiencing a Mental Health or Behavioral Health Crisis or need someone to talk to, please call the Suicide and Crisis Lifeline: 988 call the [place] National Suicide Prevention Lifeline: 812-858-9132 or TTY: (878)678-4000 TTY 929-261-6685) to talk to a trained counselor call 1-800-273-TALK (toll free, 24 hour hotline) call the [facility]: [phone] call 911   Patient verbalizes understanding of instructions and care plan provided today and agrees to view in MyChart. Active MyChart status and patient understanding of how to access instructions and care plan via MyChart confirmed with patient.     The patient has been provided with contact information for the care management team and has been advised to call with any health related questions or concerns.   Navjot Loera L. Noelle Penner, RN, BSN, CCM [facility] Care Management Community Coordinator Office number [phone]

## 2022-12-20 NOTE — Patient Outreach (Signed)
  Care Coordination   Follow Up Visit Note   05/03/2023 late entry for 12/20/22 Name: Glenda Thompson MRN: [id number] DOB: 14-Jan-1971  Glenda Thompson is a 52 y.o. year old female who sees Glenda Thompson, Glenda Mallick, MD for primary care. I spoke with  Glenda Thompson by phone today.  What matters to the patients health and wellness today?  Fell at work (? 12/13/22 0735 Feet got on concrete when got tangled in items on the floor), injury to left side. Steroid injection and prednisone increased cbg values Now taking Tylenol as recommended by pcp with better cbg values today  0300 126 0600 89  Question the need to have labs prior to 01/03/23 pcp visit    Goals Addressed               This Visit's Progress     Patient Stated     manage diabetes [facility]) (pt-stated)        Care Coordination Interventions: Discussed plans with patient for ongoing care management follow up and provided patient with direct contact information for care management team Confirmed a decrease in weight to 208 lbs - Loss of 43 lbs in last 6+ months Encouragement provided & commended her for her hard work Interventions Today    Flowsheet Row Most Recent Value  Chronic Disease   Chronic disease during today's visit Diabetes, Other  [elevations in cbg related to prednisone & steroid injection after a fall at work on Tuesday 12/13/22 0735 -injury to the left side. Now taking Tylenol for pain. Confirmed she reached out to pcp.  light duty at work/pending outpatient therapy schedulling]  General Interventions   General Interventions Discussed/Reviewed General Interventions Discussed, Doctor Visits  [cbg values today 0300 126 use of glipizide & at 0600 12/20/22 cbg 89]  Doctor Visits Discussed/Reviewed Doctor Visits Discussed, PCP, Specialist, Doctor Visits Reviewed  [Encouraged follow up care with her job's work compensation staff/ medical providers- good documentation]  Communication with PCP/Specialists  [message sent to pcp  clinical pool]  Pharmacy Interventions   Pharmacy Dicussed/Reviewed Pharmacy Topics Discussed, Medications and their functions  [prednisone, steroid injections, non steroidal pain medications, heat/cold packs]  Safety Interventions   Safety Discussed/Reviewed Safety Discussed, Fall Risk  [fell at work related to tangled items on the floort /fell with injury on left side]             SDOH assessments and interventions completed:  No     Care Coordination Interventions:  Yes, provided   Follow up plan: Follow up call scheduled for 01/10/23    Encounter Outcome:  Pt. Visit Completed   Glenda Sanko L. Noelle Penner, RN, BSN, CCM [facility] Care Management Community Coordinator Office number [phone]

## 2022-12-22 ENCOUNTER — Encounter: Payer: Self-pay | Admitting: Radiology

## 2022-12-23 ENCOUNTER — Ambulatory Visit: Payer: BC Managed Care – PPO | Admitting: Family Medicine

## 2022-12-27 ENCOUNTER — Other Ambulatory Visit: Payer: Self-pay | Admitting: Family Medicine

## 2022-12-30 ENCOUNTER — Encounter: Payer: Self-pay | Admitting: Family Medicine

## 2022-12-30 DIAGNOSIS — E782 Mixed hyperlipidemia: Secondary | ICD-10-CM | POA: Diagnosis not present

## 2022-12-30 DIAGNOSIS — D539 Nutritional anemia, unspecified: Secondary | ICD-10-CM | POA: Diagnosis not present

## 2022-12-30 DIAGNOSIS — E1159 Type 2 diabetes mellitus with other circulatory complications: Secondary | ICD-10-CM | POA: Diagnosis not present

## 2022-12-30 DIAGNOSIS — E559 Vitamin D deficiency, unspecified: Secondary | ICD-10-CM | POA: Diagnosis not present

## 2022-12-30 DIAGNOSIS — I10 Essential (primary) hypertension: Secondary | ICD-10-CM | POA: Diagnosis not present

## 2022-12-31 LAB — VITAMIN D 25 HYDROXY (VIT D DEFICIENCY, FRACTURES): Vit D, 25-Hydroxy: 33.6 ng/mL (ref 30.0–100.0)

## 2022-12-31 LAB — CMP14+EGFR
ALT: 13 IU/L (ref 0–32)
AST: 16 IU/L (ref 0–40)
Albumin/Globulin Ratio: 1.7 (ref 1.2–2.2)
Albumin: 4.5 g/dL (ref 3.8–4.9)
Alkaline Phosphatase: 60 IU/L (ref 44–121)
BUN/Creatinine Ratio: 20 (ref 9–23)
BUN: 25 mg/dL — ABNORMAL HIGH (ref 6–24)
Bilirubin Total: 0.3 mg/dL (ref 0.0–1.2)
CO2: 23 mmol/L (ref 20–29)
Calcium: 10.2 mg/dL (ref 8.7–10.2)
Chloride: 100 mmol/L (ref 96–106)
Creatinine, Ser: 1.24 mg/dL — ABNORMAL HIGH (ref 0.57–1.00)
Globulin, Total: 2.7 g/dL (ref 1.5–4.5)
Glucose: 90 mg/dL (ref 70–99)
Potassium: 4.8 mmol/L (ref 3.5–5.2)
Sodium: 137 mmol/L (ref 134–144)
Total Protein: 7.2 g/dL (ref 6.0–8.5)
eGFR: 52 mL/min/{1.73_m2} — ABNORMAL LOW (ref >59)

## 2022-12-31 LAB — HEMOGLOBIN A1C
Est. average glucose Bld gHb Est-mCnc: 131 mg/dL
Hgb A1c MFr Bld: 6.2 % — ABNORMAL HIGH (ref 4.8–5.6)

## 2022-12-31 LAB — LIPID PANEL
Chol/HDL Ratio: 2.9 ratio (ref 0.0–4.4)
Cholesterol, Total: 172 mg/dL (ref 100–199)
HDL: 60 mg/dL (ref >39)
LDL Chol Calc (NIH): 97 mg/dL (ref 0–99)
Triglycerides: 82 mg/dL (ref 0–149)
VLDL Cholesterol Cal: 15 mg/dL (ref 5–40)

## 2023-01-03 ENCOUNTER — Ambulatory Visit: Payer: BC Managed Care – PPO | Admitting: Family Medicine

## 2023-01-03 ENCOUNTER — Encounter: Payer: Self-pay | Admitting: Family Medicine

## 2023-01-03 VITALS — BP 119/74 | HR 81 | Ht 65.0 in | Wt 214.0 lb

## 2023-01-03 DIAGNOSIS — E1159 Type 2 diabetes mellitus with other circulatory complications: Secondary | ICD-10-CM

## 2023-01-03 DIAGNOSIS — D539 Nutritional anemia, unspecified: Secondary | ICD-10-CM

## 2023-01-03 DIAGNOSIS — I10 Essential (primary) hypertension: Secondary | ICD-10-CM

## 2023-01-03 DIAGNOSIS — R110 Nausea: Secondary | ICD-10-CM | POA: Diagnosis not present

## 2023-01-03 DIAGNOSIS — K5903 Drug induced constipation: Secondary | ICD-10-CM

## 2023-01-03 DIAGNOSIS — D649 Anemia, unspecified: Secondary | ICD-10-CM

## 2023-01-03 DIAGNOSIS — R140 Abdominal distension (gaseous): Secondary | ICD-10-CM | POA: Diagnosis not present

## 2023-01-03 MED ORDER — LINACLOTIDE 72 MCG PO CAPS: 72.0000 ug | ORAL_CAPSULE | Freq: Every day | ORAL | 3 refills | Status: DC

## 2023-01-03 NOTE — Patient Instructions (Addendum)
F/u in 4 months, call if you need me sooner  Microal/creat today, and please arrange H pylori test, dx is dyspepsia, and heartburn  Pls add iron and ferritin to recent lab  You will be referred to GI re constipation and abdominal pain  New for constipation is linzesse  Mammogram due in August to be scheduled at checkout  Good foot exam  It is important that you exercise regularly at least 30 minutes 5 times a week. If you develop chest pain, have severe difficulty breathing, or feel very tired, stop exercising immediately and seek medical attention    Thanks for choosing [facility], we consider it a privelige to serve you.

## 2023-01-04 ENCOUNTER — Other Ambulatory Visit (HOSPITAL_COMMUNITY): Payer: Self-pay | Admitting: Family Medicine

## 2023-01-04 DIAGNOSIS — Z1231 Encounter for screening mammogram for malignant neoplasm of breast: Secondary | ICD-10-CM

## 2023-01-07 LAB — MICROALBUMIN / CREATININE URINE RATIO
Creatinine, Urine: 103.3 mg/dL
Microalb/Creat Ratio: <3 mg/g creat (ref 0–29)
Microalbumin, Urine: <3 ug/mL

## 2023-01-08 ENCOUNTER — Encounter: Payer: Self-pay | Admitting: Family Medicine

## 2023-01-08 DIAGNOSIS — D649 Anemia, unspecified: Secondary | ICD-10-CM | POA: Insufficient documentation

## 2023-01-08 DIAGNOSIS — E66811 Obesity, class 1: Secondary | ICD-10-CM | POA: Insufficient documentation

## 2023-01-08 DIAGNOSIS — R110 Nausea: Secondary | ICD-10-CM | POA: Insufficient documentation

## 2023-01-08 NOTE — Assessment & Plan Note (Signed)
Glenda Thompson is reminded of the importance of commitment to daily physical activity for 30 minutes or more, as able and the need to limit carbohydrate intake to 30 to 60 grams per meal to help with blood sugar control.   The need to take medication as prescribed, test blood sugar as directed, and to call between visits if there is a concern that blood sugar is uncontrolled is also discussed.   Glenda Thompson is reminded of the importance of daily foot exam, annual eye examination, and good blood sugar, blood pressure and cholesterol control.     Latest Ref Rng & Units 01/03/2023    5:00 PM 12/30/2022    8:45 AM 10/28/2022   10:54 PM 09/21/2022    9:20 AM 06/10/2022    8:34 AM  Diabetic Labs  HbA1c 4.8 - 5.6 %  6.2   6.3  7.7   Micro/Creat Ratio 0 - 29 mg/g creat <3       Chol 100 - 199 mg/dL  172   134  179   HDL >39 mg/dL  60   53  73   Calc LDL 0 - 99 mg/dL  97   66  89   Triglycerides 0 - 149 mg/dL  82   77  97   Creatinine 0.57 - 1.00 mg/dL  1.24  1.53  1.52  1.23       01/03/2023    4:27 PM 10/29/2022    1:06 AM 10/29/2022   12:00 AM 10/28/2022   11:45 PM 10/28/2022    9:36 PM 10/28/2022    9:34 PM 08/11/2022    4:33 PM  BP/Weight  Systolic BP [id number]  [id number]  Diastolic BP 74 54 57 57 54  80  Wt. (Lbs) 214     219   BMI 35.61 kg/m2     36.44 kg/m2       Latest Ref Rng & Units 03/18/2022    4:00 PM 03/03/2022   12:00 AM  Foot/eye exam completion dates  Eye Exam No Retinopathy  No Retinopathy      Foot Form Completion  Done      This result is from an external source.      Controlled and improved , continue current meds

## 2023-01-08 NOTE — Assessment & Plan Note (Signed)
Additional labs to determine underlying defiency

## 2023-01-08 NOTE — Assessment & Plan Note (Signed)
  Patient re-educated about  the importance of commitment to a  minimum of 150 minutes of exercise per week as able.  The importance of healthy food choices with portion control discussed, as well as eating regularly and within a 12 hour window most days. The need to choose "clean , green" food 50 to 75% of the time is discussed, as well as to make water the primary drink and set a goal of 64 ounces water daily.       01/03/2023    4:27 PM 10/28/2022    9:34 PM 08/11/2022    4:05 PM  Weight /BMI  Weight 214 lb 219 lb 232 lb 0.6 oz  Height 5\' 5"  (1.651 m) 5\' 5"  (1.651 m) 5\' 5"  (1.651 m)  BMI 35.61 kg/m2 36.44 kg/m2 38.61 kg/m2    Improving continue lifestyle modification for weight loss

## 2023-01-08 NOTE — Assessment & Plan Note (Signed)
Worsening , trial of linzess and G I referral

## 2023-01-08 NOTE — Assessment & Plan Note (Signed)
Controlled, no change in medication DASH diet and commitment to daily physical activity for a minimum of 30 minutes discussed and encouraged, as a part of hypertension management. The importance of attaining a healthy weight is also discussed.     01/03/2023    4:27 PM 10/29/2022    1:06 AM 10/29/2022   12:00 AM 10/28/2022   11:45 PM 10/28/2022    9:36 PM 10/28/2022    9:34 PM 08/11/2022    4:33 PM  BP/Weight  Systolic BP [id number]  [id number]  Diastolic BP 74 54 57 57 54  80  Wt. (Lbs) 214     219   BMI 35.61 kg/m2     36.44 kg/m2

## 2023-01-08 NOTE — Assessment & Plan Note (Signed)
Disabling/ worsening nausea, bloating and RUQ pain, refer for RUQ ultrasound, needs H pylori test and Gi eval

## 2023-01-08 NOTE — Progress Notes (Signed)
Glenda Thompson     MRN: [id number]      DOB: Jan 16, 1971   HPI Glenda Thompson is here for follow up and re-evaluation of chronic medical conditions, medication management and review of any available recent lab and radiology data.  Preventive health is updated, specifically  Cancer screening and Immunization.   Questions or concerns regarding consultations or procedures which the PT has had in the interim are  addressed. The PT denies any adverse reactions to current medications since the last visit.  C/o recurrent nausea , bloating and RUQ pain C/o chronic constipation and abdominal pan, no nausea  ROS Denies recent fever or chills. Denies sinus pressure, nasal congestion, ear pain or sore throat. Denies chest congestion, productive cough or wheezing. Denies chest pains, palpitations and leg swelling   Denies dysuria, frequency, hesitancy or incontinence. Chronic  limitation in mobility.Disabled Denies headaches, seizures,  Denies depression, anxiety or insomnia. Denies skin break down or rash.   PE  BP 119/74 (BP Location: Right Arm, Patient Position: Sitting, Cuff Size: Large)   Pulse 81   Ht 5\' 5"  (1.651 m)   Wt 214 lb (97.1 kg)   LMP 11/02/2017   SpO2 98%   BMI 35.61 kg/m   Patient alert and oriented and in no cardiopulmonary distress.  HEENT: No facial asymmetry, EOMI,     Neck supple .  Chest: Clear to auscultation bilaterally.  CVS: S1, S2 no murmurs, no S3.Regular rate.  ABD: Soft non tender.   Ext: No edema  [id number]  ROM spine, hips and knees.  Skin: Intact, no ulcerations or rash noted.  Psych: Good eye contact, normal affect. Memory intact not anxious or depressed appearing.  CNS: CN 2-12 intact, power,  normal throughout.no focal deficits noted.   Assessment & Plan  Type 2 diabetes mellitus with vascular disease [facility]) Glenda Thompson is reminded of the importance of commitment to daily physical activity for 30 minutes or more, as able and the  need to limit carbohydrate intake to 30 to 60 grams per meal to help with blood sugar control.   The need to take medication as prescribed, test blood sugar as directed, and to call between visits if there is a concern that blood sugar is uncontrolled is also discussed.   Glenda Thompson is reminded of the importance of daily foot exam, annual eye examination, and good blood sugar, blood pressure and cholesterol control.     Latest Ref Rng & Units 01/03/2023    5:00 PM 12/30/2022    8:45 AM 10/28/2022   10:54 PM 09/21/2022    9:20 AM 06/10/2022    8:34 AM  Diabetic Labs  HbA1c 4.8 - 5.6 %  6.2   6.3  7.7   Micro/Creat Ratio 0 - 29 mg/g creat <3       Chol 100 - 199 mg/dL  172   134  179   HDL >39 mg/dL  60   53  73   Calc LDL 0 - 99 mg/dL  97   66  89   Triglycerides 0 - 149 mg/dL  82   77  97   Creatinine 0.57 - 1.00 mg/dL  1.24  1.53  1.52  1.23       01/03/2023    4:27 PM 10/29/2022    1:06 AM 10/29/2022   12:00 AM 10/28/2022   11:45 PM 10/28/2022    9:36 PM 10/28/2022    9:34 PM 08/11/2022    4:33 PM  BP/Weight  Systolic BP [id number]  [id number]  Diastolic BP 74 54 57 57 54  80  Wt. (Lbs) 214     219   BMI 35.61 kg/m2     36.44 kg/m2       Latest Ref Rng & Units 03/18/2022    4:00 PM 03/03/2022   12:00 AM  Foot/eye exam completion dates  Eye Exam No Retinopathy  No Retinopathy      Foot Form Completion  Done      This result is from an external source.      Controlled and improved , continue current meds  Essential hypertension, benign Controlled, no change in medication DASH diet and commitment to daily physical activity for a minimum of 30 minutes discussed and encouraged, as a part of hypertension management. The importance of attaining a healthy weight is also discussed.     01/03/2023    4:27 PM 10/29/2022    1:06 AM 10/29/2022   12:00 AM 10/28/2022   11:45 PM 10/28/2022    9:36 PM 10/28/2022    9:34 PM 08/11/2022    4:33 PM  BP/Weight  Systolic BP [id number]   [id number]  Diastolic BP 74 54 57 57 54  80  Wt. (Lbs) 214     219   BMI 35.61 kg/m2     36.44 kg/m2        Morbid obesity  Patient re-educated about  the importance of commitment to a  minimum of 150 minutes of exercise per week as able.  The importance of healthy food choices with portion control discussed, as well as eating regularly and within a 12 hour window most days. The need to choose "clean , green" food 50 to 75% of the time is discussed, as well as to make water the primary drink and set a goal of 64 ounces water daily.       01/03/2023    4:27 PM 10/28/2022    9:34 PM 08/11/2022    4:05 PM  Weight /BMI  Weight 214 lb 219 lb 232 lb 0.6 oz  Height 5\' 5"  (1.651 m) 5\' 5"  (1.651 m) 5\' 5"  (1.651 m)  BMI 35.61 kg/m2 36.44 kg/m2 38.61 kg/m2    Improving continue lifestyle modification for weight loss  Constipation Worsening , trial of linzess and G I referral  Nausea Disabling/ worsening nausea, bloating and RUQ pain, refer for RUQ ultrasound, needs H pylori test and Gi eval  Anemia Additional labs to determine underlying defiency

## 2023-01-10 ENCOUNTER — Ambulatory Visit: Payer: Self-pay | Admitting: *Deleted

## 2023-01-10 NOTE — Patient Outreach (Signed)
  Care Coordination   Follow Up Visit Note   05/03/2023 late entry for 01/10/23  Name: Glenda Thompson MRN: [id number] DOB: 09/04/1971  Glenda Thompson is a 53 y.o. year old female who sees Glenda Thompson, Glenda Mallick, MD for primary care. I spoke with  Glenda Thompson by phone today.  What matters to the patients health and wellness today?  Has not been able to get her Mounjaro  Diabetes- HgA1c 6.2 Dehydration management to be started Limes, vinegar and cucumber intake-  vegetables with high fluid content to assist with  weight management Encouragement provided to get back on track, judging herself less, being aware of her triggers/cravings     Goals Addressed               This Visit's Progress     Patient Stated     manage diabetes ([facility]) (pt-stated)        Care Coordination Interventions: Discussed plans with patient for ongoing care management follow up and provided patient with direct contact information for care management team Interventions Today    Flowsheet Row Most Recent Value  Chronic Disease   Chronic disease during today's visit Diabetes, Other  [dehydration, weight management]  General Interventions   General Interventions Discussed/Reviewed General Interventions Reviewed, Doctor Visits  Doctor Visits Discussed/Reviewed PCP, Doctor Visits Reviewed  PCP/Specialist Visits Compliance with follow-up visit  Exercise Interventions   Exercise Discussed/Reviewed Exercise Reviewed, Weight Managment  Weight Management Weight loss  Education Interventions   Education Provided Provided Education  [foods with water content]  Provided Verbal Education On Nutrition, Exercise, Medication  Mental Health Interventions   Mental Health Discussed/Reviewed Mental Health Discussed, Coping Strategies            SDOH assessments and interventions completed:  No     Care Coordination Interventions:  Yes, provided   Follow up plan: Follow up call scheduled for 05/23/23     Encounter Outcome:  Pt. Visit Completed   Glenda Dever L. Noelle Penner, RN, BSN, CCM [facility] Care Management Community Coordinator Office number [phone]

## 2023-01-17 ENCOUNTER — Ambulatory Visit (HOSPITAL_COMMUNITY)
Admission: RE | Admit: 2023-01-17 | Discharge: 2023-01-17 | Disposition: A | Discharge status: Home / Self Care | Payer: BC Managed Care – PPO | Source: Ambulatory Visit | Attending: Family Medicine | Admitting: Family Medicine

## 2023-01-17 DIAGNOSIS — R140 Abdominal distension (gaseous): Secondary | ICD-10-CM | POA: Insufficient documentation

## 2023-01-17 DIAGNOSIS — R110 Nausea: Secondary | ICD-10-CM | POA: Insufficient documentation

## 2023-01-17 DIAGNOSIS — R109 Unspecified abdominal pain: Secondary | ICD-10-CM | POA: Diagnosis not present

## 2023-01-17 DIAGNOSIS — R112 Nausea with vomiting, unspecified: Secondary | ICD-10-CM | POA: Diagnosis not present

## 2023-01-18 ENCOUNTER — Other Ambulatory Visit: Payer: Self-pay | Admitting: Family Medicine

## 2023-01-27 LAB — IRON: Iron: 51 ug/dL (ref 27–159)

## 2023-01-27 LAB — FERRITIN: Ferritin: 331 ng/mL — ABNORMAL HIGH (ref 15–150)

## 2023-01-27 LAB — SPECIMEN STATUS REPORT

## 2023-02-04 ENCOUNTER — Other Ambulatory Visit: Payer: Self-pay | Admitting: Family Medicine

## 2023-02-07 ENCOUNTER — Telehealth: Payer: Self-pay | Admitting: Family Medicine

## 2023-02-07 NOTE — Telephone Encounter (Signed)
FMLA   Copied Noted sleeved 

## 2023-02-09 DIAGNOSIS — Z0279 Encounter for issue of other medical certificate: Secondary | ICD-10-CM

## 2023-02-14 NOTE — Telephone Encounter (Signed)
Patient picked up forms and paid.  Forms were faxed.

## 2023-02-14 NOTE — Telephone Encounter (Signed)
Called patient to pick up forms, needs patient signature

## 2023-03-07 ENCOUNTER — Other Ambulatory Visit: Payer: Self-pay | Admitting: Family Medicine

## 2023-04-21 ENCOUNTER — Telehealth: Payer: Self-pay | Admitting: Family Medicine

## 2023-04-21 MED ORDER — LINACLOTIDE 72 MCG PO CAPS: 72.0000 ug | ORAL_CAPSULE | Freq: Every day | ORAL | 5 refills | Status: DC

## 2023-04-21 NOTE — Telephone Encounter (Signed)
LMTRC-KG 

## 2023-04-21 NOTE — Telephone Encounter (Signed)
Had voiced request for GI referral to Endo due to GI symptoms, in 11/2022. pls call her ask if she is still having GI symptoms that concern her and document them if she does. I will refer her if she does, pls send me a response , thanks

## 2023-04-21 NOTE — Addendum Note (Signed)
Addended by: Kerri Perches on: 04/21/2023 04:23 PM   Modules accepted: Orders

## 2023-04-27 ENCOUNTER — Other Ambulatory Visit: Payer: Self-pay | Admitting: Family Medicine

## 2023-05-01 ENCOUNTER — Other Ambulatory Visit: Payer: Self-pay | Admitting: Family Medicine

## 2023-05-01 MED ORDER — TRULANCE 3 MG PO TABS: 3.0000 mg | ORAL_TABLET | Freq: Every day | ORAL | 3 refills | Status: DC

## 2023-05-01 NOTE — Progress Notes (Signed)
Already refilled

## 2023-05-03 ENCOUNTER — Other Ambulatory Visit: Payer: Self-pay | Admitting: Family Medicine

## 2023-05-03 NOTE — Patient Instructions (Signed)
Visit Information  Thank you for taking time to visit with me today. Please don't hesitate to contact me if I can be of assistance to you.   Following are the goals we discussed today:   To increase hydration, add these 11 fruits and vegetables with high water content: [email or web address] Melons such as watermelon, honeydew and cantaloupe Strawberries Pineapple Peaches Oranges Bell peppers Broccoli Celery Cucumbers Lettuce Summer squash such as zucchini and yellow squash (cooked)    Goals Addressed               This Visit's Progress     Patient Stated     manage diabetes ([facility]) (pt-stated)        Care Coordination Interventions: Discussed plans with patient for ongoing care management follow up and provided patient with direct contact information for care management team Interventions Today    Flowsheet Row Most Recent Value  Chronic Disease   Chronic disease during today's visit Diabetes, Other  [dehydration, weight management]  General Interventions   General Interventions Discussed/Reviewed General Interventions Reviewed, Doctor Visits  Doctor Visits Discussed/Reviewed PCP, Doctor Visits Reviewed  PCP/Specialist Visits Compliance with follow-up visit  Exercise Interventions   Exercise Discussed/Reviewed Exercise Reviewed, Weight Managment  Weight Management Weight loss  Education Interventions   Education Provided Provided Education  [foods with water content]  Provided Verbal Education On Nutrition, Exercise, Medication  Mental Health Interventions   Mental Health Discussed/Reviewed Mental Health Discussed, Coping Strategies            Our next appointment is by telephone on 05/23/23 at 2:15 pm  Please call the care guide team at [phone] if you need to cancel or reschedule your appointment.   If you are experiencing a Mental Health or Behavioral Health Crisis or need someone to talk to, please  call the Suicide and Crisis Lifeline: 988 call the [place] National Suicide Prevention Lifeline: 712 489 1361 or TTY: 7167904902 TTY 202-301-0096) to talk to a trained counselor call 1-800-273-TALK (toll free, 24 hour hotline) call the [facility]: [phone] call 911   Patient verbalizes understanding of instructions and care plan provided today and agrees to view in MyChart. Active MyChart status and patient understanding of how to access instructions and care plan via MyChart confirmed with patient.     The patient has been provided with contact information for the care management team and has been advised to call with any health related questions or concerns.   Jamisen Duerson L. Noelle Penner, RN, BSN, CCM [facility] Care Management Community Coordinator Office number [phone]

## 2023-05-10 ENCOUNTER — Ambulatory Visit: Payer: BC Managed Care – PPO | Admitting: Family Medicine

## 2023-05-23 ENCOUNTER — Ambulatory Visit: Payer: Self-pay | Admitting: *Deleted

## 2023-05-23 NOTE — Patient Outreach (Signed)
  Care Coordination   05/23/2023 Name: Glenda Thompson MRN: [id number] DOB: Jul 04, 1971   Care Coordination Outreach Attempts:  An unsuccessful telephone outreach was attempted for a scheduled appointment today.  Follow Up Plan:  Additional outreach attempts will be made to offer the patient care coordination information and services.   Encounter Outcome:  No Answer   Care Coordination Interventions:  No, not indicated    Daquana Paddock L. Noelle Penner, RN, BSN, CCM [facility] Care Management Community Coordinator Office number [phone]

## 2023-05-31 ENCOUNTER — Telehealth: Payer: Self-pay | Admitting: *Deleted

## 2023-05-31 NOTE — Progress Notes (Unsigned)
  Care Coordination Note  05/31/2023 Name: [name] MRN: [id number] DOB: May 31, 1971  Glenda Thompson is a 52 y.o. year old female who is a primary care patient of Kerri Perches, MD and is actively engaged with the care management team. I reached out to [name] by phone today to assist with re-scheduling a follow up visit with the RN Case Manager  Follow up plan: Unsuccessful telephone outreach attempt made. A HIPAA compliant phone message was left for the patient providing contact information and requesting a return call.   [facility]  Care Coordination Care Guide  Direct Dial: [phone]

## 2023-06-01 NOTE — Progress Notes (Signed)
  Care Coordination Note  06/01/2023 Name: Glenda Thompson MRN: [id number] DOB: 1971/01/19  Glenda Thompson is a 52 y.o. year old female who is a primary care patient of Kerri Perches, MD and is actively engaged with the care management team. I reached out to [name] by phone today to assist with re-scheduling a follow up visit with the RN Case Manager  Follow up plan: Telephone appointment with care management team member scheduled for:8/13  [facility] Coordination Care Guide  Direct Dial: [phone]

## 2023-06-06 ENCOUNTER — Ambulatory Visit: Payer: Self-pay | Admitting: *Deleted

## 2023-06-06 NOTE — Patient Instructions (Addendum)
Visit Information  Thank you for taking time to visit with me today. Please don't hesitate to contact me if I can be of assistance to you.   Following are the goals we discussed today:    Seasonal foods with water content: Foods that count when you are on a fluid restriction: watermelon, cucumber, pineapple, oranges, straw berries, blueberries Coffee, tea, ice cream, popcicles, juice, milk, sherbet, soup, ice chips, grapes, plums    Goals Addressed               This Visit's Progress     Patient Stated     manage diabetes (THN) (pt-stated)        Care Coordination Interventions: Discussed plans with patient for ongoing care management follow up and provided patient with direct contact information for care management team Interventions Today    Flowsheet Row Most Recent Value  Chronic Disease   Chronic disease during today's visit Diabetes, Other  [dehydration, weight management]  General Interventions   General Interventions Discussed/Reviewed General Interventions Reviewed, Doctor Visits  Doctor Visits Discussed/Reviewed PCP, Doctor Visits Reviewed  PCP/Specialist Visits Compliance with follow-up visit  Exercise Interventions   Exercise Discussed/Reviewed Exercise Reviewed, Weight Managment  Weight Management Weight loss  Education Interventions   Education Provided Provided Education  [foods with water content]  Provided Verbal Education On Nutrition, Exercise, Medication  Mental Health Interventions   Mental Health Discussed/Reviewed Mental Health Discussed, Coping Strategies             Our next appointment is by telephone on 07/06/23 at 4:30 pm  Please call the care guide team at [phone] if you need to cancel or reschedule your appointment.   If you are experiencing a Mental Health or Behavioral Health Crisis or need someone to talk to, please call the Suicide and Crisis Lifeline: 988 call the [place] National Suicide Prevention Lifeline: 364-842-5273 or  TTY: (616)264-7975 TTY 308-298-1939) to talk to a trained counselor call 1-800-273-TALK (toll free, 24 hour hotline) call the [facility]: [phone] call 911   Patient verbalizes understanding of instructions and care plan provided today and agrees to view in MyChart. Active MyChart status and patient understanding of how to access instructions and care plan via MyChart confirmed with patient.     The patient has been provided with contact information for the care management team and has been advised to call with any health related questions or concerns.   Joann Jorge L. Noelle Penner, RN, BSN, CCM [facility] Care Management Community Coordinator Office number [phone]

## 2023-06-09 NOTE — Patient Outreach (Signed)
  Care Coordination   Follow Up Visit Note   06/09/2023 Update note for 06/06/23 Name: Glenda Thompson MRN: [id number] DOB: [birth year]  Glenda Thompson is a 52 y.o. year old female who sees Kerri Perches, MD for primary care. I spoke with  [name] by phone today.  What matters to the patients health and wellness today?  Voiced concern with gastric symptoms after taking in certain foods. ([employer]) Continues on [place] Voiced understanding her gastric symptoms may be related to her use of Mounjaro and indicating something that is not preferred with the use of Mounjaro   Goals Addressed               This Visit's Progress     Patient Stated     manage diabetes (THN) (pt-stated)        Care Coordination Interventions: Discussed plans with patient for ongoing care management follow up and provided patient with direct contact information for care management team Interventions Today    Flowsheet Row Most Recent Value  Chronic Disease   Chronic disease during today's visit Diabetes, Other  [dehydration, weight management]  General Interventions   General Interventions Discussed/Reviewed General Interventions Reviewed, Doctor Visits  Doctor Visits Discussed/Reviewed PCP, Doctor Visits Reviewed  PCP/Specialist Visits Compliance with follow-up visit  Exercise Interventions   Exercise Discussed/Reviewed Exercise Reviewed, Weight Managment  Weight Management Weight loss  Education Interventions   Education Provided Provided Education  [foods with water content]  Provided Verbal Education On Nutrition, Exercise, Medication  Mental Health Interventions   Mental Health Discussed/Reviewed Mental Health Discussed, Coping Strategies             SDOH assessments and interventions completed:  No     Care Coordination Interventions:  Yes, provided   Follow up plan: Follow up call scheduled for 07/06/23    Encounter Outcome:  Pt. Visit Completed   Nidhi Jacome L. Noelle Penner,  RN, BSN, CCM [facility] Care Management Community Coordinator Office number [phone]

## 2023-06-14 ENCOUNTER — Inpatient Hospital Stay (HOSPITAL_COMMUNITY): Admission: RE | Admit: 2023-06-14 | Payer: BC Managed Care – PPO | Source: Ambulatory Visit

## 2023-06-14 DIAGNOSIS — D539 Nutritional anemia, unspecified: Secondary | ICD-10-CM | POA: Diagnosis not present

## 2023-06-19 ENCOUNTER — Ambulatory Visit (HOSPITAL_COMMUNITY)
Admission: RE | Admit: 2023-06-19 | Discharge: 2023-06-19 | Disposition: A | Discharge status: Home / Self Care | Payer: BC Managed Care – PPO | Source: Ambulatory Visit | Attending: Family Medicine | Admitting: Family Medicine

## 2023-06-19 DIAGNOSIS — Z1231 Encounter for screening mammogram for malignant neoplasm of breast: Secondary | ICD-10-CM | POA: Diagnosis not present

## 2023-06-20 ENCOUNTER — Ambulatory Visit: Payer: BC Managed Care – PPO | Admitting: Family Medicine

## 2023-06-20 ENCOUNTER — Encounter: Payer: Self-pay | Admitting: Family Medicine

## 2023-06-20 VITALS — BP 130/79 | HR 81 | Ht 65.0 in | Wt 205.1 lb

## 2023-06-20 DIAGNOSIS — E559 Vitamin D deficiency, unspecified: Secondary | ICD-10-CM

## 2023-06-20 DIAGNOSIS — I10 Essential (primary) hypertension: Secondary | ICD-10-CM

## 2023-06-20 DIAGNOSIS — Z23 Encounter for immunization: Secondary | ICD-10-CM

## 2023-06-20 DIAGNOSIS — R110 Nausea: Secondary | ICD-10-CM

## 2023-06-20 DIAGNOSIS — E1159 Type 2 diabetes mellitus with other circulatory complications: Secondary | ICD-10-CM

## 2023-06-20 DIAGNOSIS — E782 Mixed hyperlipidemia: Secondary | ICD-10-CM

## 2023-06-20 DIAGNOSIS — Z7985 Long-term (current) use of injectable non-insulin antidiabetic drugs: Secondary | ICD-10-CM

## 2023-06-20 NOTE — Patient Instructions (Addendum)
Annual exam mid to end January, call if you need me sooner  Good foot exam  You are referred to GI  Flu vaccine today  'cBC, lipid, fasting cmp and EGFr, HBa1C AND tsh NEXT WEEK  It is important that you exercise regularly at least 30 minutes 5 times a week. If you develop chest pain, have severe difficulty breathing, or feel very tired, stop exercising immediately and seek medical attention   Thanks for choosing [facility], we consider it a privelige to serve you.

## 2023-06-21 ENCOUNTER — Ambulatory Visit: Payer: Self-pay | Admitting: *Deleted

## 2023-06-21 ENCOUNTER — Telehealth: Payer: Self-pay

## 2023-06-21 DIAGNOSIS — R109 Unspecified abdominal pain: Secondary | ICD-10-CM

## 2023-06-21 DIAGNOSIS — R110 Nausea: Secondary | ICD-10-CM

## 2023-06-21 NOTE — Telephone Encounter (Signed)
Glenda Thompson with [facility] requesting referral to GI be Urgent states she spoke with patient this morning and patient reported leaving here yesterday to go eat pizza and a salad after eating she went to the restroom and passed out refused to go to hospital feels better this morning but wants to see GI asap

## 2023-06-21 NOTE — Patient Outreach (Signed)
  Care Coordination   Incoming call from patient  Visit Note   06/21/2023 Name: Glenda Thompson MRN: [id number] DOB: 04-20-1971  Glenda Thompson is a 52 y.o. year old female who sees Lodema Hong, Milus Mallick, MD for primary care. I spoke with  Glenda Thompson by phone today.  What matters to the patients health and wellness today?  Worsening abdominal pain, syncope on 06/21/23   Glenda Thompson called RN CM during her brief work break to request assist with getting her GI referral rushed.  She states on 06/20/23 she experience a 10+ abdominal pain level after she ate pizza and a salad. She went to the restroom, was nauseated, hot, sweaty (took off her shirt) and was found by her husband passed out. She reports she requested her husband not call EMS after she felt better.    She reports eating a piece of bacon and bread this  morning prior to work and the worsening GI symptoms have returned to a pain level of 8.   She reports being able to take another brief work break at 11 am today    Goals Addressed               This Visit's Progress     Patient Stated     Prevent syncope (vasovagal) episodes, Expedite GI referral, manage diabetes with use of Mounjaro((RN CM care coordination services) (pt-stated)        Care Coordination Interventions: Discussed plans with patient for ongoing care management follow up and provided patient with direct contact information for care management team Interventions Today    Flowsheet Row Most Recent Value  Chronic Disease   Chronic disease during today's visit Other, Diabetes  [worsening abdominal pain, syncope on 06/20/23]  General Interventions   General Interventions Discussed/Reviewed General Interventions Reviewed, Sick Day Rules, Doctor Visits, Communication with  Doctor Visits Discussed/Reviewed Doctor Visits Reviewed, PCP  PCP/Specialist Visits Compliance with follow-up visit  Communication with --  [spoke with pcp RN about reported patient syncope  on 06/20/23 and request to Expedite GI referral]  Education Interventions   Education Provided --  [discussed syncope, valsava maneuver , abdominal pain, Previously discussed mounjaro side effects]  Provided Verbal Education On Medication, Sick Day Rules, Other  [vasovagal syncope, syncope, tirzepatide]  Nutrition Interventions   Nutrition Discussed/Reviewed Nutrition Reviewed, Decreasing fats  Pharmacy Interventions   Pharmacy Dicussed/Reviewed Pharmacy Topics Reviewed, Medications and their functions             SDOH assessments and interventions completed:  No     Care Coordination Interventions:  Yes, provided   Follow up plan: Follow up call scheduled for 07/06/23    Encounter Outcome:  Pt. Visit Completed   Glenda Thompson L. Noelle Penner, RN, BSN, CCM [facility] Care Management Community Coordinator Office number [phone]

## 2023-06-21 NOTE — Patient Instructions (Addendum)
Visit Information  Thank you for taking time to visit with me today. Please don't hesitate to contact me if I can be of assistance to you.   Dr Lodema Hong nurse is working with Dr Lodema Hong & the referral coordinator to get the GI consult expedited!  Vasovagal syncope (vay-zoh-VAY-gul SING-kuh-pee) occurs when you faint because your body overreacts to certain triggers, such as the sight of blood or extreme emotional distress. It may also be called neurocardiogenic syncope. The vasovagal syncope trigger causes your heart rate and blood pressure to drop suddenly  More information at this website  [email or web address]  Following are the goals we discussed today:   Goals Addressed               This Visit's Progress     Patient Stated     Prevent syncope (vasovagal) episodes, Expedite GI referral, manage diabetes with use of Mounjaro((RN CM care coordination services) (pt-stated)        Care Coordination Interventions: Discussed plans with patient for ongoing care management follow up and provided patient with direct contact information for care management team Interventions Today    Flowsheet Row Most Recent Value  Chronic Disease   Chronic disease during today's visit Other, Diabetes  [worsening abdominal pain, syncope on 06/20/23]  General Interventions   General Interventions Discussed/Reviewed General Interventions Reviewed, Sick Day Rules, Doctor Visits, Communication with  Doctor Visits Discussed/Reviewed Doctor Visits Reviewed, PCP  PCP/Specialist Visits Compliance with follow-up visit  Communication with --  [spoke with pcp RN about reported patient syncope on 06/20/23 and request to Expedite GI referral]  Education Interventions   Education Provided --  [discussed syncope, valsava maneuver , abdominal pain, Previously discussed mounjaro side effects]  Provided Verbal Education On Medication, Sick Day Rules,  Other  [vasovagal syncope, syncope, tirzepatide]  Nutrition Interventions   Nutrition Discussed/Reviewed Nutrition Reviewed, Decreasing fats  Pharmacy Interventions   Pharmacy Dicussed/Reviewed Pharmacy Topics Reviewed, Medications and their functions             Our next appointment is by telephone on 07/06/23 at 4:30 pm  Please call the care guide team at [phone] if you need to cancel or reschedule your appointment.   If you are experiencing a Mental Health or Behavioral Health Crisis or need someone to talk to, please call the Suicide and Crisis Lifeline: 988 call the [place] National Suicide Prevention Lifeline: 973 045 1476 or TTY: 650-419-1239 TTY (947) 765-0606) to talk to a trained counselor call 1-800-273-TALK (toll free, 24 hour hotline) call the [facility]: [phone] call 911   Patient verbalizes understanding of instructions and care plan provided today and agrees to view in MyChart. Active MyChart status and patient understanding of how to access instructions and care plan via MyChart confirmed with patient.     The patient has been provided with contact information for the care management team and has been advised to call with any health related questions or concerns.   Yailen Zemaitis L. Noelle Penner, RN, BSN, CCM [facility] Care Management Community Coordinator Office number [phone]

## 2023-06-22 ENCOUNTER — Encounter: Payer: Self-pay | Admitting: Family Medicine

## 2023-06-22 DIAGNOSIS — Z23 Encounter for immunization: Secondary | ICD-10-CM | POA: Insufficient documentation

## 2023-06-22 NOTE — Addendum Note (Signed)
Addended by: Kerri Perches on: 06/22/2023 05:56 AM   Modules accepted: Orders

## 2023-06-22 NOTE — Assessment & Plan Note (Signed)
Controlled, no change in medication DASH diet and commitment to daily physical activity for a minimum of 30 minutes discussed and encouraged, as a part of hypertension management. The importance of attaining a healthy weight is also discussed.     06/20/2023    4:17 PM 01/03/2023    4:27 PM 10/29/2022    1:06 AM 10/29/2022   12:00 AM 10/28/2022   11:45 PM 10/28/2022    9:36 PM 10/28/2022    9:34 PM  BP/Weight  Systolic BP 130 119 112 121 123 123   Diastolic BP 79 74 54 57 57 54   Wt. (Lbs) 205.12 214     219  BMI 34.13 kg/m2 35.61 kg/m2     36.44 kg/m2

## 2023-06-22 NOTE — Assessment & Plan Note (Signed)
Hyperlipidemia:Low fat diet discussed and encouraged.   Lipid Panel  Lab Results  Component Value Date   CHOL 172 12/30/2022   HDL 60 12/30/2022   LDLCALC 97 12/30/2022   TRIG 82 12/30/2022   CHOLHDL 2.9 12/30/2022     Controlled, no change in medication

## 2023-06-22 NOTE — Assessment & Plan Note (Signed)
Ms. Daughtry is reminded of the importance of commitment to daily physical activity for 30 minutes or more, as able and the need to limit carbohydrate intake to 30 to 60 grams per meal to help with blood sugar control.   The need to take medication as prescribed, test blood sugar as directed, and to call between visits if there is a concern that blood sugar is uncontrolled is also discussed.   Ms. Guisti is reminded of the importance of daily foot exam, annual eye examination, and good blood sugar, blood pressure and cholesterol control.     Latest Ref Rng & Units 01/03/2023    5:00 PM 12/30/2022    8:45 AM 10/28/2022   10:54 PM 09/21/2022    9:20 AM 06/10/2022    8:34 AM  Diabetic Labs  HbA1c 4.8 - 5.6 %  6.2   6.3  7.7   Micro/Creat Ratio 0 - 29 mg/g creat <3       Chol 100 - 199 mg/dL  409   811  914   HDL >78 mg/dL  60   53  73   Calc LDL 0 - 99 mg/dL  97   66  89   Triglycerides 0 - 149 mg/dL  82   77  97   Creatinine 0.57 - 1.00 mg/dL  2.95  6.21  3.08  6.57       06/20/2023    4:17 PM 01/03/2023    4:27 PM 10/29/2022    1:06 AM 10/29/2022   12:00 AM 10/28/2022   11:45 PM 10/28/2022    9:36 PM 10/28/2022    9:34 PM  BP/Weight  Systolic BP 130 119 112 121 123 123   Diastolic BP 79 74 54 57 57 54   Wt. (Lbs) 205.12 214     219  BMI 34.13 kg/m2 35.61 kg/m2     36.44 kg/m2      Latest Ref Rng & Units 03/18/2022    4:00 PM 03/03/2022   12:00 AM  Foot/eye exam completion dates  Eye Exam No Retinopathy  No Retinopathy      Foot Form Completion  Done      This result is from an external source.   Currently well controlled , however due to severe nausea , may need to d/c mounjaro, trying  to contact pt esp due to tele message received yesterday, unable to speak with her so far

## 2023-06-22 NOTE — Assessment & Plan Note (Signed)
  Patient re-educated about  the importance of commitment to a  minimum of 150 minutes of exercise per week as able.  The importance of healthy food choices with portion control discussed, as well as eating regularly and within a 12 hour window most days. The need to choose "clean , green" food 50 to 75% of the time is discussed, as well as to make water the primary drink and set a goal of 64 ounces water daily.       06/20/2023    4:17 PM 01/03/2023    4:27 PM 10/28/2022    9:34 PM  Weight /BMI  Weight 205 lb 1.9 oz 214 lb 219 lb  Height 5\' 5"  (1.651 m) 5\' 5"  (1.651 m) 5\' 5"  (1.651 m)  BMI 34.13 kg/m2 35.61 kg/m2 36.44 kg/m2    Improving, however having significant nausea mauy need top d/c mounjaro

## 2023-06-22 NOTE — Assessment & Plan Note (Signed)
Persistent , uncontrolled needs GI re eval, recnet [place] gall bladder negative for gallstiones

## 2023-06-22 NOTE — Assessment & Plan Note (Signed)
,  After obtaining informed consent, the  influenza vaccine is  administered , with no adverse effect noted at the time of administration.

## 2023-06-22 NOTE — Progress Notes (Addendum)
Glenda Thompson     MRN: [id number]      DOB: 02-22-1971  Chief Complaint  Patient presents with   Nausea    Having indigestion    HPI Glenda Thompson is here for follow up and re-evaluation of chronic medical conditions, medication management and review of any available recent lab and radiology data.  Preventive health is updated, specifically  Cancer screening and Immunization.   Questions or concerns regarding consultations or procedures which the PT has had in the interim are  addressed. The PT denies any adverse reactions to current medications since the last visit.  C/o indigestion and recurrent nausea Denies polyuria, polydipsia, blurred vision , or hypoglycemic episodes.   ROS Denies recent fever or chills. Denies sinus pressure, nasal congestion, ear pain or sore throat. Denies chest congestion, productive cough or wheezing. Denies chest pains, palpitations and leg swelling .   Denies dysuria, frequency, hesitancy or incontinence. Denies joint pain, swelling and limitation in mobility. Denies headaches, seizures, numbness, or tingling. Denies depression, anxiety or insomnia. Denies skin break down or rash.   PE  BP 130/79 (BP Location: Right Arm, Patient Position: Sitting, Cuff Size: Large)   Pulse 81   Ht 5\' 5"  (1.651 m)   Wt 205 lb 1.9 oz (93 kg)   LMP 11/02/2017   SpO2 96%   BMI 34.13 kg/m   Patient alert and oriented and in no cardiopulmonary distress.  HEENT: No facial asymmetry, EOMI,     Neck supple .  Chest: Clear to auscultation bilaterally.  CVS: S1, S2 no murmurs, no S3.Regular rate.  ABD: Soft non tender.   Ext: No edema  MS: Adequate ROM spine, shoulders, hips and knees.  Skin: Intact, no ulcerations or rash noted.  Psych: Good eye contact, normal affect. Memory intact not anxious or depressed appearing.  CNS: CN 2-12 intact, power,  normal throughout.no focal deficits noted.   Assessment & Plan Essential hypertension,  benign Controlled, no change in medication DASH diet and commitment to daily physical activity for a minimum of 30 minutes discussed and encouraged, as a part of hypertension management. The importance of attaining a healthy weight is also discussed.     06/20/2023    4:17 PM 01/03/2023    4:27 PM 10/29/2022    1:06 AM 10/29/2022   12:00 AM 10/28/2022   11:45 PM 10/28/2022    9:36 PM 10/28/2022    9:34 PM  BP/Weight  Systolic BP 130 119 112 121 123 123   Diastolic BP 79 74 54 57 57 54   Wt. (Lbs) 205.12 214     219  BMI 34.13 kg/m2 35.61 kg/m2     36.44 kg/m2       Nausea Persistent , uncontrolled needs GI re eval, recnet [place] gall bladder negative for gallstiones  Mixed hyperlipidemia Hyperlipidemia:Low fat diet discussed and encouraged.   Lipid Panel  Lab Results  Component Value Date   CHOL 172 12/30/2022   HDL 60 12/30/2022   LDLCALC 97 12/30/2022   TRIG 82 12/30/2022   CHOLHDL 2.9 12/30/2022     Controlled, no change in medication   Morbid obesity  Patient re-educated about  the importance of commitment to a  minimum of 150 minutes of exercise per week as able.  The importance of healthy food choices with portion control discussed, as well as eating regularly and within a 12 hour window most days. The need to choose "clean , green" food 50 to 75% of the  time is discussed, as well as to make water the primary drink and set a goal of 64 ounces water daily.       06/20/2023    4:17 PM 01/03/2023    4:27 PM 10/28/2022    9:34 PM  Weight /BMI  Weight 205 lb 1.9 oz 214 lb 219 lb  Height 5\' 5"  (1.651 m) 5\' 5"  (1.651 m) 5\' 5"  (1.651 m)  BMI 34.13 kg/m2 35.61 kg/m2 36.44 kg/m2    Improving, however having significant nausea mauy need top d/c mounjaro  Type 2 diabetes mellitus with vascular disease (HCC) Glenda Thompson is reminded of the importance of commitment to daily physical activity for 30 minutes or more, as able and the need to limit carbohydrate intake to 30 to 60  grams per meal to help with blood sugar control.   The need to take medication as prescribed, test blood sugar as directed, and to call between visits if there is a concern that blood sugar is uncontrolled is also discussed.   Glenda Thompson is reminded of the importance of daily foot exam, annual eye examination, and good blood sugar, blood pressure and cholesterol control.     Latest Ref Rng & Units 01/03/2023    5:00 PM 12/30/2022    8:45 AM 10/28/2022   10:54 PM 09/21/2022    9:20 AM 06/10/2022    8:34 AM  Diabetic Labs  HbA1c 4.8 - 5.6 %  6.2   6.3  7.7   Micro/Creat Ratio 0 - 29 mg/g creat <3       Chol 100 - 199 mg/dL  782   956  213   HDL >08 mg/dL  60   53  73   Calc LDL 0 - 99 mg/dL  97   66  89   Triglycerides 0 - 149 mg/dL  82   77  97   Creatinine 0.57 - 1.00 mg/dL  6.57  8.46  9.62  9.52       06/20/2023    4:17 PM 01/03/2023    4:27 PM 10/29/2022    1:06 AM 10/29/2022   12:00 AM 10/28/2022   11:45 PM 10/28/2022    9:36 PM 10/28/2022    9:34 PM  BP/Weight  Systolic BP 130 119 112 121 123 123   Diastolic BP 79 74 54 57 57 54   Wt. (Lbs) 205.12 214     219  BMI 34.13 kg/m2 35.61 kg/m2     36.44 kg/m2      Latest Ref Rng & Units 03/18/2022    4:00 PM 03/03/2022   12:00 AM  Foot/eye exam completion dates  Eye Exam No Retinopathy  No Retinopathy      Foot Form Completion  Done      This result is from an external source.   Currently well controlled , however due to severe nausea , may need to d/c mounjaro, trying  to contact pt esp due to tele message received yesterday, unable to speak with her so far     Immunization due ,After obtaining informed consent, the  influenza vaccine is  administered , with no adverse effect noted at the time of administration.

## 2023-06-22 NOTE — Telephone Encounter (Signed)
Referral enetered as urgent , pt to stop mounjaro,needs cmp and EGr , amylase and lipase, cbc and diff as stat this am

## 2023-06-23 ENCOUNTER — Other Ambulatory Visit: Payer: Self-pay

## 2023-06-23 DIAGNOSIS — R110 Nausea: Secondary | ICD-10-CM

## 2023-06-23 DIAGNOSIS — R109 Unspecified abdominal pain: Secondary | ICD-10-CM

## 2023-06-23 NOTE — Telephone Encounter (Signed)
Lvm letting patient know. 

## 2023-06-27 ENCOUNTER — Ambulatory Visit (INDEPENDENT_AMBULATORY_CARE_PROVIDER_SITE_OTHER): Payer: BC Managed Care – PPO | Admitting: Gastroenterology

## 2023-06-27 ENCOUNTER — Encounter (INDEPENDENT_AMBULATORY_CARE_PROVIDER_SITE_OTHER): Payer: Self-pay | Admitting: Gastroenterology

## 2023-06-27 ENCOUNTER — Encounter (INDEPENDENT_AMBULATORY_CARE_PROVIDER_SITE_OTHER): Payer: BC Managed Care – PPO | Admitting: Gastroenterology

## 2023-06-27 VITALS — BP 150/69 | HR 75 | Temp 98.0°F | Ht 65.0 in | Wt 209.3 lb

## 2023-06-27 DIAGNOSIS — E1159 Type 2 diabetes mellitus with other circulatory complications: Secondary | ICD-10-CM | POA: Diagnosis not present

## 2023-06-27 DIAGNOSIS — K5904 Chronic idiopathic constipation: Secondary | ICD-10-CM | POA: Diagnosis not present

## 2023-06-27 DIAGNOSIS — K5289 Other specified noninfective gastroenteritis and colitis: Secondary | ICD-10-CM

## 2023-06-27 DIAGNOSIS — R109 Unspecified abdominal pain: Secondary | ICD-10-CM | POA: Diagnosis not present

## 2023-06-27 DIAGNOSIS — K219 Gastro-esophageal reflux disease without esophagitis: Secondary | ICD-10-CM | POA: Insufficient documentation

## 2023-06-27 DIAGNOSIS — Z6834 Body mass index (BMI) 34.0-34.9, adult: Secondary | ICD-10-CM

## 2023-06-27 DIAGNOSIS — E782 Mixed hyperlipidemia: Secondary | ICD-10-CM | POA: Diagnosis not present

## 2023-06-27 MED ORDER — POLYETHYLENE GLYCOL 3350 17 G PO PACK
17.0000 g | PACK | Freq: Two times a day (BID) | ORAL | 0 refills | Status: AC
Start: 2023-06-27 — End: 2023-09-25

## 2023-06-27 MED ORDER — PSYLLIUM 58.6 % PO PACK
1.0000 | PACK | Freq: Two times a day (BID) | ORAL | 2 refills | Status: AC
Start: 2023-06-27 — End: 2023-09-25

## 2023-06-27 NOTE — Progress Notes (Signed)
[name] , M.D. Gastroenterology & Hepatology [facility] [address] [place], [place] [address] Primary Care Physician: Kerri Perches, MD [address] [place] [address]  Chief Complaint: Chronic constipation, abdominal pain with history of colitis, GERD   History of Present Illness: Glenda Thompson is a 52 y.o. female with DM, HLD, HTN, obesity  who presents for evaluation of Chronic constipation, abdominal pain with history of colitis and GERD   Patient reports recently she has severe epigastric pain mostly after eating fatty or spicy.  Patient today presented in the clinic with diffuse abdominal pain which she reports is severe and states this is because she has not had any food since last night due to having blood work done today.  He does denies any blood in the stool but has worsening abdominal pain which is now diffuse.  Patient reports Bristol stool 1-2 bowel movement and says she will be lucky if she has identification twice a month.  Patient does feel nauseous at times especially with spicy and fatty food The patient denies having any fever, chills, hematochezia, melena, hematemesis, , diarrhea, jaundice, pruritus or weight loss.  Last UJW:JXBJ Last Colonoscopy:2020  - Altered vascular, congested, erythematous and ulcerated mucosa from 50 to 70 cm proximal to the anus. Possible ischemic colitis. Biopsied. - The distal rectum and anal verge are normal on retroflexion view.  Was given  ciprofloxacin an Flagyl and ciprofloxacin for 7 days  Recs: Pathology showed presence of changes of colitis with ulceration, likely related to ischemia, no cytopathic changes seen, repeat colonoscopy in 10 years.   Patient was prescribed antibiotic, left a voice message stating that she needs to call back if she has any recurrent abdominal pain or blood in her stool as we may need to do further imaging with a CT  angio.  Lab work from 12/2022 with normal liver enzymes hemoglobin 11.2 previous hemoglobin 12.9 in 02/2022  .FHx: neg for any gastrointestinal/liver disease, no malignancies Social: neg smoking, alcohol or illicit drug use Surgical: no abdominal surgeries  Past Medical History: Past Medical History:  Diagnosis Date   Arthritis    Diabetes mellitus    Diabetes mellitus without complication (HCC)    Phreesia 01/19/2021   Hyperlipidemia    Hypertension     Past Surgical History: Past Surgical History:  Procedure Laterality Date   BIOPSY  01/22/2021   Procedure: BIOPSY;  Surgeon: Dolores Frame, MD;  [facility]: [facility];  Service: Gastroenterology;;   CESAREAN SECTION     COLONOSCOPY  01/22/2021   COLONOSCOPY WITH PROPOFOL N/A 01/22/2021   Procedure: COLONOSCOPY WITH PROPOFOL;  Surgeon: Dolores Frame, MD;  Location: [facility];  Service: Gastroenterology;  Laterality: N/A;  Am    Family History: Family History  Problem Relation Age of Onset   Hypertension Mother    Diabetes Mother    Hyperlipidemia Mother    Diabetes Brother     Social History: Social History   Tobacco Use  Smoking Status Never  Smokeless Tobacco Never   Social History   Substance and Sexual Activity  Alcohol Use Never   Social History   Substance and Sexual Activity  Drug Use Never    Allergies: No Known Allergies  Medications: Current Outpatient Medications  Medication Sig Dispense Refill   amLODipine (NORVASC) 10 MG tablet TAKE 1 TABLET(10 MG) BY MOUTH DAILY 90 tablet 1   Continuous Blood Gluc Receiver (FREESTYLE LIBRE  2 READER) DEVI To check glucose dx e11.9 1 each 0   Continuous Blood Gluc Sensor (FREESTYLE LIBRE 2 SENSOR) MISC TO CHECK GLUCOSE DX E11.9 2 each 5   glipiZIDE (GLUCOTROL XL) 5 MG 24 hr tablet TAKE 1 TABLET BY MOUTH EVERY DAY WITH BREAKFAST 90 tablet 1   Insulin Pen Needle (B-D ULTRAFINE III SHORT PEN) 31G X 8 MM MISC Use as directed to inject  insulin daily. 100 each 2   lisinopril-hydrochlorothiazide (ZESTORETIC) 20-12.5 MG tablet TAKE 2 TABLETS BY MOUTH DAILY 180 tablet 1   metaxalone (SKELAXIN) 800 MG tablet Take 800 mg by mouth 2 (two) times daily as needed.     nabumetone (RELAFEN) 750 MG tablet Take 750 mg by mouth 2 (two) times daily.     polyethylene glycol (MIRALAX / GLYCOLAX) 17 g packet Take 17 g by mouth 2 (two) times daily. 180 packet 0   psyllium (METAMUCIL) 58.6 % packet Take 1 packet by mouth 2 (two) times daily. 60 packet 2   rosuvastatin (CRESTOR) 5 MG tablet Take 1 tablet (5 mg total) by mouth daily. 90 tablet 1   tirzepatide (MOUNJARO) 5 MG/0.5ML Pen INJECT 5 MG SUBCUTANEOUSLY WEEKLY 2 mL 1   ondansetron (ZOFRAN-ODT) 4 MG disintegrating tablet Take 1 tablet (4 mg total) by mouth every 8 (eight) hours as needed for nausea or vomiting. (Patient not taking: Reported on 06/27/2023) 20 tablet 0   No current facility-administered medications for this visit.    Review of Systems: GENERAL: negative for malaise, night sweats HEENT: No changes in hearing or vision, no nose bleeds or other nasal problems. NECK: Negative for lumps, goiter, pain and significant neck swelling RESPIRATORY: Negative for cough, wheezing CARDIOVASCULAR: Negative for chest pain, leg swelling, palpitations, orthopnea GI: SEE HPI MUSCULOSKELETAL: Negative for joint pain or swelling, back pain, and muscle pain. SKIN: Negative for lesions, rash HEMATOLOGY Negative for prolonged bleeding, bruising easily, and swollen nodes. ENDOCRINE: Negative for cold or heat intolerance, polyuria, polydipsia and goiter. NEURO: negative for tremor, gait imbalance, syncope and seizures. The remainder of the review of systems is noncontributory.   Physical Exam: BP (!) 150/69   Pulse 75   Temp 98 F (36.7 C) (Oral)   Ht 5\' 5"  (1.651 m)   Wt 209 lb 4.8 oz (94.9 kg)   LMP 11/02/2017   BMI 34.83 kg/m  GENERAL: The patient is AO x3, in no acute distress. HEENT:  Head is normocephalic and atraumatic. EOMI are intact. Mouth is well hydrated and without lesions. NECK: Supple. No masses LUNGS: Clear to auscultation. No presence of rhonchi/wheezing/rales. Adequate chest expansion HEART: RRR, normal s1 and s2. ABDOMEN: Soft, Epigastric tenderness and lower abdomen tendenerss , no guarding, no peritoneal signs, and nondistended. BS +. No masses. EXTREMITIES: Without any cyanosis, clubbing, rash, lesions or edema. NEUROLOGIC: AOx3, no focal motor deficit. SKIN: no jaundice, no rashes   Imaging/Labs: as above     Latest Ref Rng & Units 10/28/2022   10:54 PM 03/06/2022    9:08 PM 03/06/2022    9:00 PM  CBC  WBC 4.0 - 10.5 K/uL 9.3   13.0   Hemoglobin 12.0 - 15.0 g/dL 54.2  70.6  23.7   Hematocrit 36.0 - 46.0 % 35.3  38.0  35.9   Platelets 150 - 400 K/uL 289   274    Lab Results  Component Value Date   IRON 73 06/14/2023   FERRITIN 232 (H) 06/14/2023    I personally reviewed and interpreted the  available labs, imaging and endoscopic files.  12/2022 1. Normal gallbladder. 2. Diffuse increased echotexture of the liver. This is a nonspecific finding but can be seen in fatty infiltration of the liver.  CT Angio abdomen pelvis 02/2022  No evidence of thoracoabdominal aortic aneurysm or dissection.   No evidence of central pulmonary embolism.   No acute findings in the chest, abdomen, or pelvis. '  Impression and Plan: Glenda Thompson is a 52 y.o. female with DM, HLD, HTN, obesity  who presents for evaluation of Chronic constipation, abdominal pain with history of colitis and GERD   #Colitis #Anemia  Patient had colonoscopy in 2020 where there was significant inflammation encountered on the antimesenteric side radiological ischemic colitis 50 to 70 cm proximal to the anus. Path with Severe acute nonspecific colitis with ulceration.  Patient with significant lower abdomen tenderness and reports of intermittent diffuse abdominal pain even in  the clinic today  Lab work from 12/2022 with normal liver enzymes hemoglobin 11.2 previous hemoglobin 12.9 in 02/2022  Appears patient had a CT angio chest abdomen pelvis without any or vascular compromise.  Although to  evaluate for ischemic colitis dedicated CT angio abdomen is recommended  With continued symptoms,  history of endoscopic finding of colitis and new anemia since past 3 years will proceed with colonoscopy with TI intubation to ensure patient does not have inflammatory bowel disease   #Abdominal pain  Patient has mostly epigastric abdominal pain worsened with fatty foods liver food this could be related to GERD or peptic ulcer disease.  Recent ultrasound with normal gallbladder without choledocholithiasis  Exam today significant with epigastric tenderness  We will proceed with upper endoscopy to evaluate for any lesions such as peptic ulcer disease given severity of symptoms  #Severe Constipation  Patient abdominal pain can also be explained by severe constipation as patient reports Bristol stool 1-2 bowel movement twice a month  Patient did try previously MiraLAX and Metamucil without much benefit.  Recs:  Ensure adequate fluid intake: Aim for 8 glasses of water daily. Follow a high fiber diet: Include foods such as dates, prunes, pears, and kiwi. Take Miralax twice a day for the first week, then reduce to once daily thereafter. Use Metamucil twice a day. After above has tried and failed again we will evaluate patient The visit for Linzess 145 mcg  #GERD Patient has reflux worsened with certain trigger food.  This is exacerbated with BMI of 34 and with truncal obesity  1) Avoid coffee, tea, cola beverages, carbonated beverages, spicy foods, greasy foods, foods high in acid content (e.g. tomatoes and citrus fruits), chocolate, and peppermint 2) Avoid drinking alcoholic beverages 3) Avoid smoking 4) Eat small meals and keep weight within normal range 5) Avoid  recumbent posture for 3 hours post-prandially 6) Elevate head of bed   #BMI : 34       - walking at a brisk pace/biking at moderate intesity 2.5-5 hours per week     - use pedometer/step counter to track activity     - goal to lose 5-10% of initial body weight     - avoid suagry drinks and juices, use zero calorie beverages     - increase water intake     - eat a low carb diet with plenty of veggies and fruit     - Get sufficient sleep 7-8 hrs nightly     - maitain active lifestyle     - avoid alcohol     -  recommend 2-3 cups Coffee daily     - Counsel on lowering cholesterol by having a diet rich in vegetables,          protein (avoid red meats) and good fats(fish, salmon   #Hypertension The patient was found to have elevated blood pressure when vital signs were checked in the office. The blood pressure was rechecked by the nursing staff and it was found be persistently elevated >140/90 mmHg. I personally advised to the patient to follow up closely with PCP for hypertension control.   All questions were answered.      [name], MD Gastroenterology and Hepatology [facility]   This chart has been completed using [facility] Dictation software, and while attempts have been made to ensure accuracy , certain words and phrases may not be transcribed as intended

## 2023-06-27 NOTE — Patient Instructions (Signed)
It was very nice to meet you today, as dicussed with will plan for the following :  1)  Ensure adequate fluid intake: Aim for 8 glasses of water daily. Follow a high fiber diet: Include foods such as dates, prunes, pears, and kiwi. Take Miralax twice a day for the first week, then reduce to once daily thereafter. Use Metamucil twice a day.   2) Upper endoscopy and constipation

## 2023-06-28 LAB — CBC WITH DIFFERENTIAL/PLATELET
Basophils Absolute: 0 10*3/uL (ref 0.0–0.2)
Basos: 1 %
EOS (ABSOLUTE): 0.1 10*3/uL (ref 0.0–0.4)
Eos: 2 %
Hematocrit: 37.2 % (ref 34.0–46.6)
Hemoglobin: 12.2 g/dL (ref 11.1–15.9)
Immature Grans (Abs): 0 10*3/uL (ref 0.0–0.1)
Immature Granulocytes: 0 %
Lymphocytes Absolute: 2.7 10*3/uL (ref 0.7–3.1)
Lymphs: 37 %
MCH: 28 pg (ref 26.6–33.0)
MCHC: 32.8 g/dL (ref 31.5–35.7)
MCV: 86 fL (ref 79–97)
Monocytes Absolute: 0.3 10*3/uL (ref 0.1–0.9)
Monocytes: 4 %
Neutrophils Absolute: 4.1 10*3/uL (ref 1.4–7.0)
Neutrophils: 56 %
Platelets: 265 10*3/uL (ref 150–450)
RBC: 4.35 x10E6/uL (ref 3.77–5.28)
RDW: 12.1 % (ref 11.7–15.4)
WBC: 7.3 10*3/uL (ref 3.4–10.8)

## 2023-06-28 LAB — CBC
Hematocrit: 36.6 % (ref 34.0–46.6)
Hemoglobin: 12 g/dL (ref 11.1–15.9)
MCH: 27.6 pg (ref 26.6–33.0)
MCHC: 32.8 g/dL (ref 31.5–35.7)
MCV: 84 fL (ref 79–97)
Platelets: 251 10*3/uL (ref 150–450)
RBC: 4.35 x10E6/uL (ref 3.77–5.28)
RDW: 12 % (ref 11.7–15.4)
WBC: 7.2 10*3/uL (ref 3.4–10.8)

## 2023-06-28 LAB — CMP14+EGFR
ALT: 12 IU/L (ref 0–32)
ALT: 16 IU/L (ref 0–32)
AST: 18 IU/L (ref 0–40)
AST: 20 IU/L (ref 0–40)
Albumin: 4.2 g/dL (ref 3.8–4.9)
Albumin: 4.2 g/dL (ref 3.8–4.9)
Alkaline Phosphatase: 57 IU/L (ref 44–121)
Alkaline Phosphatase: 59 IU/L (ref 44–121)
BUN/Creatinine Ratio: 21 (ref 9–23)
BUN/Creatinine Ratio: 21 (ref 9–23)
BUN: 21 mg/dL (ref 6–24)
BUN: 22 mg/dL (ref 6–24)
Bilirubin Total: 0.2 mg/dL (ref 0.0–1.2)
Bilirubin Total: 0.3 mg/dL (ref 0.0–1.2)
CO2: 22 mmol/L (ref 20–29)
CO2: 24 mmol/L (ref 20–29)
Calcium: 9.6 mg/dL (ref 8.7–10.2)
Calcium: 9.6 mg/dL (ref 8.7–10.2)
Chloride: 104 mmol/L (ref 96–106)
Chloride: 105 mmol/L (ref 96–106)
Creatinine, Ser: 0.99 mg/dL (ref 0.57–1.00)
Creatinine, Ser: 1.06 mg/dL — ABNORMAL HIGH (ref 0.57–1.00)
Globulin, Total: 2.2 g/dL (ref 1.5–4.5)
Globulin, Total: 2.6 g/dL (ref 1.5–4.5)
Glucose: 83 mg/dL (ref 70–99)
Glucose: 84 mg/dL (ref 70–99)
Potassium: 4.6 mmol/L (ref 3.5–5.2)
Potassium: 4.6 mmol/L (ref 3.5–5.2)
Sodium: 141 mmol/L (ref 134–144)
Sodium: 141 mmol/L (ref 134–144)
Total Protein: 6.4 g/dL (ref 6.0–8.5)
Total Protein: 6.8 g/dL (ref 6.0–8.5)
eGFR: 63 mL/min/{1.73_m2} (ref >59)
eGFR: 69 mL/min/{1.73_m2} (ref >59)

## 2023-06-28 LAB — HEMOGLOBIN A1C
Est. average glucose Bld gHb Est-mCnc: 126 mg/dL
Hgb A1c MFr Bld: 6 % — ABNORMAL HIGH (ref 4.8–5.6)

## 2023-06-28 LAB — LIPID PANEL
Chol/HDL Ratio: 3 ratio (ref 0.0–4.4)
Cholesterol, Total: 178 mg/dL (ref 100–199)
HDL: 60 mg/dL (ref >39)
LDL Chol Calc (NIH): 101 mg/dL — ABNORMAL HIGH (ref 0–99)
Triglycerides: 93 mg/dL (ref 0–149)
VLDL Cholesterol Cal: 17 mg/dL (ref 5–40)

## 2023-06-28 LAB — TSH: TSH: 1.27 u[IU]/mL (ref 0.450–4.500)

## 2023-06-28 LAB — LIPASE: Lipase: 24 U/L (ref 14–72)

## 2023-06-28 LAB — AMYLASE: Amylase: 91 U/L (ref 31–110)

## 2023-07-05 ENCOUNTER — Other Ambulatory Visit: Payer: Self-pay | Admitting: Family Medicine

## 2023-07-06 ENCOUNTER — Ambulatory Visit: Payer: Self-pay | Admitting: *Deleted

## 2023-07-06 NOTE — Patient Outreach (Signed)
  Care Coordination   Follow Up Visit Note   01/26/2024 updated note for 07/06/23 Name: Glenda Thompson MRN: [id number] DOB: 06-Apr-1971  Glenda Thompson is a 52 y.o. year old female who sees Glenda Perches, MD for primary care. I spoke with  Glenda Thompson by phone today.  What matters to the patients health and wellness today?  Abdominal pain with syncope x 3   She had constipation Ensure adequate fluid intake: Aim for 8 glasses of water daily. Follow a high fiber diet: Include foods such as dates, prunes, pears, and kiwi. Take Miralax twice a day for the first week, then reduce to once daily thereafter. Use Metamucil twice a day.    Goals Addressed               This Visit's Progress     Patient Stated     Prevent syncope (vasovagal) episodes, Expedite GI referral, manage diabetes with use of Mounjaro((RN CM care coordination services) (pt-stated)   Not on track     Care Coordination Interventions: Interventions Today    Flowsheet Row Most Recent Value  Chronic Disease   Chronic disease during today's visit Other, Diabetes  [abdominal pain, syncope x 3 constipation]  General Interventions   General Interventions Discussed/Reviewed General Interventions Reviewed, Sick Day Rules, Doctor Visits  Doctor Visits Discussed/Reviewed Doctor Visits Reviewed, PCP, Specialist  PCP/Specialist Visits Compliance with follow-up visit  Exercise Interventions   Exercise Discussed/Reviewed Exercise Reviewed, Physical Activity  [continues to be active]  Education Interventions   Education Provided Provided Education  [syncope, constipation, valsalva manuever, home treatment for constipation]  Provided Verbal Education On Nutrition, Exercise, Medication, When to see the doctor, Other  Mental Health Interventions   Mental Health Discussed/Reviewed Mental Health Reviewed, Coping Strategies  Nutrition Interventions   Nutrition Discussed/Reviewed Nutrition Reviewed, Adding fruits and  vegetables, Fluid intake  Glenda Thompson fiber, miralax/metamucil]  Pharmacy Interventions   Pharmacy Dicussed/Reviewed Pharmacy Topics Reviewed, Medications and their functions, Affording Medications  Advanced Directive Interventions   Advanced Directives Discussed/Reviewed Advanced Directives Discussed  [encouraged but not preferring]              SDOH assessments and interventions completed:  No     Care Coordination Interventions:  Yes, provided   Follow up plan: No further intervention required.   Encounter Outcome:  Patient Visit Completed   Glenda Bradford L. Noelle Penner, RN, BSN, CCM [facility] RN Care Manager [phone]

## 2023-08-22 ENCOUNTER — Telehealth (INDEPENDENT_AMBULATORY_CARE_PROVIDER_SITE_OTHER): Payer: Self-pay | Admitting: Gastroenterology

## 2023-08-22 NOTE — Telephone Encounter (Signed)
[facility] to schedule EGD/TCS

## 2023-08-24 NOTE — Telephone Encounter (Signed)
Pt left voicemail returning call. Returned call but had to leave message.

## 2023-08-28 MED ORDER — PEG 3350-KCL-NA BICARB-NACL 420 G PO SOLR: 4000.0000 mL | Freq: Once | ORAL | 0 refills | Status: AC

## 2023-08-28 NOTE — Telephone Encounter (Signed)
Pt contacted and scheduled for 09/07/23. Instructions sent via my chart. Pre op sent via my chart. Prep sent to pharmacy

## 2023-08-28 NOTE — Telephone Encounter (Signed)
No PA needed via [employer]

## 2023-08-28 NOTE — Addendum Note (Signed)
Addended by: Marlowe Shores on: 08/28/2023 08:04 AM   Modules accepted: Orders

## 2023-08-31 NOTE — Patient Instructions (Addendum)
Glenda Thompson  08/31/2023     @PREFPERIOPPHARMACY @   Your procedure is scheduled on  09/07/2023.   Report to [facility] at  1115  A.M.   Call this number if you have problems the morning of surgery:  [phone]  If you experience any cold or flu symptoms such as cough, fever, chills, shortness of breath, etc. between now and your scheduled surgery, please notify us at the above number.   Remember:     Your last dose of mounjaro should have been on 08/30/2023.  DO NOT take any medications for diabetes the morning of your procedure.    Follow the diet and prep instructions given to you by the office.    You may drink clear liquids until  0915 on 09/07/2023.    Clear liquids allowed are:                    Water, Juice (No red color; non-citric and without pulp; diabetics please choose diet or no sugar options), Carbonated beverages (diabetics please choose diet or no sugar options), Clear Tea (No creamer, milk, or cream, including half & half and powdered creamer), Black Coffee Only (No creamer, milk or cream, including half & half and powdered creamer), and Clear Sports drink (No red color; diabetics please choose diet or no sugar options)    Take these medicines the morning of surgery with A SIP OF WATER          amlodipine, metaxalone, nabumetone, ondansetron (if needed).     Do not wear jewelry, make-up or nail polish, including gel polish,  artificial nails, or any other type of covering on natural nails (fingers and  toes).  Do not wear lotions, powders, or perfumes, or deodorant.  Do not shave 48 hours prior to surgery.  Men may shave face and neck.  Do not bring valuables to the hospital.  [facility] is not responsible for any belongings or valuables.  Contacts, dentures or bridgework may not be worn into surgery.  Leave your suitcase in the car.  After surgery it may be brought to your room.  For patients admitted to the hospital, discharge time  will be determined by your treatment team.  Patients discharged the day of surgery will not be allowed to drive home and must have someone with them for 24 hours.    Special instructions:   DO NOT smoke tobacco or vape for 24 hors before your procedure.  Please read over the following fact sheets that you were given. Anesthesia Post-op Instructions and Care and Recovery After Surgery        Upper Endoscopy, Adult, Care After After the procedure, it is common to have a sore throat. It is also common to have: Mild stomach pain or discomfort. Bloating. Nausea. Follow these instructions at home: The instructions below may help you care for yourself at home. Your health care provider may give you more instructions. If you have questions, ask your health care provider. If you were given a sedative during the procedure, it can affect you for several hours. Do not drive or operate machinery until your health care provider says that it is safe. If you will be going home right after the procedure, plan to have a responsible adult: Take you home from the hospital or clinic. You will not be allowed to drive. Care for you for the time you are told. Follow instructions from your health  care provider about what you may eat and drink. Return to your normal activities as told by your health care provider. Ask your health care provider what activities are safe for you. Take over-the-counter and prescription medicines only as told by your health care provider. Contact a health care provider if you: Have a sore throat that lasts longer than one day. Have trouble swallowing. Have a fever. Get help right away if you: Vomit blood or your vomit looks like coffee grounds. Have bloody, black, or tarry stools. Have a very bad sore throat or you cannot swallow. Have difficulty breathing or very bad pain in your chest or abdomen. These symptoms may be an emergency. Get help right away. Call 911. Do not wait  to see if the symptoms will go away. Do not drive yourself to the hospital. Summary After the procedure, it is common to have a sore throat, mild stomach discomfort, bloating, and nausea. If you were given a sedative during the procedure, it can affect you for several hours. Do not drive until your health care provider says that it is safe. Follow instructions from your health care provider about what you may eat and drink. Return to your normal activities as told by your health care provider. This information is not intended to replace advice given to you by your health care provider. Make sure you discuss any questions you have with your health care provider. Document Revised: 01/19/2022 Document Reviewed: 01/19/2022 Elsevier Patient Education  2024 Elsevier Inc. Colonoscopy, Adult, Care After The following information offers guidance on how to care for yourself after your procedure. Your health care provider may also give you more specific instructions. If you have problems or questions, contact your health care provider. What can I expect after the procedure? After the procedure, it is common to have: A small amount of blood in your stool for 24 hours after the procedure. Some gas. Mild cramping or bloating of your abdomen. Follow these instructions at home: Eating and drinking  Drink enough fluid to keep your urine pale yellow. Follow instructions from your health care provider about eating or drinking restrictions. Resume your normal diet as told by your health care provider. Avoid heavy or fried foods that are hard to digest. Activity Rest as told by your health care provider. Avoid sitting for a long time without moving. Get up to take short walks every 1-2 hours. This is important to improve blood flow and breathing. Ask for help if you feel weak or unsteady. Return to your normal activities as told by your health care provider. Ask your health care provider what activities are  safe for you. Managing cramping and bloating  Try walking around when you have cramps or feel bloated. If directed, apply heat to your abdomen as told by your health care provider. Use the heat source that your health care provider recommends, such as a moist heat pack or a heating pad. Place a towel between your skin and the heat source. Leave the heat on for 20-30 minutes. Remove the heat if your skin turns bright red. This is especially important if you are unable to feel pain, heat, or cold. You have a greater risk of getting burned. General instructions If you were given a sedative during the procedure, it can affect you for several hours. Do not drive or operate machinery until your health care provider says that it is safe. For the first 24 hours after the procedure: Do not sign important documents. Do not  drink alcohol. Do your regular daily activities at a slower pace than normal. Eat soft foods that are easy to digest. Take over-the-counter and prescription medicines only as told by your health care provider. Keep all follow-up visits. This is important. Contact a health care provider if: You have blood in your stool 2-3 days after the procedure. Get help right away if: You have more than a small spotting of blood in your stool. You have large blood clots in your stool. You have swelling of your abdomen. You have nausea or vomiting. You have a fever. You have increasing pain in your abdomen that is not relieved with medicine. These symptoms may be an emergency. Get help right away. Call 911. Do not wait to see if the symptoms will go away. Do not drive yourself to the hospital. Summary After the procedure, it is common to have a small amount of blood in your stool. You may also have mild cramping and bloating of your abdomen. If you were given a sedative during the procedure, it can affect you for several hours. Do not drive or operate machinery until your health care  provider says that it is safe. Get help right away if you have a lot of blood in your stool, nausea or vomiting, a fever, or increased pain in your abdomen. This information is not intended to replace advice given to you by your health care provider. Make sure you discuss any questions you have with your health care provider. Document Revised: 11/22/2022 Document Reviewed: 06/02/2021 Elsevier Patient Education  2024 Elsevier Inc. Monitored Anesthesia Care, Care After The following information offers guidance on how to care for yourself after your procedure. Your health care provider may also give you more specific instructions. If you have problems or questions, contact your health care provider. What can I expect after the procedure? After the procedure, it is common to have: Tiredness. Little or no memory about what happened during or after the procedure. Impaired judgment when it comes to making decisions. Nausea or vomiting. Some trouble with balance. Follow these instructions at home: For the time period you were told by your health care provider:  Rest. Do not participate in activities where you could fall or become injured. Do not drive or use machinery. Do not drink alcohol. Do not take sleeping pills or medicines that cause drowsiness. Do not make important decisions or sign legal documents. Do not take care of children on your own. Medicines Take over-the-counter and prescription medicines only as told by your health care provider. If you were prescribed antibiotics, take them as told by your health care provider. Do not stop using the antibiotic even if you start to feel better. Eating and drinking Follow instructions from your health care provider about what you may eat and drink. Drink enough fluid to keep your urine pale yellow. If you vomit: Drink clear fluids slowly and in small amounts as you are able. Clear fluids include water, ice chips, low-calorie sports drinks,  and fruit juice that has water added to it (diluted fruit juice). Eat light and bland foods in small amounts as you are able. These foods include bananas, applesauce, rice, lean meats, toast, and crackers. General instructions  Have a responsible adult stay with you for the time you are told. It is important to have someone help care for you until you are awake and alert. If you have sleep apnea, surgery and some medicines can increase your risk for breathing problems. Follow instructions from  your health care provider about wearing your sleep device: When you are sleeping. This includes during daytime naps. While taking prescription pain medicines, sleeping medicines, or medicines that make you drowsy. Do not use any products that contain nicotine or tobacco. These products include cigarettes, chewing tobacco, and vaping devices, such as e-cigarettes. If you need help quitting, ask your health care provider. Contact a health care provider if: You feel nauseous or vomit every time you eat or drink. You feel light-headed. You are still sleepy or having trouble with balance after 24 hours. You get a rash. You have a fever. You have redness or swelling around the IV site. Get help right away if: You have trouble breathing. You have new confusion after you get home. These symptoms may be an emergency. Get help right away. Call 911. Do not wait to see if the symptoms will go away. Do not drive yourself to the hospital. This information is not intended to replace advice given to you by your health care provider. Make sure you discuss any questions you have with your health care provider. Document Revised: 03/07/2022 Document Reviewed: 03/07/2022 Elsevier Patient Education  2024 [employer].

## 2023-09-01 ENCOUNTER — Other Ambulatory Visit: Payer: Self-pay | Admitting: Family Medicine

## 2023-09-01 ENCOUNTER — Other Ambulatory Visit: Payer: Self-pay

## 2023-09-01 ENCOUNTER — Encounter (HOSPITAL_COMMUNITY)
Admission: RE | Admit: 2023-09-01 | Discharge: 2023-09-01 | Disposition: A | Discharge status: Home / Self Care | Payer: BC Managed Care – PPO | Source: Ambulatory Visit | Attending: Gastroenterology | Admitting: Gastroenterology

## 2023-09-01 ENCOUNTER — Telehealth: Payer: Self-pay | Admitting: Family Medicine

## 2023-09-01 ENCOUNTER — Encounter (HOSPITAL_COMMUNITY): Payer: Self-pay

## 2023-09-01 VITALS — BP 130/55 | HR 80 | Temp 97.8°F | Resp 18 | Ht 65.0 in | Wt 209.2 lb

## 2023-09-01 DIAGNOSIS — Z6834 Body mass index (BMI) 34.0-34.9, adult: Secondary | ICD-10-CM

## 2023-09-01 DIAGNOSIS — I10 Essential (primary) hypertension: Secondary | ICD-10-CM

## 2023-09-01 DIAGNOSIS — E1159 Type 2 diabetes mellitus with other circulatory complications: Secondary | ICD-10-CM

## 2023-09-01 DIAGNOSIS — Z01810 Encounter for preprocedural cardiovascular examination: Secondary | ICD-10-CM | POA: Insufficient documentation

## 2023-09-01 MED ORDER — MOUNJARO 5 MG/0.5ML ~~LOC~~ SOAJ: 5.0000 mg | SUBCUTANEOUS | 1 refills | Status: DC

## 2023-09-01 NOTE — Telephone Encounter (Signed)
[employer] [facility] [employer]   [phone]  Called in on patient behalf  Refill on  tirzepatide [facility]) 5 MG/0.5ML Pen [[id number]]

## 2023-09-01 NOTE — Telephone Encounter (Signed)
Refill sent.

## 2023-09-02 ENCOUNTER — Other Ambulatory Visit: Payer: Self-pay | Admitting: Family Medicine

## 2023-09-04 ENCOUNTER — Ambulatory Visit: Payer: Self-pay | Admitting: Family Medicine

## 2023-09-04 NOTE — Telephone Encounter (Signed)
Copied from CRM [id number]. Topic: Clinical - Prescription Issue >> Sep 04, 2023  4:32 PM Raven B wrote: Reason for CRM: Gildardo Pounds from pharmacy calling back to request PT Greggory Keen be changed to a 90 day supply.    Chief Complaint: Prescription must be 90 day supply due to prescription being a mail in.   Disposition: [] ED /[] Urgent Care (no appt availability in office) / [] Appointment(In office/virtual)/ []  [facility]/ [] Home Care/ [] Refused Recommended Disposition /[] [facility]/ [x]  Follow-up with PCP Additional Notes: Clinic Access Line, [facility] [place], Account Coordinator, no answer, took call back number for clinic.   Use Direct Pharmacy Number listed for verbal orders.   Reason for Disposition  [1] Pharmacy calling with prescription questions AND [2] triager unable to answer question  Answer Assessment - Initial Assessment Questions 1. DRUG NAME: "What medicine do you need to have refilled?"     Mounjaro 2. REFILLS REMAINING: "How many refills are remaining?" (Note: The label on the medicine or pill bottle will show how many refills are remaining. If there are no refills remaining, then a renewal may be needed.) None 4. PRESCRIBING HCP: "Who prescribed it?" Reason: If prescribed by specialist, call should be referred to that group.     Dr. Syliva Overman  Protocols used: Medication Refill and Renewal Call-A-AH

## 2023-09-05 ENCOUNTER — Telehealth: Payer: Self-pay | Admitting: Family Medicine

## 2023-09-05 ENCOUNTER — Other Ambulatory Visit: Payer: Self-pay

## 2023-09-05 MED ORDER — MOUNJARO 5 MG/0.5ML ~~LOC~~ SOAJ: 5.0000 mg | SUBCUTANEOUS | 1 refills | Status: DC

## 2023-09-05 NOTE — Telephone Encounter (Signed)
Done

## 2023-09-05 NOTE — Telephone Encounter (Signed)
Medication sent for 90 day supply.  

## 2023-09-05 NOTE — Telephone Encounter (Signed)
Copied from CRM [id number]. Topic: Clinical - Medication Refill >> Sep 05, 2023 10:27 AM Dollene Primrose wrote: Most Recent Primary Care Visit:  Provider: Kerri Perches  Department: [facility]  Visit Type: OFFICE VISIT  Date: 06/20/2023  Medication:  tirzepatide Greggory Keen) 5 MG/0.5ML Pen    Has the patient contacted their pharmacy? Yes-Daisy from pharmacy [phone] called, Patient needs RX written for 90 days supply for mail order. (Agent: If no, request that the patient contact the pharmacy for the refill. If patient does not wish to contact the pharmacy document the reason why and proceed with request.) (Agent: If yes, when and what did the pharmacy advise?)  Is this the correct pharmacy for this prescription? Yes If no, delete pharmacy and type the correct one.  This is the patient's preferred pharmacy:    [facility] and [address] [place], [place] - [address] [place] [address] Phone: [phone] Fax: [phone]   Has the prescription been filled recently? Yes  Is the patient out of the medication? Yes  Has the patient been seen for an appointment in the last year OR does the patient have an upcoming appointment? Yes  Can we respond through MyChart? No-please call pharmacy  Agent: Please be advised that Rx refills may take up to 3 business days. We ask that you follow-up with your pharmacy.

## 2023-09-07 ENCOUNTER — Ambulatory Visit (HOSPITAL_COMMUNITY): Payer: BC Managed Care – PPO | Admitting: Anesthesiology

## 2023-09-07 ENCOUNTER — Encounter (HOSPITAL_COMMUNITY)
Admission: RE | Disposition: A | Discharge status: Home / Self Care | Payer: Self-pay | Source: Home / Self Care | Attending: Gastroenterology

## 2023-09-07 ENCOUNTER — Encounter (HOSPITAL_COMMUNITY): Payer: Self-pay

## 2023-09-07 ENCOUNTER — Ambulatory Visit (HOSPITAL_COMMUNITY)
Admission: RE | Admit: 2023-09-07 | Discharge: 2023-09-07 | Disposition: A | Discharge status: Home / Self Care | Payer: BC Managed Care – PPO | Attending: Gastroenterology | Admitting: Gastroenterology

## 2023-09-07 DIAGNOSIS — I10 Essential (primary) hypertension: Secondary | ICD-10-CM | POA: Diagnosis not present

## 2023-09-07 DIAGNOSIS — D509 Iron deficiency anemia, unspecified: Secondary | ICD-10-CM | POA: Insufficient documentation

## 2023-09-07 DIAGNOSIS — K5730 Diverticulosis of large intestine without perforation or abscess without bleeding: Secondary | ICD-10-CM | POA: Diagnosis not present

## 2023-09-07 DIAGNOSIS — Z794 Long term (current) use of insulin: Secondary | ICD-10-CM | POA: Insufficient documentation

## 2023-09-07 DIAGNOSIS — K644 Residual hemorrhoidal skin tags: Secondary | ICD-10-CM | POA: Insufficient documentation

## 2023-09-07 DIAGNOSIS — K641 Second degree hemorrhoids: Secondary | ICD-10-CM

## 2023-09-07 DIAGNOSIS — E119 Type 2 diabetes mellitus without complications: Secondary | ICD-10-CM | POA: Insufficient documentation

## 2023-09-07 DIAGNOSIS — Z8719 Personal history of other diseases of the digestive system: Secondary | ICD-10-CM | POA: Diagnosis not present

## 2023-09-07 DIAGNOSIS — M1990 Unspecified osteoarthritis, unspecified site: Secondary | ICD-10-CM | POA: Insufficient documentation

## 2023-09-07 DIAGNOSIS — Z6834 Body mass index (BMI) 34.0-34.9, adult: Secondary | ICD-10-CM | POA: Diagnosis not present

## 2023-09-07 DIAGNOSIS — K3189 Other diseases of stomach and duodenum: Secondary | ICD-10-CM | POA: Insufficient documentation

## 2023-09-07 DIAGNOSIS — K2970 Gastritis, unspecified, without bleeding: Secondary | ICD-10-CM

## 2023-09-07 DIAGNOSIS — K648 Other hemorrhoids: Secondary | ICD-10-CM | POA: Insufficient documentation

## 2023-09-07 DIAGNOSIS — E785 Hyperlipidemia, unspecified: Secondary | ICD-10-CM | POA: Insufficient documentation

## 2023-09-07 DIAGNOSIS — K449 Diaphragmatic hernia without obstruction or gangrene: Secondary | ICD-10-CM | POA: Diagnosis not present

## 2023-09-07 DIAGNOSIS — Z79899 Other long term (current) drug therapy: Secondary | ICD-10-CM | POA: Insufficient documentation

## 2023-09-07 DIAGNOSIS — Z7984 Long term (current) use of oral hypoglycemic drugs: Secondary | ICD-10-CM | POA: Diagnosis not present

## 2023-09-07 DIAGNOSIS — Z7985 Long-term (current) use of injectable non-insulin antidiabetic drugs: Secondary | ICD-10-CM | POA: Diagnosis not present

## 2023-09-07 DIAGNOSIS — R1013 Epigastric pain: Secondary | ICD-10-CM | POA: Diagnosis not present

## 2023-09-07 DIAGNOSIS — E669 Obesity, unspecified: Secondary | ICD-10-CM | POA: Insufficient documentation

## 2023-09-07 DIAGNOSIS — K5909 Other constipation: Secondary | ICD-10-CM | POA: Diagnosis not present

## 2023-09-07 HISTORY — PX: BIOPSY: SHX5522

## 2023-09-07 HISTORY — PX: ESOPHAGOGASTRODUODENOSCOPY (EGD) WITH PROPOFOL: SHX5813

## 2023-09-07 HISTORY — PX: COLONOSCOPY WITH PROPOFOL: SHX5780

## 2023-09-07 LAB — GLUCOSE, CAPILLARY: Glucose-Capillary: 89 mg/dL (ref 70–99)

## 2023-09-07 LAB — HM COLONOSCOPY

## 2023-09-07 SURGERY — ESOPHAGOGASTRODUODENOSCOPY (EGD) WITH PROPOFOL
Anesthesia: General

## 2023-09-07 MED ORDER — STERILE WATER FOR IRRIGATION IR SOLN
Status: DC | PRN
  Administered 2023-09-07: 60 mL

## 2023-09-07 MED ORDER — LACTATED RINGERS IV SOLN: INTRAVENOUS | Status: DC | PRN

## 2023-09-07 MED ORDER — LIDOCAINE HCL 1 % IJ SOLN
INTRAMUSCULAR | Status: DC | PRN
  Administered 2023-09-07: 50 mg via INTRADERMAL

## 2023-09-07 MED ORDER — PROPOFOL 500 MG/50ML IV EMUL
INTRAVENOUS | Status: DC | PRN
  Administered 2023-09-07: 125 ug/kg/min via INTRAVENOUS

## 2023-09-07 MED ORDER — PROPOFOL 10 MG/ML IV BOLUS
INTRAVENOUS | Status: DC | PRN
  Administered 2023-09-07: 25 mg via INTRAVENOUS
  Administered 2023-09-07 (×2): 30 mg via INTRAVENOUS
  Administered 2023-09-07: 75 mg via INTRAVENOUS
  Administered 2023-09-07: 50 mg via INTRAVENOUS

## 2023-09-07 NOTE — Op Note (Signed)
[facility] Patient Name: Glenda Thompson Procedure Date: 09/07/2023 12:08 PM MRN: [id number] Date of Birth: [birth year] Attending MD: Sanjuan Dame , MD, [id number] CSN: [id number] Age: 52 Admit Type: Outpatient Procedure:                Colonoscopy Indications:              Generalized abdominal pain, Unexplained iron                            deficiency anemia, history of colitis Providers:                Sanjuan Dame, MD, Angelica Ran, Lennice Sites                            Technician, Technician Referring MD:              Medicines:                Monitored Anesthesia Care Complications:            No immediate complications. Estimated Blood Loss:     Estimated blood loss: none. Procedure:                Pre-Anesthesia Assessment:                           - Prior to the procedure, a History and Physical                            was performed, and patient medications and                            allergies were reviewed. The patient's tolerance of                            previous anesthesia was also reviewed. The risks                            and benefits of the procedure and the sedation                            options and risks were discussed with the patient.                            All questions were answered, and informed consent                            was obtained. Prior Anticoagulants: The patient has                            taken no anticoagulant or antiplatelet agents. ASA                            Grade Assessment: III - A patient with severe  systemic disease. After reviewing the risks and                            benefits, the patient was deemed in satisfactory                            condition to undergo the procedure.                           After obtaining informed consent, the colonoscope                            was passed under direct vision. Throughout the                            procedure, the  patient's blood pressure, pulse, and                            oxygen saturations were monitored continuously. The                            [phone]) scope was introduced through                            the anus and advanced to the the terminal ileum.                            The colonoscopy was performed without difficulty.                            The patient tolerated the procedure well. The                            quality of the bowel preparation was evaluated                            using the BBPS [facility] Bowel Preparation Scale)                            with scores of: Right Colon = 3, Transverse Colon =                            3 and Left Colon = 3 (entire mucosa seen well with                            no residual staining, small fragments of stool or                            opaque liquid). The total BBPS score equals 9. The                            terminal ileum, ileocecal valve, appendiceal  orifice, and rectum were photographed. Scope In: 1:07:37 PM Scope Out: 1:23:48 PM Scope Withdrawal Time: 0 hours 12 minutes 16 seconds  Total Procedure Duration: 0 hours 16 minutes 11 seconds  Findings:      A few small-mouthed diverticula were found in the left colon.      There is no endoscopic evidence of bleeding, erythema or ulcerations in       the entire colon.      The terminal ileum appeared normal.      Non-bleeding external and internal hemorrhoids were found during       retroflexion. The hemorrhoids were small. Impression:               - Diverticulosis in the left colon.                           - The examined portion of the ileum was normal.                           - Non-bleeding external and internal hemorrhoids.                           - No specimens collected. Moderate Sedation:      Per Anesthesia Care Recommendation:           - Patient has a contact number available for                             emergencies. The signs and symptoms of potential                            delayed complications were discussed with the                            patient. Return to normal activities tomorrow.                            Written discharge instructions were provided to the                            patient.                           - Resume previous diet.                           - Continue present medications.                           - Repeat colonoscopy in 10 years for screening                            purposes. Procedure Code(s):        --- Professional ---                           [phone], Colonoscopy, flexible; diagnostic, including  collection of specimen(s) by brushing or washing,                            when performed (separate procedure) Diagnosis Code(s):        --- Professional ---                           K64.8, Other hemorrhoids                           R10.84, Generalized abdominal pain                           D50.9, Iron deficiency anemia, unspecified                           K57.30, Diverticulosis of large intestine without                            perforation or abscess without bleeding CPT copyright 2022 American Medical Association. All rights reserved. The codes documented in this report are preliminary and upon coder review may  be revised to meet current compliance requirements. Sanjuan Dame, MD Sanjuan Dame, MD 09/07/2023 1:32:26 PM This report has been signed electronically. Number of Addenda: 0

## 2023-09-07 NOTE — Transfer of Care (Signed)
Immediate Anesthesia Transfer of Care Note  Patient: Glenda Thompson  Procedure(s) Performed: ESOPHAGOGASTRODUODENOSCOPY (EGD) WITH PROPOFOL COLONOSCOPY WITH PROPOFOL BIOPSY  Patient Location: PACU  Anesthesia Type:General  Level of Consciousness: awake  Airway & Oxygen Therapy: Patient Spontanous Breathing  Post-op Assessment: Report given to RN  Post vital signs: Reviewed  Last Vitals:  Vitals Value Taken Time  BP 111/51 09/07/23 1335  Temp 36.6 C 09/07/23 1332  Pulse 73 09/07/23 1335  Resp 16 09/07/23 1335  SpO2 100 % 09/07/23 1335    Last Pain:  Vitals:   09/07/23 1332  TempSrc: Oral  PainSc: 0-No pain         Complications: No notable events documented.

## 2023-09-07 NOTE — H&P (Signed)
Primary Care Physician:  Kerri Perches, MD Primary Gastroenterologist:  Dr. Tasia Catchings  Pre-Procedure History & Physical: HPI:  Glenda Thompson is a 52 y.o. female with DM, HLD, HTN, obesity  who presents for evaluation of Chronic constipation, abdominal pain with history of colitis and GERD    Patient reports recently she has severe epigastric pain mostly after eating fatty or spicy.  Patient today presented in the clinic with diffuse abdominal pain which she reports is severe and states this is because she has not had any food since last night due to having blood work done today.  He does denies any blood in the stool but has worsening abdominal pain which is now diffuse.  Patient reports [place] stool 1-2 bowel movement and says she will be lucky if she has identification twice a month.  Patient does feel nauseous at times especially with spicy and fatty food The patient denies having any fever, chills, hematochezia, melena, hematemesis, , diarrhea, jaundice, pruritus or weight loss.   Last ZOX:WRUE Last Colonoscopy:2020   - Altered vascular, congested, erythematous and ulcerated mucosa from 50 to 70 cm proximal to the anus. Possible ischemic colitis. Biopsied. - The distal rectum and anal verge are normal on retroflexion view.   Was given  ciprofloxacin an Flagyl and ciprofloxacin for 7 days   Recs: Pathology showed presence of changes of colitis with ulceration, likely related to ischemia, no cytopathic changes seen, repeat colonoscopy in 10 years.   Patient was prescribed antibiotic, left a voice message stating that she needs to call back if she has any recurrent abdominal pain or blood in her stool as we may need to do further imaging with a CT angio.   Lab work from 12/2022 with normal liver enzymes hemoglobin 11.2 previous hemoglobin 12.9 in 02/2022   .FHx: neg for any gastrointestinal/liver disease, no malignancies Social: neg smoking, alcohol or illicit drug use Surgical: no  abdominal surgeries  Past Medical History:  Diagnosis Date   Arthritis    Diabetes mellitus    Diabetes mellitus without complication (HCC)    Phreesia 01/19/2021   Hyperlipidemia    Hypertension     Past Surgical History:  Procedure Laterality Date   BIOPSY  01/22/2021   Procedure: BIOPSY;  Surgeon: Dolores Frame, MD;  [facility]: [facility];  Service: Gastroenterology;;   CESAREAN SECTION     COLONOSCOPY  01/22/2021   COLONOSCOPY WITH PROPOFOL N/A 01/22/2021   Procedure: COLONOSCOPY WITH PROPOFOL;  Surgeon: Dolores Frame, MD;  Location: [facility];  Service: Gastroenterology;  Laterality: N/A;  Am    Prior to Admission medications   Medication Sig Start Date End Date Taking? Authorizing Provider  amLODipine (NORVASC) 10 MG tablet TAKE 1 TABLET(10 MG) BY MOUTH DAILY 12/14/22   Kerri Perches, MD  Continuous Blood Gluc Receiver (FREESTYLE LIBRE 2 READER) DEVI To check glucose dx e11.9 06/22/22   Kerri Perches, MD  Continuous Blood Gluc Sensor (FREESTYLE LIBRE 2 SENSOR) MISC TO CHECK GLUCOSE DX E11.9 12/19/22   Kerri Perches, MD  glipiZIDE (GLUCOTROL XL) 5 MG 24 hr tablet TAKE 1 TABLET BY MOUTH EVERY DAY WITH BREAKFAST 02/06/23   Kerri Perches, MD  Insulin Pen Needle (B-D ULTRAFINE III SHORT PEN) 31G X 8 MM MISC Use as directed to inject insulin daily. 06/28/18   Kerri Perches, MD  lisinopril-hydrochlorothiazide (ZESTORETIC) 20-12.5 MG tablet TAKE 2 TABLETS BY MOUTH DAILY 12/14/22   Kerri Perches, MD  metaxalone (  SKELAXIN) 800 MG tablet Take 800 mg by mouth 2 (two) times daily as needed. 12/19/22   [provider]  nabumetone (RELAFEN) 750 MG tablet Take 750 mg by mouth 2 (two) times daily. 12/19/22   [provider]  ondansetron (ZOFRAN-ODT) 4 MG disintegrating tablet Take 1 tablet (4 mg total) by mouth every 8 (eight) hours as needed for nausea or vomiting. Patient not taking: Reported on 06/27/2023 10/29/22   Horton,  Mayer Masker, MD  polyethylene glycol (MIRALAX / GLYCOLAX) 17 g packet Take 17 g by mouth 2 (two) times daily. 06/27/23 09/25/23  Franky Macho, MD  psyllium (METAMUCIL) 58.6 % packet Take 1 packet by mouth 2 (two) times daily. 06/27/23 09/25/23  Franky Macho, MD  rosuvastatin (CRESTOR) 5 MG tablet Take 1 tablet (5 mg total) by mouth daily. 11/08/22   Kerri Perches, MD  tirzepatide [facility]) 5 MG/0.5ML Pen Inject 5 mg into the skin once a week. 09/05/23   Kerri Perches, MD    Allergies as of 08/25/2023   (No Known Allergies)    Family History  Problem Relation Age of Onset   Hypertension Mother    Diabetes Mother    Hyperlipidemia Mother    Diabetes Brother     Social History   Socioeconomic History   Marital status: Married    Spouse name: Not on file   Number of children: Not on file   Years of education: Not on file   Highest education level: Not on file  Occupational History   Not on file  Tobacco Use   Smoking status: Never   Smokeless tobacco: Never  Vaping Use   Vaping status: Never Used  Substance and Sexual Activity   Alcohol use: Never   Drug use: Never   Sexual activity: Not on file  Other Topics Concern   Not on file  Social History Narrative   Not on file   Social Determinants of Health   Financial Resource Strain: Low Risk  (06/15/2022)   Overall Financial Resource Strain (CARDIA)    Difficulty of Paying Living Expenses: Not hard at all  Food Insecurity: No Food Insecurity (07/18/2022)   Hunger Vital Sign    Worried About Running Out of Food in the Last Year: Never true    Ran Out of Food in the Last Year: Never true  Transportation Needs: No Transportation Needs (06/15/2022)   PRAPARE - Administrator, Civil Service (Medical): No    Lack of Transportation (Non-Medical): No  Physical Activity: Insufficiently Active (07/18/2022)   Exercise Vital Sign    Days of Exercise per Week: 3 days    Minutes of Exercise per Session: 30  min  Stress: No Stress Concern Present (06/15/2022)   [employer] of Occupational Health - Occupational Stress Questionnaire    Feeling of Stress : Only a little  Social Connections: Unknown (02/01/2023)   [facility] from [facility]   Social Network    Social Network: Not on file  Intimate Partner Violence: Unknown (02/01/2023)   Received from [facility], [facility]   HITS    Physically Hurt: Not on file    Insult or Talk Down To: Not on file    Threaten Physical Harm: Not on file    Scream or Curse: Not on file    Review of Systems: See HPI, otherwise negative ROS  Physical Exam: Vital signs in last 24 hours:     General:   Alert,  Well-developed, well-nourished, pleasant and cooperative in NAD Head:  Normocephalic and atraumatic. Eyes:  Sclera clear, no icterus.   Conjunctiva pink. Ears:  Normal auditory acuity. Nose:  No deformity, discharge,  or lesions. Msk:  Symmetrical without gross deformities. Normal posture. Extremities:  Without clubbing or edema. Neurologic:  Alert and  oriented x4;  grossly normal neurologically. Skin:  Intact without significant lesions or rashes. Psych:  Alert and cooperative. Normal mood and affect.  Impression/Plan: Glenda Thompson is a 52 y.o. female with DM, HLD, HTN, obesity  who presents for evaluation of Chronic constipation, abdominal pain with history of colitis and GERD . Proceed with ileo-colonoscopy and Upper endoscopy     The risks of the procedure including infection, bleed, or perforation as well as benefits, limitations, alternatives and imponderables have been reviewed with the patient. Questions have been answered. All parties agreeable.

## 2023-09-07 NOTE — Anesthesia Preprocedure Evaluation (Signed)
Anesthesia Evaluation  Patient identified by MRN, date of birth, ID band Patient awake    Reviewed: Allergy & Precautions, NPO status , Patient's Chart, lab work & pertinent test results  History of Anesthesia Complications Negative for: history of anesthetic complications  Airway Mallampati: II  TM Distance: >3 FB Neck ROM: Full    Dental  (+) Edentulous Upper, Edentulous Lower   Pulmonary neg pulmonary ROS   Pulmonary exam normal breath sounds clear to auscultation       Cardiovascular Exercise Tolerance: Good hypertension, Pt. on medications Normal cardiovascular exam Rhythm:Regular Rate:Normal     Neuro/Psych negative neurological ROS  negative psych ROS   GI/Hepatic Neg liver ROS,GERD  ,,  Endo/Other  diabetes, Well Controlled, Type 2, Oral Hypoglycemic Agents    Renal/GU   negative genitourinary   Musculoskeletal  (+) Arthritis , Osteoarthritis,    Abdominal   Peds  Hematology   Anesthesia Other Findings   Reproductive/Obstetrics                             Anesthesia Physical Anesthesia Plan  ASA: 3  Anesthesia Plan: General   Post-op Pain Management: Minimal or no pain anticipated   Induction: Intravenous  PONV Risk Score and Plan: Propofol infusion  Airway Management Planned: Nasal Cannula and Natural Airway  Additional Equipment: None  Intra-op Plan:   Post-operative Plan:   Informed Consent: I have reviewed the patients History and Physical, chart, labs and discussed the procedure including the risks, benefits and alternatives for the proposed anesthesia with the patient or authorized representative who has indicated his/her understanding and acceptance.       Plan Discussed with: CRNA  Anesthesia Plan Comments:         Anesthesia Quick Evaluation

## 2023-09-07 NOTE — Anesthesia Postprocedure Evaluation (Signed)
Anesthesia Post Note  Patient: Glenda Thompson  Procedure(s) Performed: ESOPHAGOGASTRODUODENOSCOPY (EGD) WITH PROPOFOL COLONOSCOPY WITH PROPOFOL BIOPSY  Patient location during evaluation: Short Stay Anesthesia Type: General Level of consciousness: awake and alert Pain management: pain level controlled Vital Signs Assessment: post-procedure vital signs reviewed and stable Respiratory status: spontaneous breathing Cardiovascular status: blood pressure returned to baseline Postop Assessment: no apparent nausea or vomiting Anesthetic complications: no   No notable events documented.   Last Vitals:  Vitals:   09/07/23 1332 09/07/23 1335  BP: (!) 115/50 (!) 111/51  Pulse: 74 73  Resp: 18 16  Temp: 36.6 C   SpO2: 100% 100%    Last Pain:  Vitals:   09/07/23 1332  TempSrc: Oral  PainSc: 0-No pain                 Denilson Salminen

## 2023-09-07 NOTE — Op Note (Signed)
[facility] Patient Name: Glenda Thompson Procedure Date: 09/07/2023 12:13 PM MRN: [id number] Date of Birth: 1971-04-26 Attending MD: Sanjuan Dame , MD, [id number] CSN: [id number] Age: 52 Admit Type: Outpatient Procedure:                Upper GI endoscopy Indications:              Epigastric abdominal pain Providers:                Sanjuan Dame, MD, Angelica Ran, Lennice Sites                            Technician, Technician Referring MD:              Medicines:                Monitored Anesthesia Care Complications:            No immediate complications. Estimated Blood Loss:     Estimated blood loss was minimal. Procedure:                Pre-Anesthesia Assessment:                           - Prior to the procedure, a History and Physical                            was performed, and patient medications and                            allergies were reviewed. The patient's tolerance of                            previous anesthesia was also reviewed. The risks                            and benefits of the procedure and the sedation                            options and risks were discussed with the patient.                            All questions were answered, and informed consent                            was obtained. Prior Anticoagulants: The patient has                            taken no anticoagulant or antiplatelet agents. ASA                            Grade Assessment: III - A patient with severe                            systemic disease. After reviewing the risks and  benefits, the patient was deemed in satisfactory                            condition to undergo the procedure.                           After obtaining informed consent, the endoscope was                            passed under direct vision. Throughout the                            procedure, the patient's blood pressure, pulse, and                            oxygen  saturations were monitored continuously. The                            [id number]-H190 ([id number]) scope was introduced through the                            mouth, and advanced to the third part of duodenum.                            The upper GI endoscopy was accomplished without                            difficulty. The patient tolerated the procedure                            well. Scope In: 12:59:03 PM Scope Out: 1:03:11 PM Total Procedure Duration: 0 hours 4 minutes 8 seconds  Findings:      The examined esophagus was normal.      There is no endoscopic evidence of esophagitis in the entire esophagus.      A 1 cm hiatal hernia was present.      Mildly erythematous mucosa without bleeding was found in the stomach.       Biopsies were taken with a cold forceps for histology.      The duodenal bulb, second portion of the duodenum and third portion of       the duodenum were normal. Impression:               - Normal esophagus.                           - 1 cm hiatal hernia.                           - Erythematous mucosa in the stomach. Biopsied.                           - Normal duodenal bulb, second portion of the                            duodenum and third portion of  the duodenum. Moderate Sedation:      Per Anesthesia Care Recommendation:           - Patient has a contact number available for                            emergencies. The signs and symptoms of potential                            delayed complications were discussed with the                            patient. Return to normal activities tomorrow.                            Written discharge instructions were provided to the                            patient.                           - Resume previous diet.                           - Continue present medications.                           - Await pathology results. Procedure Code(s):        --- Professional ---                           [phone],  Esophagogastroduodenoscopy, flexible,                            transoral; with biopsy, single or multiple Diagnosis Code(s):        --- Professional ---                           K44.9, Diaphragmatic hernia without obstruction or                            gangrene                           K31.89, Other diseases of stomach and duodenum                           R10.13, Epigastric pain CPT copyright 2022 American Medical Association. All rights reserved. The codes documented in this report are preliminary and upon coder review may  be revised to meet current compliance requirements. Sanjuan Dame, MD Sanjuan Dame, MD 09/07/2023 1:25:24 PM This report has been signed electronically. Number of Addenda: 0

## 2023-09-07 NOTE — Discharge Instructions (Signed)

## 2023-09-08 ENCOUNTER — Encounter (INDEPENDENT_AMBULATORY_CARE_PROVIDER_SITE_OTHER): Payer: Self-pay | Admitting: *Deleted

## 2023-09-08 LAB — SURGICAL PATHOLOGY

## 2023-09-13 ENCOUNTER — Encounter (HOSPITAL_COMMUNITY): Payer: Self-pay | Admitting: Gastroenterology

## 2023-09-14 NOTE — Progress Notes (Signed)
I reviewed the pathology results. Ann, can you send her a letter with the findings as described below please? Repeat colonoscopy in 10 years  Thanks,  Glenda Lawman, MD Gastroenterology and Hepatology [facility] G[facility]  ---------------------------------------------------------------------------------------------  [facility] [address], [place], [place] [address] Phone:  [phone]   09/14/23 [name], [place]   Dear Glenda Thompson,  I am writing to inform you that the biopsies taken during your recent endoscopic examination showed:  No H. Pylori bacteria in stomach . Hence upper endoscopy did not show anything concerning  Also it is recommended that your next colonoscopy be performed in 10 years.  Please call us at [phone] if you have persistent problems or have questions about your condition that have not been fully answered at this time.  Sincerely,  Glenda Lawman, MD Gastroenterology and Hepatology

## 2023-09-18 ENCOUNTER — Ambulatory Visit (INDEPENDENT_AMBULATORY_CARE_PROVIDER_SITE_OTHER): Payer: BC Managed Care – PPO

## 2023-09-18 DIAGNOSIS — E1159 Type 2 diabetes mellitus with other circulatory complications: Secondary | ICD-10-CM | POA: Diagnosis not present

## 2023-09-18 LAB — HM DIABETES EYE EXAM

## 2023-09-18 NOTE — Progress Notes (Signed)
Glenda Thompson arrived 09/18/2023 and has given verbal consent to obtain images and complete their overdue diabetic retinal screening.  The images have been sent to an ophthalmologist or optometrist for review and interpretation.  Results will be sent back to Kerri Perches, MD for review.  Patient has been informed they will be contacted when we receive the results via telephone or MyChart

## 2023-09-19 ENCOUNTER — Encounter (INDEPENDENT_AMBULATORY_CARE_PROVIDER_SITE_OTHER): Payer: Self-pay | Admitting: *Deleted

## 2023-09-26 ENCOUNTER — Other Ambulatory Visit: Payer: Self-pay | Admitting: Family Medicine

## 2023-09-27 NOTE — Progress Notes (Addendum)
I have reviewed and agree with the  documentation. Milus Mallick. Lodema Hong, MD [facility] Primary Care Location Provider: office Location Patient: office

## 2023-09-28 NOTE — Progress Notes (Signed)
I have reviewed and agree with the  documentation. Milus Mallick. Lodema Hong, MD [facility] Primary Care Location Provider: office Location Patient: office

## 2023-11-15 ENCOUNTER — Ambulatory Visit: Payer: BC Managed Care – PPO | Admitting: Family Medicine

## 2023-11-15 ENCOUNTER — Encounter: Payer: Self-pay | Admitting: Family Medicine

## 2023-11-15 VITALS — BP 137/82 | HR 70 | Ht 65.0 in | Wt 201.1 lb

## 2023-11-15 DIAGNOSIS — E782 Mixed hyperlipidemia: Secondary | ICD-10-CM

## 2023-11-15 DIAGNOSIS — E1159 Type 2 diabetes mellitus with other circulatory complications: Secondary | ICD-10-CM

## 2023-11-15 DIAGNOSIS — I10 Essential (primary) hypertension: Secondary | ICD-10-CM | POA: Diagnosis not present

## 2023-11-15 DIAGNOSIS — E559 Vitamin D deficiency, unspecified: Secondary | ICD-10-CM

## 2023-11-15 DIAGNOSIS — Z1231 Encounter for screening mammogram for malignant neoplasm of breast: Secondary | ICD-10-CM

## 2023-11-15 MED ORDER — TIRZEPATIDE 7.5 MG/0.5ML ~~LOC~~ SOAJ: 7.5000 mg | SUBCUTANEOUS | 0 refills | Status: DC

## 2023-11-15 MED ORDER — ROSUVASTATIN CALCIUM 5 MG PO TABS: 5.0000 mg | ORAL_TABLET | Freq: Every day | ORAL | 1 refills | Status: DC

## 2023-11-15 NOTE — Patient Instructions (Signed)
Annual exam in 13 weeks, call if you need me sooner.  Labs today lipids CMP and EGFR HbA1c and vitamin D level.([facility])  Please schedule mammogram at checkout.  New management for diabetes the dose of Mounjaro is increased to 7.5 mg weekly and once you start this you need to stop taking glipizide.  Please schedule a diabetic eye exam with your regular provider as soon as possible, the screening test that you had in the office was unreadable so you do need your diabetic eye exam.  Please keep an open mind to the COVID-vaccine it is recommended.  Congrats on ongoing weight loss keep up the good work.  Thanks for choosing [facility], we consider it a privelige to serve you.

## 2023-11-16 ENCOUNTER — Encounter: Payer: Self-pay | Admitting: Family Medicine

## 2023-11-16 LAB — HEMOGLOBIN A1C
Hgb A1c MFr Bld: 5.7 %{Hb} — ABNORMAL HIGH (ref <5.7)
Mean Plasma Glucose: 117 mg/dL
eAG (mmol/L): 6.5 mmol/L

## 2023-11-16 LAB — VITAMIN D 25 HYDROXY (VIT D DEFICIENCY, FRACTURES): Vit D, 25-Hydroxy: 31 ng/mL (ref 30–100)

## 2023-11-16 LAB — COMPLETE METABOLIC PANEL WITH GFR
AG Ratio: 1.7 (calc) (ref 1.0–2.5)
ALT: 13 U/L (ref 6–29)
AST: 18 U/L (ref 10–35)
Albumin: 4.2 g/dL (ref 3.6–5.1)
Alkaline phosphatase (APISO): 51 U/L (ref 37–153)
BUN: 22 mg/dL (ref 7–25)
CO2: 28 mmol/L (ref 20–32)
Calcium: 9.6 mg/dL (ref 8.6–10.4)
Chloride: 107 mmol/L (ref 98–110)
Creat: 0.98 mg/dL (ref 0.50–1.03)
Globulin: 2.5 g/dL (ref 1.9–3.7)
Glucose, Bld: 71 mg/dL (ref 65–99)
Potassium: 4.2 mmol/L (ref 3.5–5.3)
Sodium: 141 mmol/L (ref 135–146)
Total Bilirubin: 0.4 mg/dL (ref 0.2–1.2)
Total Protein: 6.7 g/dL (ref 6.1–8.1)
eGFR: 69 mL/min/{1.73_m2} (ref >=60)

## 2023-11-16 LAB — LIPID PANEL
Cholesterol: 208 mg/dL — ABNORMAL HIGH (ref <200)
HDL: 62 mg/dL (ref >=50)
LDL Cholesterol (Calc): 130 mg/dL — ABNORMAL HIGH
Non-HDL Cholesterol (Calc): 146 mg/dL — ABNORMAL HIGH (ref <130)
Total CHOL/HDL Ratio: 3.4 (calc) (ref <5.0)
Triglycerides: 65 mg/dL (ref <150)

## 2023-11-27 ENCOUNTER — Other Ambulatory Visit: Payer: Self-pay | Admitting: Family Medicine

## 2023-11-27 DIAGNOSIS — E782 Mixed hyperlipidemia: Secondary | ICD-10-CM

## 2023-11-28 ENCOUNTER — Other Ambulatory Visit: Payer: Self-pay

## 2023-11-28 DIAGNOSIS — E782 Mixed hyperlipidemia: Secondary | ICD-10-CM

## 2023-11-28 MED ORDER — ROSUVASTATIN CALCIUM 5 MG PO TABS: 5.0000 mg | ORAL_TABLET | Freq: Every day | ORAL | 1 refills | Status: DC

## 2023-12-15 DIAGNOSIS — Z1231 Encounter for screening mammogram for malignant neoplasm of breast: Secondary | ICD-10-CM | POA: Insufficient documentation

## 2023-12-15 NOTE — Assessment & Plan Note (Signed)
  Patient re-educated about  the importance of commitment to a  minimum of 150 minutes of exercise per week as able.  The importance of healthy food choices with portion control discussed, as well as eating regularly and within a 12 hour window most days. The need to choose "clean , green" food 50 to 75% of the time is discussed, as well as to make water the primary drink and set a goal of 64 ounces water daily.       11/15/2023    8:32 AM 09/01/2023    1:05 PM 06/27/2023   11:42 AM  Weight /BMI  Weight 201 lb 1.3 oz 209 lb 3.5 oz 209 lb 4.8 oz  Height 5\' 5"  (1.651 m) 5\' 5"  (1.651 m) 5\' 5"  (1.651 m)  BMI 33.46 kg/m2 34.82 kg/m2 34.83 kg/m2    Congratulated on weight loss and encouraged to continue same

## 2023-12-15 NOTE — Assessment & Plan Note (Signed)
Diabetes associated with hypertension, hyperlipidemia, and obesity  [name] is reminded of the importance of commitment to daily physical activity for 30 minutes or more, as able and the need to limit carbohydrate intake to 30 to 60 grams per meal to help with blood sugar control.  Improved, higher dose mounjaro and stop glpizide  The need to take medication as prescribed, test blood sugar as directed, and to call between visits if there is a concern that blood sugar is uncontrolled is also discussed.   [name] is reminded of the importance of daily foot exam, annual eye examination, and good blood sugar, blood pressure and cholesterol control.     Latest Ref Rng & Units 11/15/2023    9:30 AM 06/27/2023   11:26 AM 06/27/2023   11:23 AM 01/03/2023    5:00 PM 12/30/2022    8:45 AM  Diabetic Labs  HbA1c <5.7 % of total Hgb 5.7  6.0    6.2   Micro/Creat Ratio 0 - 29 mg/g creat    <3    Chol <200 mg/dL 387  564    332   HDL > OR = 50 mg/dL 62  60    60   Calc LDL mg/dL (calc) 951  884    97   Triglycerides <150 mg/dL 65  93    82   Creatinine 0.50 - 1.03 mg/dL 1.66  0.63  0.16   0.10       11/15/2023    8:33 AM 11/15/2023    8:32 AM 09/07/2023    1:39 PM 09/07/2023    1:35 PM 09/07/2023    1:32 PM 09/07/2023   11:12 AM 09/01/2023    1:05 PM  BP/Weight  Systolic BP 137 141 116 111 115 151 130  Diastolic BP 82 79 61 51 50 68 55  Wt. (Lbs)  201.08     209.22  BMI  33.46 kg/m2     34.82 kg/m2      09/18/2023    1:24 PM 03/18/2022    4:00 PM  Foot/eye exam completion dates  Eye Exam --       Foot Form Completion  Done     This result is from an external source.

## 2023-12-15 NOTE — Progress Notes (Signed)
Glenda Thompson     MRN: [id number]      DOB: 1971/03/23  Chief Complaint  Patient presents with   Follow-up    Follow up    HPI Glenda Thompson is here for follow up and re-evaluation of chronic medical conditions, medication management and review of any available recent lab and radiology data.  Preventive health is updated, specifically  Cancer screening and Immunization.   Questions or concerns regarding consultations or procedures which the PT has had in the interim are  addressed. The PT denies any adverse reactions to current medications since the last visit.  There are no new concerns.  There are no specific complaints   ROS Denies recent fever or chills. Denies sinus pressure, nasal congestion, ear pain or sore throat. Denies chest congestion, productive cough or wheezing. Denies chest pains, palpitations and leg swelling Denies abdominal pain, nausea, vomiting,diarrhea or constipation.   Denies dysuria, frequency, hesitancy or incontinence. Denies joint pain, swelling and limitation in mobility. Denies headaches, seizures, numbness, or tingling. Denies depression, anxiety or insomnia. Denies skin break down or rash.   PE  BP 137/82 (BP Location: Left Arm, Patient Position: Sitting, Cuff Size: Large)   Pulse 70   Ht 5\' 5"  (1.651 m)   Wt 201 lb 1.3 oz (91.2 kg)   LMP 11/02/2017   SpO2 99%   BMI 33.46 kg/m   Patient alert and oriented and in no cardiopulmonary distress.  HEENT: No facial asymmetry, EOMI,     Neck supple .  Chest: Clear to auscultation bilaterally.  CVS: S1, S2 no murmurs, no S3.Regular rate.  ABD: Soft non tender.   Ext: No edema  MS: Adequate ROM spine, shoulders, hips and knees.  Skin: Intact, no ulcerations or rash noted.  Psych: Good eye contact, normal affect. Memory intact not anxious or depressed appearing.  CNS: CN 2-12 intact, power,  normal throughout.no focal deficits noted.   Assessment & Plan  Type 2 diabetes mellitus  with vascular disease (HCC) Diabetes associated with hypertension, hyperlipidemia, and obesity  Glenda Thompson is reminded of the importance of commitment to daily physical activity for 30 minutes or more, as able and the need to limit carbohydrate intake to 30 to 60 grams per meal to help with blood sugar control.  Improved, higher dose mounjaro and stop glpizide  The need to take medication as prescribed, test blood sugar as directed, and to call between visits if there is a concern that blood sugar is uncontrolled is also discussed.   Glenda Thompson is reminded of the importance of daily foot exam, annual eye examination, and good blood sugar, blood pressure and cholesterol control.     Latest Ref Rng & Units 11/15/2023    9:30 AM 06/27/2023   11:26 AM 06/27/2023   11:23 AM 01/03/2023    5:00 PM 12/30/2022    8:45 AM  Diabetic Labs  HbA1c <5.7 % of total Hgb 5.7  6.0    6.2   Micro/Creat Ratio 0 - 29 mg/g creat    <3    Chol <200 mg/dL 130  865    784   HDL > OR = 50 mg/dL 62  60    60   Calc LDL mg/dL (calc) 696  295    97   Triglycerides <150 mg/dL 65  93    82   Creatinine 0.50 - 1.03 mg/dL 2.84  1.32  4.40   1.02       11/15/2023  8:33 AM 11/15/2023    8:32 AM 09/07/2023    1:39 PM 09/07/2023    1:35 PM 09/07/2023    1:32 PM 09/07/2023   11:12 AM 09/01/2023    1:05 PM  BP/Weight  Systolic BP 137 141 116 111 115 151 130  Diastolic BP 82 79 61 51 50 68 55  Wt. (Lbs)  201.08     209.22  BMI  33.46 kg/m2     34.82 kg/m2      09/18/2023    1:24 PM 03/18/2022    4:00 PM  Foot/eye exam completion dates  Eye Exam --       Foot Form Completion  Done     This result is from an external source.        Vitamin D deficiency Controlled, no change in medication   Morbid obesity  Patient re-educated about  the importance of commitment to a  minimum of 150 minutes of exercise per week as able.  The importance of healthy food choices with portion control discussed, as well as  eating regularly and within a 12 hour window most days. The need to choose "clean , green" food 50 to 75% of the time is discussed, as well as to make water the primary drink and set a goal of 64 ounces water daily.       11/15/2023    8:32 AM 09/01/2023    1:05 PM 06/27/2023   11:42 AM  Weight /BMI  Weight 201 lb 1.3 oz 209 lb 3.5 oz 209 lb 4.8 oz  Height 5\' 5"  (1.651 m) 5\' 5"  (1.651 m) 5\' 5"  (1.651 m)  BMI 33.46 kg/m2 34.82 kg/m2 34.83 kg/m2    Congratulated on weight loss and encouraged to continue same  Essential hypertension, benign Controlled, no change in medication DASH diet and commitment to daily physical activity for a minimum of 30 minutes discussed and encouraged, as a part of hypertension management. The importance of attaining a healthy weight is also discussed.     11/15/2023    8:33 AM 11/15/2023    8:32 AM 09/07/2023    1:39 PM 09/07/2023    1:35 PM 09/07/2023    1:32 PM 09/07/2023   11:12 AM 09/01/2023    1:05 PM  BP/Weight  Systolic BP 137 141 116 111 115 151 130  Diastolic BP 82 79 61 51 50 68 55  Wt. (Lbs)  201.08     209.22  BMI  33.46 kg/m2     34.82 kg/m2       Mixed hyperlipidemia Hyperlipidemia:Low fat diet discussed and encouraged.   Lipid Panel  Lab Results  Component Value Date   CHOL 208 (H) 11/15/2023   HDL 62 11/15/2023   LDLCALC 130 (H) 11/15/2023   TRIG 65 11/15/2023   CHOLHDL 3.4 11/15/2023     Not at goal, needs to take crestor as prescrined

## 2023-12-15 NOTE — Assessment & Plan Note (Signed)
Hyperlipidemia:Low fat diet discussed and encouraged.   Lipid Panel  Lab Results  Component Value Date   CHOL 208 (H) 11/15/2023   HDL 62 11/15/2023   LDLCALC 130 (H) 11/15/2023   TRIG 65 11/15/2023   CHOLHDL 3.4 11/15/2023     Not at goal, needs to take crestor as prescrined

## 2023-12-15 NOTE — Assessment & Plan Note (Signed)
Controlled, no change in medication DASH diet and commitment to daily physical activity for a minimum of 30 minutes discussed and encouraged, as a part of hypertension management. The importance of attaining a healthy weight is also discussed.     11/15/2023    8:33 AM 11/15/2023    8:32 AM 09/07/2023    1:39 PM 09/07/2023    1:35 PM 09/07/2023    1:32 PM 09/07/2023   11:12 AM 09/01/2023    1:05 PM  BP/Weight  Systolic BP 137 141 116 111 115 151 130  Diastolic BP 82 79 61 51 50 68 55  Wt. (Lbs)  201.08     209.22  BMI  33.46 kg/m2     34.82 kg/m2

## 2023-12-15 NOTE — Assessment & Plan Note (Signed)
 Controlled, no change in medication

## 2023-12-20 ENCOUNTER — Encounter: Payer: Self-pay | Admitting: Emergency Medicine

## 2023-12-20 ENCOUNTER — Ambulatory Visit
Admission: EM | Admit: 2023-12-20 | Discharge: 2023-12-20 | Disposition: A | Discharge status: Home / Self Care | Payer: BC Managed Care – PPO | Attending: Family Medicine | Admitting: Family Medicine

## 2023-12-20 DIAGNOSIS — R030 Elevated blood-pressure reading, without diagnosis of hypertension: Secondary | ICD-10-CM | POA: Diagnosis not present

## 2023-12-20 DIAGNOSIS — J111 Influenza due to unidentified influenza virus with other respiratory manifestations: Secondary | ICD-10-CM

## 2023-12-20 LAB — POC COVID19/FLU A&B COMBO
Covid Antigen, POC: NEGATIVE
Influenza A Antigen, POC: NEGATIVE
Influenza B Antigen, POC: NEGATIVE

## 2023-12-20 MED ORDER — BENZONATATE 200 MG PO CAPS: 200.0000 mg | ORAL_CAPSULE | Freq: Three times a day (TID) | ORAL | 0 refills | Status: DC | PRN

## 2023-12-20 MED ORDER — FLUTICASONE PROPIONATE 50 MCG/ACT NA SUSP: 1.0000 | Freq: Two times a day (BID) | NASAL | 2 refills | Status: DC

## 2023-12-20 MED ORDER — OSELTAMIVIR PHOSPHATE 75 MG PO CAPS: 75.0000 mg | ORAL_CAPSULE | Freq: Two times a day (BID) | ORAL | 0 refills | Status: DC

## 2023-12-20 NOTE — ED Provider Notes (Signed)
 [facility]    CSN: [id number] Arrival date & time: 12/20/23  1533      History   Chief Complaint No chief complaint on file.   HPI Glenda Thompson is a 53 y.o. female.   Presenting today with 2-day history of sore throat, congestion, body aches, cough, headaches, fatigue.  Denies chest pain, shortness of breath, abdominal pain, vomiting, diarrhea.  So far trying over-the-counter remedies with minimal relief and states they are running her blood pressure up.  Multiple sick contacts recently.    Past Medical History:  Diagnosis Date   Arthritis    Diabetes mellitus    Diabetes mellitus without complication (HCC)    [employer] 01/19/2021   Hyperlipidemia    Hypertension     Patient Active Problem List   Diagnosis Date Noted   Visit for screening mammogram 12/15/2023   Gastritis and gastroduodenitis 09/07/2023   Grade II hemorrhoids 09/07/2023   BMI 34.0-34.9,adult 06/27/2023   Gastroesophageal reflux disease 06/27/2023   Immunization due 06/22/2023   Nausea 01/08/2023   Anemia 01/08/2023   Need for Tdap vaccination 03/18/2022   Syncope 03/08/2022   Constipation 09/20/2021   Neck mass 03/02/2021   Noninfectious gastroenteritis 01/28/2021   Nerve pain 08/28/2020   Back pain, thoracic 03/06/2019   Essential hypertension, benign 08/09/2018   Annual physical exam 05/15/2013   Vitamin D deficiency 10/13/2011   Mixed hyperlipidemia 09/06/2008   Type 2 diabetes mellitus with vascular disease (HCC) 05/29/2008   Morbid obesity (HCC) 05/29/2008    Past Surgical History:  Procedure Laterality Date   BIOPSY  01/22/2021   Procedure: BIOPSY;  Surgeon: Dolores Frame, MD;  [facility]: [facility];  Service: Gastroenterology;;   BIOPSY  09/07/2023   Procedure: BIOPSY;  Surgeon: Franky Macho, MD;  Location: [facility];  Service: Endoscopy;;   CESAREAN SECTION     COLONOSCOPY  01/22/2021   COLONOSCOPY WITH PROPOFOL N/A 01/22/2021   Procedure:  COLONOSCOPY WITH PROPOFOL;  Surgeon: Dolores Frame, MD;  Location: [facility];  Service: Gastroenterology;  Laterality: N/A;  Am   COLONOSCOPY WITH PROPOFOL N/A 09/07/2023   Procedure: COLONOSCOPY WITH PROPOFOL;  Surgeon: Franky Macho, MD;  Location: [facility];  Service: Endoscopy;  Laterality: N/A;  1:15pm;asa 3   ESOPHAGOGASTRODUODENOSCOPY (EGD) WITH PROPOFOL N/A 09/07/2023   Procedure: ESOPHAGOGASTRODUODENOSCOPY (EGD) WITH PROPOFOL;  Surgeon: Franky Macho, MD;  Location: [facility];  Service: Endoscopy;  Laterality: N/A;  1:15pm;asa 3    OB History   No obstetric history on file.      Home Medications    Prior to Admission medications   Medication Sig Start Date End Date Taking? Authorizing Provider  benzonatate (TESSALON) 200 MG capsule Take 1 capsule (200 mg total) by mouth 3 (three) times daily as needed for cough. 12/20/23  Yes Particia Nearing, PA-C  fluticasone [facility]) 50 MCG/ACT nasal spray Place 1 spray into both nostrils 2 (two) times daily. 12/20/23  Yes Particia Nearing, PA-C  oseltamivir (TAMIFLU) 75 MG capsule Take 1 capsule (75 mg total) by mouth every 12 (twelve) hours. 12/20/23  Yes Particia Nearing, PA-C  amLODipine (NORVASC) 10 MG tablet TAKE 1 TABLET(10 MG) BY MOUTH DAILY 12/14/22   Kerri Perches, MD  Continuous Blood Gluc Receiver (FREESTYLE LIBRE 2 READER) DEVI To check glucose dx e11.9 06/22/22   Kerri Perches, MD  Continuous Blood Gluc Sensor (FREESTYLE LIBRE 2 SENSOR) MISC TO CHECK GLUCOSE DX E11.9  12/19/22   Kerri Perches, MD  Insulin Pen Needle (B-D ULTRAFINE III SHORT PEN) 31G X 8 MM MISC Use as directed to inject insulin daily. 06/28/18   Kerri Perches, MD  lisinopril-hydrochlorothiazide (ZESTORETIC) 20-12.5 MG tablet TAKE 2 TABLETS BY MOUTH DAILY 12/14/22   Kerri Perches, MD  rosuvastatin (CRESTOR) 5 MG tablet Take 1 tablet (5 mg total) by mouth daily. 11/28/23   Kerri Perches, MD   tirzepatide [facility]) 7.5 MG/0.5ML Pen Inject 7.5 mg into the skin once a week. 11/15/23   Kerri Perches, MD    Family History Family History  Problem Relation Age of Onset   Hypertension Mother    Diabetes Mother    Hyperlipidemia Mother    Diabetes Brother     Social History Social History   Tobacco Use   Smoking status: Never   Smokeless tobacco: Never  Vaping Use   Vaping status: Never Used  Substance Use Topics   Alcohol use: Never   Drug use: Never     Allergies   Patient has no known allergies.   Review of Systems Review of Systems PER HPI  Physical Exam Triage Vital Signs ED Triage Vitals  Encounter Vitals Group     BP 12/20/23 1552 (!) 161/83     Systolic BP Percentile --      Diastolic BP Percentile --      Pulse Rate 12/20/23 1552 81     Resp 12/20/23 1552 18     Temp 12/20/23 1552 98.2 F (36.8 C)     Temp Source 12/20/23 1552 Oral     SpO2 12/20/23 1552 97 %     Weight --      Height --      Head Circumference --      Peak Flow --      Pain Score 12/20/23 1553 8     Pain Loc --      Pain Education --      Exclude from Growth Chart --    No data found.  Updated Vital Signs BP (!) 161/83 (BP Location: Right Arm)   Pulse 81   Temp 98.2 F (36.8 C) (Oral)   Resp 18   LMP 11/02/2017   SpO2 97%   Visual Acuity Right Eye Distance:   Left Eye Distance:   Bilateral Distance:    Right Eye Near:   Left Eye Near:    Bilateral Near:     Physical Exam Vitals and nursing note reviewed.  Constitutional:      Appearance: Normal appearance.  HENT:     Head: Atraumatic.     Right Ear: Tympanic membrane and external ear normal.     Left Ear: Tympanic membrane and external ear normal.     Nose: Rhinorrhea present.     Mouth/Throat:     Mouth: Mucous membranes are moist.     Pharynx: Posterior oropharyngeal erythema present.  Eyes:     Extraocular Movements: Extraocular movements intact.     Conjunctiva/sclera: Conjunctivae  normal.  Cardiovascular:     Rate and Rhythm: Normal rate and regular rhythm.     Heart sounds: Normal heart sounds.  Pulmonary:     Effort: Pulmonary effort is normal.     Breath sounds: Normal breath sounds. No wheezing.  Musculoskeletal:        General: Normal range of motion.     Cervical back: Normal range of motion and neck supple.  Skin:  General: Skin is warm and dry.  Neurological:     Mental Status: She is alert and oriented to person, place, and time.  Psychiatric:        Mood and Affect: Mood normal.        Thought Content: Thought content normal.      UC Treatments / Results  Labs (all labs ordered are listed, but only abnormal results are displayed) Labs Reviewed  POC COVID19/FLU A&B COMBO    EKG   Radiology No results found.  Procedures Procedures (including critical care time)  Medications Ordered in UC Medications - No data to display  Initial Impression / Assessment and Plan / UC Course  I have reviewed the triage vital signs and the nursing notes.  Pertinent labs & imaging results that were available during my care of the patient were reviewed by me and considered in my medical decision making (see chart for details).     Hypertensive in triage, otherwise vital signs reassuring.  Rapid flu and COVID-negative, suspect flulike illness causing symptoms.  Discussed safe over-the-counter medications that will not raise blood pressure, and close home blood pressure monitoring and will treat with Tamiflu, Tessalon, Flonase.  Supportive home care and return precautions reviewed.  Final Clinical Impressions(s) / UC Diagnoses   Final diagnoses:  Influenza-like illness  Elevated blood pressure reading     Discharge Instructions      In addition to the prescribed occasions, you may take Coricidin HBP, plain Mucinex, Tylenol, use saline sinus rinses and humidifiers.  These medications will not raise your blood pressure.    ED Prescriptions      Medication Sig Dispense Auth. Provider   oseltamivir (TAMIFLU) 75 MG capsule Take 1 capsule (75 mg total) by mouth every 12 (twelve) hours. 10 capsule Particia Nearing, PA-C   benzonatate (TESSALON) 200 MG capsule Take 1 capsule (200 mg total) by mouth 3 (three) times daily as needed for cough. 20 capsule Particia Nearing, PA-C   fluticasone [facility]) 50 MCG/ACT nasal spray Place 1 spray into both nostrils 2 (two) times daily. 16 g Particia Nearing, [place]      PDMP not reviewed this encounter.   Roosvelt Maser [place], [place] 12/20/23 [phone]

## 2023-12-20 NOTE — ED Triage Notes (Signed)
 Sore throat, nasal congestion, body aches, cough and headaches since Monday.

## 2023-12-20 NOTE — Discharge Instructions (Signed)
 In addition to the prescribed occasions, you may take Coricidin HBP, plain Mucinex, Tylenol, use saline sinus rinses and humidifiers.  These medications will not raise your blood pressure.

## 2024-01-20 ENCOUNTER — Other Ambulatory Visit: Payer: Self-pay | Admitting: Family Medicine

## 2024-01-26 NOTE — Patient Instructions (Signed)
 Visit Information  Thank you for taking time to visit with me today. Please don't hesitate to contact me if I can be of assistance to you.   Following are the goals we discussed today:   Goals Addressed               This Visit's Progress     Patient Stated     Prevent syncope (vasovagal) episodes, Expedite GI referral, manage diabetes with use of Mounjaro((RN CM care coordination services) (pt-stated)   Not on track     Care Coordination Interventions: Interventions Today    Flowsheet Row Most Recent Value  Chronic Disease   Chronic disease during today's visit Other, Diabetes  [abdominal pain, syncope x 3 constipation]  General Interventions   General Interventions Discussed/Reviewed General Interventions Reviewed, Sick Day Rules, Doctor Visits  Doctor Visits Discussed/Reviewed Doctor Visits Reviewed, PCP, Specialist  PCP/Specialist Visits Compliance with follow-up visit  Exercise Interventions   Exercise Discussed/Reviewed Exercise Reviewed, Physical Activity  [continues to be active]  Education Interventions   Education Provided Provided Education  [syncope, constipation, valsalva manuever, home treatment for constipation]  Provided Verbal Education On Nutrition, Exercise, Medication, When to see the doctor, Other  Mental Health Interventions   Mental Health Discussed/Reviewed Mental Health Reviewed, Coping Strategies  Nutrition Interventions   Nutrition Discussed/Reviewed Nutrition Reviewed, Adding fruits and vegetables, Fluid intake  [name] fiber, miralax/metamucil]  Pharmacy Interventions   Pharmacy Dicussed/Reviewed Pharmacy Topics Reviewed, Medications and their functions, Affording Medications  Advanced Directive Interventions   Advanced Directives Discussed/Reviewed Advanced Directives Discussed  [encouraged but not preferring]              Our next appointment is no further scheduled appointments.  on prn at prn  Please call the care guide team at  [phone] if you need to cancel or reschedule your appointment.   If you are experiencing a Mental Health or Behavioral Health Crisis or need someone to talk to, please call the Suicide and Crisis Lifeline: 988 call the [place] National Suicide Prevention Lifeline: 779-545-9311 or TTY: 631-851-5165 TTY 343-599-7097) to talk to a trained counselor call 1-800-273-TALK (toll free, 24 hour hotline) call the [facility]: ([phone] call 911   Patient verbalizes understanding of instructions and care plan provided today and agrees to view in MyChart. Active MyChart status and patient understanding of how to access instructions and care plan via MyChart confirmed with patient.     The patient has been provided with contact information for the care management team and has been advised to call with any health related questions or concerns.   Makennah Omura L. Noelle Penner, RN, BSN, CCM [facility] RN Care Manager [phone]

## 2024-01-30 ENCOUNTER — Telehealth: Payer: Self-pay | Admitting: Family Medicine

## 2024-01-30 MED ORDER — TIRZEPATIDE 7.5 MG/0.5ML ~~LOC~~ SOAJ: 7.5000 mg | SUBCUTANEOUS | 1 refills | Status: DC

## 2024-01-30 NOTE — Telephone Encounter (Signed)
 Prescription Request  01/30/2024  LOV: 11/15/2023  What is the name of the medication or equipment? tirzepatide [facility]) 7.5 MG/0.5ML Pen [[id number]]   Have you contacted your pharmacy to request a refill? No   Which pharmacy would you like this sent to?   [facility]   Patient notified that their request is being sent to the clinical staff for review and that they should receive a response within 2 business days.   Please advise at  patient walked into office

## 2024-01-30 NOTE — Telephone Encounter (Signed)
Pt informed

## 2024-01-30 NOTE — Telephone Encounter (Signed)
 FMLA   Copied Noted Sleeved (put in provider box)  Call patient when ready

## 2024-02-01 NOTE — Telephone Encounter (Signed)
Called patient forms ready for pick up.

## 2024-02-01 NOTE — Telephone Encounter (Signed)
 Patient picked up forms and forms were faxed

## 2024-02-15 ENCOUNTER — Ambulatory Visit: Payer: BC Managed Care – PPO | Admitting: Family Medicine

## 2024-03-21 ENCOUNTER — Encounter: Payer: Self-pay | Admitting: Family Medicine

## 2024-03-21 ENCOUNTER — Ambulatory Visit: Admitting: Family Medicine

## 2024-03-21 VITALS — BP 138/80 | HR 80 | Resp 16 | Ht 65.0 in | Wt 193.0 lb

## 2024-03-21 DIAGNOSIS — E1169 Type 2 diabetes mellitus with other specified complication: Secondary | ICD-10-CM

## 2024-03-21 DIAGNOSIS — I10 Essential (primary) hypertension: Secondary | ICD-10-CM

## 2024-03-21 DIAGNOSIS — E782 Mixed hyperlipidemia: Secondary | ICD-10-CM | POA: Diagnosis not present

## 2024-03-21 DIAGNOSIS — E66811 Obesity, class 1: Secondary | ICD-10-CM

## 2024-03-21 MED ORDER — OLMESARTAN MEDOXOMIL 20 MG PO TABS: 20.0000 mg | ORAL_TABLET | Freq: Every day | ORAL | 2 refills | Status: DC

## 2024-03-21 NOTE — Patient Instructions (Addendum)
 F/u in 6 to 8 weeks, call if you need me sooner'   Need additional med for blood pressure olmesartan 20  mg daily is new  Continue amlodipine  as before  Lower dose of mounjaro  is likely to be started once I see the labs based on your reporting  Fasting lipid, cmp and EGFr, hBA1C and cBC , urine ACR, quest next week  Thanks for choosing [facility], we consider it a privelige to serve you. v

## 2024-03-21 NOTE — Assessment & Plan Note (Signed)
 Hyperlipidemia:Low fat diet discussed and encouraged.   Lipid Panel  Lab Results  Component Value Date   CHOL 208 (H) 11/15/2023   HDL 62 11/15/2023   LDLCALC 130 (H) 11/15/2023   TRIG 65 11/15/2023   CHOLHDL 3.4 11/15/2023     Not at goal Updated lab needed at/ before next visit.

## 2024-03-21 NOTE — Assessment & Plan Note (Addendum)
 Diabetes associated with hypertension, hyperlipidemia, and obesity  Glenda Thompson is reminded of the importance of commitment to daily physical activity for 30 minutes or more, as able and the need to limit carbohydrate intake to 30 to 60 grams per meal to help with blood sugar control.   The need to take medication as prescribed, test blood sugar as directed, and to call between visits if there is a concern that blood sugar is uncontrolled is also discussed.  Updated lab needed at/ before next visit. Will likelyb need lower dose mounjaro   Glenda Thompson is reminded of the importance of daily foot exam, annual eye examination, and good blood sugar, blood pressure and cholesterol control.     Latest Ref Rng & Units 11/15/2023    9:30 AM 06/27/2023   11:26 AM 06/27/2023   11:23 AM 01/03/2023    5:00 PM 12/30/2022    8:45 AM  Diabetic Labs  HbA1c <5.7 % of total Hgb 5.7  6.0    6.2   Micro/Creat Ratio 0 - 29 mg/g creat    <3    Chol <200 mg/dL 782  956    213   HDL > OR = 50 mg/dL 62  60    60   Calc LDL mg/dL (calc) 086  578    97   Triglycerides <150 mg/dL 65  93    82   Creatinine 0.50 - 1.03 mg/dL 4.69  6.29  5.28   4.13       03/21/2024    5:01 PM 03/21/2024    4:42 PM 12/20/2023    3:52 PM 11/15/2023    8:33 AM 11/15/2023    8:32 AM 09/07/2023    1:39 PM 09/07/2023    1:35 PM  BP/Weight  Systolic BP 138 154 161 137 141 116 111  Diastolic BP 80 83 83 82 79 61 51  Wt. (Lbs)  193.04   201.08    BMI  32.12 kg/m2   33.46 kg/m2        09/18/2023    1:24 PM 03/18/2022    4:00 PM  Foot/eye exam completion dates  Eye Exam --       Foot Form Completion  Done     This result is from an external source.

## 2024-03-21 NOTE — Assessment & Plan Note (Signed)
 Ongoing excellent weight loss which is good  Patient re-educated about  the importance of commitment to a  minimum of 150 minutes of exercise per week as able.  The importance of healthy food choices with portion control discussed, as well as eating regularly and within a 12 hour window most days. The need to choose "clean , green" food 50 to 75% of the time is discussed, as well as to make water  the primary drink and set a goal of 64 ounces water  daily.       03/21/2024    4:42 PM 11/15/2023    8:32 AM 09/01/2023    1:05 PM  Weight /BMI  Weight 193 lb 0.6 oz 201 lb 1.3 oz 209 lb 3.5 oz  Height 5\' 5"  (1.651 m) 5\' 5"  (1.651 m) 5\' 5"  (1.651 m)  BMI 32.12 kg/m2 33.46 kg/m2 34.82 kg/m2

## 2024-03-21 NOTE — Progress Notes (Signed)
 Glenda Thompson     MRN: [id number]      DOB: [birth year]  Chief Complaint  Patient presents with   Medical Management of Chronic Issues    13 week follow up     HPI Glenda Thompson is here for follow up and re-evaluation of chronic medical conditions, medication management and review of any available recent lab and radiology data.  Preventive health is updated, specifically  Cancer screening and Immunization.   Questions or concerns regarding consultations or procedures which the PT has had in the interim are  addressed. The PT states she is not consistently taking lisinopril /hydrochlorothiazide  as it makes her feel funny Reports blood sugar falling low at times as she has to force herself to  eat, food just does not taste good    ROS Denies recent fever or chills. Denies sinus pressure, nasal congestion, ear pain or sore throat. Denies chest congestion, productive cough or wheezing. Denies chest pains, palpitations and leg swelling Denies abdominal pain, nausea, vomiting,diarrhea or constipation.   Denies dysuria, frequency, hesitancy or incontinence. Denies joint pain, swelling and limitation in mobility. Denies headaches, seizures, numbness, or tingling. Denies depression, anxiety or insomnia. Denies skin break down or rash.   PE  BP 138/80   Pulse 80   Resp 16   Ht 5\' 5"  (1.651 m)   Wt 193 lb 0.6 oz (87.6 kg)   LMP 11/02/2017   SpO2 98%   BMI 32.12 kg/m   Patient alert and oriented and in no cardiopulmonary distress.  HEENT: No facial asymmetry, EOMI,     Neck supple .  Chest: Clear to auscultation bilaterally.  CVS: S1, S2 no murmurs, no S3.Regular rate.  ABD: Soft non tender.   Ext: No edema  MS: Adequate ROM spine, shoulders, hips and knees.  Skin: Intact, no ulcerations or rash noted.  Psych: Good eye contact, normal affect. Memory intact not anxious or depressed appearing.  CNS: CN 2-12 intact, power,  normal throughout.no focal deficits  noted.   Assessment & Plan  Type 2 diabetes mellitus with other specified complication (HCC) Diabetes associated with hypertension, hyperlipidemia, and obesity  Glenda Thompson is reminded of the importance of commitment to daily physical activity for 30 minutes or more, as able and the need to limit carbohydrate intake to 30 to 60 grams per meal to help with blood sugar control.   The need to take medication as prescribed, test blood sugar as directed, and to call between visits if there is a concern that blood sugar is uncontrolled is also discussed.  Updated lab needed at/ before next visit. Will likelyb need lower dose mounjaro   Glenda Thompson is reminded of the importance of daily foot exam, annual eye examination, and good blood sugar, blood pressure and cholesterol control.     Latest Ref Rng & Units 11/15/2023    9:30 AM 06/27/2023   11:26 AM 06/27/2023   11:23 AM 01/03/2023    5:00 PM 12/30/2022    8:45 AM  Diabetic Labs  HbA1c <5.7 % of total Hgb 5.7  6.0    6.2   Micro/Creat Ratio 0 - 29 mg/g creat    <3    Chol <200 mg/dL 213  086    578   HDL > OR = 50 mg/dL 62  60    60   Calc LDL mg/dL (calc) 469  629    97   Triglycerides <150 mg/dL 65  93    82  Creatinine 0.50 - 1.03 mg/dL 2.13  0.86  5.78   4.69       03/21/2024    5:01 PM 03/21/2024    4:42 PM 12/20/2023    3:52 PM 11/15/2023    8:33 AM 11/15/2023    8:32 AM 09/07/2023    1:39 PM 09/07/2023    1:35 PM  BP/Weight  Systolic BP 138 154 161 137 141 116 111  Diastolic BP 80 83 83 82 79 61 51  Wt. (Lbs)  193.04   201.08    BMI  32.12 kg/m2   33.46 kg/m2        09/18/2023    1:24 PM 03/18/2022    4:00 PM  Foot/eye exam completion dates  Eye Exam --       Foot Form Completion  Done     This result is from an external source.        Obesity (BMI 30.0-34.9) Ongoing excellent weight loss which is good  Patient re-educated about  the importance of commitment to a  minimum of 150 minutes of exercise per week as  able.  The importance of healthy food choices with portion control discussed, as well as eating regularly and within a 12 hour window most days. The need to choose "clean , green" food 50 to 75% of the time is discussed, as well as to make water  the primary drink and set a goal of 64 ounces water  daily.       03/21/2024    4:42 PM 11/15/2023    8:32 AM 09/01/2023    1:05 PM  Weight /BMI  Weight 193 lb 0.6 oz 201 lb 1.3 oz 209 lb 3.5 oz  Height 5\' 5"  (1.651 m) 5\' 5"  (1.651 m) 5\' 5"  (1.651 m)  BMI 32.12 kg/m2 33.46 kg/m2 34.82 kg/m2      Mixed hyperlipidemia Hyperlipidemia:Low fat diet discussed and encouraged.   Lipid Panel  Lab Results  Component Value Date   CHOL 208 (H) 11/15/2023   HDL 62 11/15/2023   LDLCALC 130 (H) 11/15/2023   TRIG 65 11/15/2023   CHOLHDL 3.4 11/15/2023     Not at goal Updated lab needed at/ before next visit.   Essential hypertension, benign Not at goal, intolerant of med, new is olmesartan 20 mg in place of lisinoprol/hydrochlorothiazide  DASH diet and commitment to daily physical activity for a minimum of 30 minutes discussed and encouraged, as a part of hypertension management. The importance of attaining a healthy weight is also discussed.     03/21/2024    5:01 PM 03/21/2024    4:42 PM 12/20/2023    3:52 PM 11/15/2023    8:33 AM 11/15/2023    8:32 AM 09/07/2023    1:39 PM 09/07/2023    1:35 PM  BP/Weight  Systolic BP 138 154 161 137 141 116 111  Diastolic BP 80 83 83 82 79 61 51  Wt. (Lbs)  193.04   201.08    BMI  32.12 kg/m2   33.46 kg/m2

## 2024-03-21 NOTE — Assessment & Plan Note (Signed)
 Not at goal, intolerant of med, new is olmesartan 20 mg in place of lisinoprol/hydrochlorothiazide  DASH diet and commitment to daily physical activity for a minimum of 30 minutes discussed and encouraged, as a part of hypertension management. The importance of attaining a healthy weight is also discussed.     03/21/2024    5:01 PM 03/21/2024    4:42 PM 12/20/2023    3:52 PM 11/15/2023    8:33 AM 11/15/2023    8:32 AM 09/07/2023    1:39 PM 09/07/2023    1:35 PM  BP/Weight  Systolic BP 138 154 161 137 141 116 111  Diastolic BP 80 83 83 82 79 61 51  Wt. (Lbs)  193.04   201.08    BMI  32.12 kg/m2   33.46 kg/m2

## 2024-03-22 ENCOUNTER — Other Ambulatory Visit: Payer: Self-pay

## 2024-03-22 DIAGNOSIS — E782 Mixed hyperlipidemia: Secondary | ICD-10-CM

## 2024-03-22 DIAGNOSIS — I10 Essential (primary) hypertension: Secondary | ICD-10-CM

## 2024-03-22 DIAGNOSIS — E1169 Type 2 diabetes mellitus with other specified complication: Secondary | ICD-10-CM

## 2024-03-22 DIAGNOSIS — D649 Anemia, unspecified: Secondary | ICD-10-CM

## 2024-03-27 ENCOUNTER — Encounter: Payer: Self-pay | Admitting: Family Medicine

## 2024-03-27 ENCOUNTER — Telehealth: Payer: Self-pay

## 2024-03-27 NOTE — Telephone Encounter (Signed)
 Need updated lab t determine dose. I have sent a pt  message  I tried calling , she was unavailable to answer

## 2024-03-27 NOTE — Telephone Encounter (Signed)
 Received fax from [facility], pt needing refill of Mounjaro  7.5mg . Please refill if appropriate

## 2024-04-02 DIAGNOSIS — E782 Mixed hyperlipidemia: Secondary | ICD-10-CM | POA: Diagnosis not present

## 2024-04-02 DIAGNOSIS — D649 Anemia, unspecified: Secondary | ICD-10-CM | POA: Diagnosis not present

## 2024-04-02 DIAGNOSIS — E1169 Type 2 diabetes mellitus with other specified complication: Secondary | ICD-10-CM | POA: Diagnosis not present

## 2024-04-02 DIAGNOSIS — I10 Essential (primary) hypertension: Secondary | ICD-10-CM | POA: Diagnosis not present

## 2024-04-03 ENCOUNTER — Other Ambulatory Visit: Payer: Self-pay | Admitting: Family Medicine

## 2024-04-03 ENCOUNTER — Ambulatory Visit: Payer: Self-pay | Admitting: Family Medicine

## 2024-04-03 LAB — COMPREHENSIVE METABOLIC PANEL WITH GFR
AG Ratio: 1.9 (calc) (ref 1.0–2.5)
ALT: 12 U/L (ref 6–29)
AST: 15 U/L (ref 10–35)
Albumin: 4.2 g/dL (ref 3.6–5.1)
Alkaline phosphatase (APISO): 50 U/L (ref 37–153)
BUN/Creatinine Ratio: 21 (calc) (ref 6–22)
BUN: 23 mg/dL (ref 7–25)
CO2: 30 mmol/L (ref 20–32)
Calcium: 9.7 mg/dL (ref 8.6–10.4)
Chloride: 103 mmol/L (ref 98–110)
Creat: 1.09 mg/dL — ABNORMAL HIGH (ref 0.50–1.03)
Globulin: 2.2 g/dL (ref 1.9–3.7)
Glucose, Bld: 95 mg/dL (ref 65–99)
Potassium: 4.8 mmol/L (ref 3.5–5.3)
Sodium: 138 mmol/L (ref 135–146)
Total Bilirubin: 0.5 mg/dL (ref 0.2–1.2)
Total Protein: 6.4 g/dL (ref 6.1–8.1)
eGFR: 61 mL/min/{1.73_m2} (ref >=60)

## 2024-04-03 LAB — CBC WITH DIFFERENTIAL/PLATELET
Absolute Lymphocytes: 2220 {cells}/uL (ref 850–3900)
Absolute Monocytes: 323 {cells}/uL (ref 200–950)
Basophils Absolute: 31 {cells}/uL (ref 0–200)
Basophils Relative: 0.5 %
Eosinophils Absolute: 128 {cells}/uL (ref 15–500)
Eosinophils Relative: 2.1 %
HCT: 36.3 % (ref 35.0–45.0)
Hemoglobin: 11.7 g/dL (ref 11.7–15.5)
MCH: 27.8 pg (ref 27.0–33.0)
MCHC: 32.2 g/dL (ref 32.0–36.0)
MCV: 86.2 fL (ref 80.0–100.0)
MPV: 10.4 fL (ref 7.5–12.5)
Monocytes Relative: 5.3 %
Neutro Abs: 3398 {cells}/uL (ref 1500–7800)
Neutrophils Relative %: 55.7 %
Platelets: 258 10*3/uL (ref 140–400)
RBC: 4.21 10*6/uL (ref 3.80–5.10)
RDW: 13 % (ref 11.0–15.0)
Total Lymphocyte: 36.4 %
WBC: 6.1 10*3/uL (ref 3.8–10.8)

## 2024-04-03 LAB — LIPID PANEL
Cholesterol: 148 mg/dL (ref <200)
HDL: 67 mg/dL (ref >=50)
LDL Cholesterol (Calc): 67 mg/dL
Non-HDL Cholesterol (Calc): 81 mg/dL (ref <130)
Total CHOL/HDL Ratio: 2.2 (calc) (ref <5.0)
Triglycerides: 56 mg/dL (ref <150)

## 2024-04-03 LAB — MICROALBUMIN / CREATININE URINE RATIO
Creatinine, Urine: 175 mg/dL (ref 20–275)
Microalb Creat Ratio: 3 mg/g{creat} (ref <30)
Microalb, Ur: 0.5 mg/dL

## 2024-04-03 LAB — HEMOGLOBIN A1C
Hgb A1c MFr Bld: 5.9 % — ABNORMAL HIGH (ref <5.7)
Mean Plasma Glucose: 123 mg/dL
eAG (mmol/L): 6.8 mmol/L

## 2024-04-03 NOTE — Telephone Encounter (Unsigned)
 Copied from CRM [id number]. Topic: Clinical - Medication Refill >> Apr 03, 2024  4:39 PM Turkey B wrote: Medication: tirzepatide  (MOUNJARO ) 7.5 MG/0.5ML Pen Pt has updated lab results  Has the patient contacted their pharmacy? yes (pharmacy called in  This is the patient's preferred pharmacy:  [facility] and [address] [place], [place] - [address] [place] [address] Phone: [phone] Fax: [phone]  Is this the correct pharmacy for this prescription? yes If no, delete pharmacy and type the correct one.   Has the prescription been filled recently? no  Is the patient out of the medication? yes  Has the patient been seen for an appointment in the last year OR does the patient have an upcoming appointment? yes  Can we respond through MyChart? yes  Agent: Please be advised that Rx refills may take up to 3 business days. We ask that you follow-up with your pharmacy.

## 2024-04-10 ENCOUNTER — Ambulatory Visit: Payer: Self-pay

## 2024-04-10 NOTE — Telephone Encounter (Signed)
 Attempt 2, unable to leave vm, mailbox full.

## 2024-04-10 NOTE — Telephone Encounter (Signed)
 Attempted to contact pt, unable to leave message as mailbox is full.    Having nosebleeds and headaches ( had a recent med change - BP pills? ).. Call around 11.

## 2024-04-10 NOTE — Telephone Encounter (Signed)
 Unable to reach patient after 3 attempts by E2C2 NT, routing to the provider for resolution per protocol.

## 2024-04-10 NOTE — Telephone Encounter (Signed)
 Unable to reach patient.

## 2024-04-11 ENCOUNTER — Ambulatory Visit: Payer: Self-pay

## 2024-04-11 NOTE — Telephone Encounter (Signed)
 Tried calling, n/a, vm full

## 2024-04-11 NOTE — Telephone Encounter (Signed)
 FYI Only or Action Required?: Action required by provider: clinical question for provider.  Patient was last seen in primary care on 03/21/2024 by Towanda Fret, MD. Called Nurse Triage reporting Epistaxis and Facial Pain. Symptoms began several weeks ago. Interventions attempted: OTC medications: Coricidin and Prescription medications: Olmesartan . Symptoms are: nose bleeds, sinus/facial pain, cough, runny nose stable.  Triage Disposition: Home Care  Patient/caregiver understands and will follow disposition?:  Yes, patient would like provider to advise on symptoms.                           Pt still having headaches and has nosebleeds.  Pt inquiring in antibiotic could be sent in.  Best call contact number: [phone]   Reason for Disposition  [1] Mild-moderate nosebleed AND [2] bleeding stopped now  Answer Assessment - Initial Assessment Questions Patient states she is unsure if this is sinus issues causing her nosebleeds or if it is due to changing her blood pressure medications at her last visit. Patient denies any chest pain, SOB, or fevers.   1. AMOUNT OF BLEEDING: How bad is the bleeding? How much blood was lost? Has the bleeding stopped?   - MILD: needed a couple tissues   - MODERATE: needed many tissues   - SEVERE: large blood clots, soaked many tissues, lasted more than 30 minutes      Sometimes mild, sometimes moderate.  2. ONSET: When did the nosebleed start?      2 weeks.  3. FREQUENCY: How many nosebleeds have you had in the last 24 hours?      1 today and her last one before that was Tuesday night and daily Monday and Sunday.  4. RECURRENT SYMPTOMS: Have there been other recent nosebleeds? If Yes, ask: How long did it take you to stop the bleeding? What worked best?      No.  5. CAUSE: What do you think caused this nosebleed?     Maybe sinus infection or sinus problem or due to change in BP medication. She states  with her jobs she goes between warm and cold environments and is not sure if that is affecting it either.  6. LOCAL FACTORS: Do you have any cold symptoms?, Have you been rubbing or picking at your nose?     Yes she states she had a cough/runny nose/cold symptoms over the past weekend.  7. SYSTEMIC FACTORS: Do you have high blood pressure or any bleeding problems?     Yes, has high blood pressure. She states her BP was 117/64 today.  8. BLOOD THINNERS: Do you take any blood thinners? (e.g., aspirin, clopidogrel / Plavix, coumadin, heparin). Notes: Other strong blood thinners include: Arixtra (fondaparinux), Eliquis (apixaban), Pradaxa (dabigatran), and Xarelto (rivaroxaban).     No.  9. OTHER SYMPTOMS: Do you have any other symptoms? (e.g., lightheadedness)     Sinus/facial pain near eyes and nose and then moves to the back of her head, she states just before her nose is gonna bleed she feels funny like lightheaded.  10. PREGNANCY: Is there any chance you are pregnant? When was your last menstrual period?       N/A.  Protocols used: Nosebleed-A-AH

## 2024-04-15 NOTE — Telephone Encounter (Signed)
 Vm full

## 2024-05-15 ENCOUNTER — Ambulatory Visit: Admitting: Family Medicine

## 2024-05-15 DIAGNOSIS — E66811 Obesity, class 1: Secondary | ICD-10-CM

## 2024-05-15 DIAGNOSIS — E782 Mixed hyperlipidemia: Secondary | ICD-10-CM | POA: Diagnosis not present

## 2024-05-15 DIAGNOSIS — E1169 Type 2 diabetes mellitus with other specified complication: Secondary | ICD-10-CM | POA: Diagnosis not present

## 2024-05-15 DIAGNOSIS — I10 Essential (primary) hypertension: Secondary | ICD-10-CM

## 2024-05-15 MED ORDER — ROSUVASTATIN CALCIUM 5 MG PO TABS: 5.0000 mg | ORAL_TABLET | Freq: Every day | ORAL | 1 refills | Status: DC

## 2024-05-15 MED ORDER — OLMESARTAN MEDOXOMIL-HCTZ 20-12.5 MG PO TABS: 1.0000 | ORAL_TABLET | Freq: Every day | ORAL | 3 refills | Status: DC

## 2024-05-15 MED ORDER — TIRZEPATIDE 5 MG/0.5ML ~~LOC~~ SOAJ
5.0000 mg | SUBCUTANEOUS | 1 refills | Status: DC
Start: 2024-05-15 — End: 2024-05-21

## 2024-05-15 MED ORDER — AMLODIPINE BESYLATE 10 MG PO TABS: ORAL_TABLET | ORAL | 1 refills | Status: DC

## 2024-05-15 NOTE — Patient Instructions (Signed)
 F/U last week in August  New lower dose mounjaro  is 5 mg weekly  New for blood pressure is olmesartan /hydrochlorothiazide  20/12.5 mg one daily Continue amlodipine  10 mg daily asbefore  It is important that you exercise regularly at least 30 minutes 5 times a week. If you develop chest pain, have severe difficulty breathing, or feel very tired, stop exercising immediately and seek medical attention   Think about what you will eat, plan ahead. Choose  clean, green, fresh or frozen over canned, processed or packaged foods which are more sugary, salty and fatty. 70 to 75% of food eaten should be vegetables and fruit. Three meals at set times with snacks allowed between meals, but they must be fruit or vegetables. Aim to eat over a 12 hour period , example 7 am to 7 pm, and STOP after  your last meal of the day. Drink water ,generally about 64 ounces per day, no other drink is as healthy. Fruit juice is best enjoyed in a healthy way, by EATING the fruit.  Thanks for choosing [facility], we consider it a privelige to serve you.

## 2024-05-17 ENCOUNTER — Telehealth: Payer: Self-pay | Admitting: Family Medicine

## 2024-05-17 ENCOUNTER — Other Ambulatory Visit: Payer: Self-pay | Admitting: Family Medicine

## 2024-05-17 NOTE — Telephone Encounter (Signed)
 Copied from CRM [id number]. Topic: Clinical - Prescription Issue >> May 17, 2024  3:07 PM Zebedee SAUNDERS wrote: Reason for CRM: Received call from [facility] per [facility] pt informed pharmacy has decrease from tirzepatide  (MOUNJARO ) 7.5 MG/0.5ML Pen to tirzepatide  (MOUNJARO ) 2.5 MG/0.5ML Pen. Please call or fax to confirm. [address] [place], [place] [address] Ph: [phone], fax: [phone].

## 2024-05-17 NOTE — Telephone Encounter (Signed)
 Gave verification that dose in now 5mg 

## 2024-05-21 ENCOUNTER — Other Ambulatory Visit: Payer: Self-pay

## 2024-05-21 ENCOUNTER — Telehealth: Payer: Self-pay

## 2024-05-21 MED ORDER — TIRZEPATIDE 5 MG/0.5ML ~~LOC~~ SOAJ: 5.0000 mg | SUBCUTANEOUS | 1 refills | Status: DC

## 2024-05-21 NOTE — Telephone Encounter (Signed)
 Sent!

## 2024-05-21 NOTE — Telephone Encounter (Signed)
 Copied from CRM [id number]. Topic: Clinical - Prescription Issue >> May 20, 2024  3:46 PM Berwyn MATSU wrote: Reason for CRM:  [facility] pharmacy called in requesting to have prescription redirected to the below.   tirzepatide  (MOUNJARO ) 5 MG/0.5ML Pen  [facility] - [place], [place] [address]  Phone: [phone] Fax: [phone]  CB# [phone]   May you please assist.

## 2024-05-27 ENCOUNTER — Encounter: Payer: Self-pay | Admitting: Family Medicine

## 2024-05-27 NOTE — Progress Notes (Signed)
 Glenda Thompson     MRN: [id number]      DOB: 03-09-1971  Chief Complaint  Patient presents with   Medical Management of Chronic Issues    8 week follow up     HPI Glenda Thompson is here for follow up and re-evaluation of chronic medical conditions, medication management and review of any available recent lab and radiology data.  Preventive health is updated, specifically  Cancer screening and Immunization.   Questions or concerns regarding consultations or procedures which the PT has had in the interim are  addressed. C/o excessive nausea , intolerant of higher dose mounjaro  ROS Denies recent fever or chills. Denies sinus pressure, nasal congestion, ear pain or sore throat. Denies chest congestion, productive cough or wheezing. Denies chest pains, palpitations and leg swelling .   Denies dysuria, frequency, hesitancy or incontinence. Denies joint pain, swelling and limitation in mobility. Denies headaches, seizures, numbness, or tingling. Denies depression, anxiety or insomnia. Denies skin break down or rash.   PE  BP (!) 142/79   Pulse 70   Resp 16   Ht 5' 5 (1.651 m)   Wt 199 lb 1.3 oz (90.3 kg)   LMP 11/02/2017   SpO2 98%   BMI 33.13 kg/m   Patient alert and oriented and in no cardiopulmonary distress.  HEENT: No facial asymmetry, EOMI,     Neck supple .  Chest: Clear to auscultation bilaterally.  CVS: S1, S2 no murmurs, no S3.Regular rate.  ABD: Soft non tender.   Ext: No edema  MS: Adequate ROM spine, shoulders, hips and knees.  Skin: Intact, no ulcerations or rash noted.  Psych: Good eye contact, normal affect. Memory intact not anxious or depressed appearing.  CNS: CN 2-12 intact, power,  normal throughout.no focal deficits noted.   Assessment & Plan  Essential hypertension, benign Uncontrolled , add olmesartan /hydrochlorothiazide  and re eval in 8 weeks DASH diet and commitment to daily physical activity for a minimum of 30 minutes discussed  and encouraged, as a part of hypertension management. The importance of attaining a healthy weight is also discussed.     05/15/2024    4:36 PM 03/21/2024    5:01 PM 03/21/2024    4:42 PM 12/20/2023    3:52 PM 11/15/2023    8:33 AM 11/15/2023    8:32 AM 09/07/2023    1:39 PM  BP/Weight  Systolic BP 142 138 154 161 137 141 116  Diastolic BP 79 80 83 83 82 79 61  Wt. (Lbs) 199.08  193.04   201.08   BMI 33.13 kg/m2  32.12 kg/m2   33.46 kg/m2        Mixed hyperlipidemia Hyperlipidemia:Low fat diet discussed and encouraged.   Lipid Panel  Lab Results  Component Value Date   CHOL 148 04/02/2024   HDL 67 04/02/2024   LDLCALC 67 04/02/2024   TRIG 56 04/02/2024   CHOLHDL 2.2 04/02/2024     Controlled, no change in medication   Type 2 diabetes mellitus with other specified complication (HCC) Diabetes associated with hypertension, hyperlipidemia, and obesity  [name] is reminded of the importance of commitment to daily physical activity for 30 minutes or more, as able and the need to limit carbohydrate intake to 30 to 60 grams per meal to help with blood sugar control.   The need to take medication as prescribed, test blood sugar as directed, and to call between visits if there is a concern that blood sugar is uncontrolled is also  discussed.   [name] is reminded of the importance of daily foot exam, annual eye examination, and good blood sugar, blood pressure and cholesterol control.     Latest Ref Rng & Units 04/02/2024    9:52 AM 11/15/2023    9:30 AM 06/27/2023   11:26 AM 06/27/2023   11:23 AM 01/03/2023    5:00 PM  Diabetic Labs  HbA1c <5.7 % 5.9  5.7  6.0     Microalbumin mg/dL 0.5       Micro/Creat Ratio <30 mg/g creat 3     <3   Chol <200 mg/dL 851  791  821     HDL > OR = 50 mg/dL 67  62  60     Calc LDL mg/dL (calc) 67  869  898     Triglycerides <150 mg/dL 56  65  93     Creatinine 0.50 - 1.03 mg/dL 8.90  9.01  8.93  9.00        05/15/2024    4:36 PM  03/21/2024    5:01 PM 03/21/2024    4:42 PM 12/20/2023    3:52 PM 11/15/2023    8:33 AM 11/15/2023    8:32 AM 09/07/2023    1:39 PM  BP/Weight  Systolic BP 142 138 154 161 137 141 116  Diastolic BP 79 80 83 83 82 79 61  Wt. (Lbs) 199.08  193.04   201.08   BMI 33.13 kg/m2  32.12 kg/m2   33.46 kg/m2       Latest Ref Rng & Units 03/18/2022    4:00 PM 03/03/2022   12:00 AM  Foot/eye exam completion dates  Eye Exam No Retinopathy  No Retinopathy      Foot Form Completion  Done      This result is from an external source.      Lower dose of mounjaro   Obesity (BMI 30.0-34.9)  Patient re-educated about  the importance of commitment to a  minimum of 150 minutes of exercise per week as able.  The importance of healthy food choices with portion control discussed, as well as eating regularly and within a 12 hour window most days. The need to choose clean , green food 50 to 75% of the time is discussed, as well as to make water  the primary drink and set a goal of 64 ounces water  daily.       05/15/2024    4:36 PM 03/21/2024    4:42 PM 11/15/2023    8:32 AM  Weight /BMI  Weight 199 lb 1.3 oz 193 lb 0.6 oz 201 lb 1.3 oz  Height 5' 5 (1.651 m) 5' 5 (1.651 m) 5' 5 (1.651 m)  BMI 33.13 kg/m2 32.12 kg/m2 33.46 kg/m2    Weight gain needs to re commit to consistent lifestyle change to lower weight

## 2024-05-27 NOTE — Assessment & Plan Note (Signed)
  Patient re-educated about  the importance of commitment to a  minimum of 150 minutes of exercise per week as able.  The importance of healthy food choices with portion control discussed, as well as eating regularly and within a 12 hour window most days. The need to choose clean , green food 50 to 75% of the time is discussed, as well as to make water  the primary drink and set a goal of 64 ounces water  daily.       05/15/2024    4:36 PM 03/21/2024    4:42 PM 11/15/2023    8:32 AM  Weight /BMI  Weight 199 lb 1.3 oz 193 lb 0.6 oz 201 lb 1.3 oz  Height 5' 5 (1.651 m) 5' 5 (1.651 m) 5' 5 (1.651 m)  BMI 33.13 kg/m2 32.12 kg/m2 33.46 kg/m2    Weight gain needs to re commit to consistent lifestyle change to lower weight

## 2024-05-27 NOTE — Assessment & Plan Note (Signed)
 Hyperlipidemia:Low fat diet discussed and encouraged.   Lipid Panel  Lab Results  Component Value Date   CHOL 148 04/02/2024   HDL 67 04/02/2024   LDLCALC 67 04/02/2024   TRIG 56 04/02/2024   CHOLHDL 2.2 04/02/2024     Controlled, no change in medication

## 2024-05-27 NOTE — Assessment & Plan Note (Signed)
 Diabetes associated with hypertension, hyperlipidemia, and obesity  Glenda Thompson is reminded of the importance of commitment to daily physical activity for 30 minutes or more, as able and the need to limit carbohydrate intake to 30 to 60 grams per meal to help with blood sugar control.   The need to take medication as prescribed, test blood sugar as directed, and to call between visits if there is a concern that blood sugar is uncontrolled is also discussed.   Glenda Thompson is reminded of the importance of daily foot exam, annual eye examination, and good blood sugar, blood pressure and cholesterol control.     Latest Ref Rng & Units 04/02/2024    9:52 AM 11/15/2023    9:30 AM 06/27/2023   11:26 AM 06/27/2023   11:23 AM 01/03/2023    5:00 PM  Diabetic Labs  HbA1c <5.7 % 5.9  5.7  6.0     Microalbumin mg/dL 0.5       Micro/Creat Ratio <30 mg/g creat 3     <3   Chol <200 mg/dL 851  791  821     HDL > OR = 50 mg/dL 67  62  60     Calc LDL mg/dL (calc) 67  869  898     Triglycerides <150 mg/dL 56  65  93     Creatinine 0.50 - 1.03 mg/dL 8.90  9.01  8.93  9.00        05/15/2024    4:36 PM 03/21/2024    5:01 PM 03/21/2024    4:42 PM 12/20/2023    3:52 PM 11/15/2023    8:33 AM 11/15/2023    8:32 AM 09/07/2023    1:39 PM  BP/Weight  Systolic BP 142 138 154 161 137 141 116  Diastolic BP 79 80 83 83 82 79 61  Wt. (Lbs) 199.08  193.04   201.08   BMI 33.13 kg/m2  32.12 kg/m2   33.46 kg/m2       Latest Ref Rng & Units 03/18/2022    4:00 PM 03/03/2022   12:00 AM  Foot/eye exam completion dates  Eye Exam No Retinopathy  No Retinopathy      Foot Form Completion  Done      This result is from an external source.      Lower dose of mounjaro 

## 2024-05-27 NOTE — Assessment & Plan Note (Signed)
 Uncontrolled , add olmesartan /hydrochlorothiazide  and re eval in 8 weeks DASH diet and commitment to daily physical activity for a minimum of 30 minutes discussed and encouraged, as a part of hypertension management. The importance of attaining a healthy weight is also discussed.     05/15/2024    4:36 PM 03/21/2024    5:01 PM 03/21/2024    4:42 PM 12/20/2023    3:52 PM 11/15/2023    8:33 AM 11/15/2023    8:32 AM 09/07/2023    1:39 PM  BP/Weight  Systolic BP 142 138 154 161 137 141 116  Diastolic BP 79 80 83 83 82 79 61  Wt. (Lbs) 199.08  193.04   201.08   BMI 33.13 kg/m2  32.12 kg/m2   33.46 kg/m2

## 2024-06-19 ENCOUNTER — Encounter: Payer: Self-pay | Admitting: Family Medicine

## 2024-06-19 ENCOUNTER — Ambulatory Visit: Admitting: Family Medicine

## 2024-06-19 VITALS — BP 127/77 | HR 74 | Resp 16 | Ht 65.0 in | Wt 194.0 lb

## 2024-06-19 DIAGNOSIS — E1169 Type 2 diabetes mellitus with other specified complication: Secondary | ICD-10-CM

## 2024-06-19 DIAGNOSIS — E559 Vitamin D deficiency, unspecified: Secondary | ICD-10-CM

## 2024-06-19 DIAGNOSIS — E66811 Obesity, class 1: Secondary | ICD-10-CM

## 2024-06-19 DIAGNOSIS — E782 Mixed hyperlipidemia: Secondary | ICD-10-CM | POA: Diagnosis not present

## 2024-06-19 DIAGNOSIS — I10 Essential (primary) hypertension: Secondary | ICD-10-CM | POA: Diagnosis not present

## 2024-06-19 DIAGNOSIS — Z23 Encounter for immunization: Secondary | ICD-10-CM | POA: Diagnosis not present

## 2024-06-19 DIAGNOSIS — J309 Allergic rhinitis, unspecified: Secondary | ICD-10-CM | POA: Insufficient documentation

## 2024-06-19 MED ORDER — OLMESARTAN MEDOXOMIL-HCTZ 40-12.5 MG PO TABS: 1.0000 | ORAL_TABLET | Freq: Every day | ORAL | 3 refills | Status: DC

## 2024-06-19 MED ORDER — MONTELUKAST SODIUM 10 MG PO TABS: 10.0000 mg | ORAL_TABLET | Freq: Every day | ORAL | 3 refills | Status: AC

## 2024-06-19 NOTE — Assessment & Plan Note (Signed)
 Currently uncontrolled symptoms, little response to flonase , start singulair 

## 2024-06-19 NOTE — Patient Instructions (Addendum)
 F/u in mid January  Hep B#1 today  Hep B#2 and flu vaccine in 2 months  NEW for blood pressure ONE tablet only which is a combination of the 2 you have been taking, olmesartan /hydrochlorothiazide  40/12.5 ONE daily  Hep B#3  in 6 months  Fasting lipid, cmp and EGFr, hBA1C, TSHn and vit D early Jauary, 5 to 7 days before next appt  It is important that you exercise regularly at least 30 minutes 5 times a week. If you develop chest pain, have severe difficulty breathing, or feel very tired, stop exercising immediately and seek medical attention    Thanks for choosing  [facility], we consider it a privelige to serve you.

## 2024-06-19 NOTE — Assessment & Plan Note (Signed)
 Updated lab needed at/ before next visit.

## 2024-06-19 NOTE — Assessment & Plan Note (Signed)
 Controlled, no change in medication DASH diet and commitment to daily physical activity for a minimum of 30 minutes discussed and encouraged, as a part of hypertension management. The importance of attaining a healthy weight is also discussed.     06/19/2024    4:15 PM 05/15/2024    4:36 PM 03/21/2024    5:01 PM 03/21/2024    4:42 PM 12/20/2023    3:52 PM 11/15/2023    8:33 AM 11/15/2023    8:32 AM  BP/Weight  Systolic BP 127 142 138 154 161 137 141  Diastolic BP 77 79 80 83 83 82 79  Wt. (Lbs) 194.04 199.08  193.04   201.08  BMI 32.29 kg/m2 33.13 kg/m2  32.12 kg/m2   33.46 kg/m2

## 2024-06-19 NOTE — Assessment & Plan Note (Signed)
 After obtaining informed consent, the Hep B #1 vaccine is  administered , with no adverse effect noted at the time of administration.

## 2024-06-19 NOTE — Assessment & Plan Note (Signed)
  Patient re-educated about  the importance of commitment to a  minimum of 150 minutes of exercise per week as able.  The importance of healthy food choices with portion control discussed, as well as eating regularly and within a 12 hour window most days. The need to choose clean , green food 50 to 75% of the time is discussed, as well as to make water  the primary drink and set a goal of 64 ounces water  daily.       06/19/2024    4:15 PM 05/15/2024    4:36 PM 03/21/2024    4:42 PM  Weight /BMI  Weight 194 lb 0.6 oz 199 lb 1.3 oz 193 lb 0.6 oz  Height 5' 5 (1.651 m) 5' 5 (1.651 m) 5' 5 (1.651 m)  BMI 32.29 kg/m2 33.13 kg/m2 32.12 kg/m2    Improved continue same meds

## 2024-06-19 NOTE — Progress Notes (Signed)
 Glenda Thompson     MRN: [id number]      DOB: May 10, 1971  Chief Complaint  Patient presents with   Medical Management of Chronic Issues    Follow up     HPI Glenda Thompson is here for follow up and re-evaluation of chronic medical conditions, specifically uncontrolled hypertension with recent med adjusment, medication management and review of any available recent lab and radiology data.  Preventive health is updated, specifically  Cancer screening and Immunization.   Questions or concerns regarding consultations or procedures which the PT has had in the interim are  addressed. The PT denies any adverse reactions to current medications since the last visit.   left sciatic flare in past 2 days , responded to tylenol  650 mg x 2 tabs Uncontrolled and oincreased allergy symptoms of sinus and nasal congestion x  2 weeks ROS Denies recent fever or chills. Denies  ear pain or sore throat. Denies chest congestion, productive cough or wheezing. Denies chest pains, palpitations and leg swelling Denies abdominal pain, nausea, vomiting,diarrhea or constipation.   Denies dysuria, frequency, hesitancy or incontinence.  Denies headaches, seizures, numbness, or tingling. Denies depression, anxiety or insomnia. Denies skin break down or rash.   PE  BP 127/77   Pulse 74   Resp 16   Ht 5' 5 (1.651 m)   Wt 194 lb 0.6 oz (88 kg)   LMP 11/02/2017   SpO2 98%   BMI 32.29 kg/m   Patient alert and oriented and in no cardiopulmonary distress.  HEENT: No facial asymmetry, EOMI,     Neck supple .Nasal congestion, no sinus tenderness  Chest: Clear to auscultation bilaterally.  CVS: S1, S2 no murmurs, no S3.Regular rate.  ABD: Soft non tender.   Ext: No edema  FD:izrmzjdzi  ROM lumbar spine, and left leg  Skin: Intact, no ulcerations or rash noted.  Psych: Good eye contact, normal affect. Memory intact not anxious or depressed appearing.  CNS: CN 2-12 intact, power,  normal throughout.no  focal deficits noted.   Assessment & Plan  Essential hypertension, benign Controlled, no change in medication DASH diet and commitment to daily physical activity for a minimum of 30 minutes discussed and encouraged, as a part of hypertension management. The importance of attaining a healthy weight is also discussed.     06/19/2024    4:15 PM 05/15/2024    4:36 PM 03/21/2024    5:01 PM 03/21/2024    4:42 PM 12/20/2023    3:52 PM 11/15/2023    8:33 AM 11/15/2023    8:32 AM  BP/Weight  Systolic BP 127 142 138 154 161 137 141  Diastolic BP 77 79 80 83 83 82 79  Wt. (Lbs) 194.04 199.08  193.04   201.08  BMI 32.29 kg/m2 33.13 kg/m2  32.12 kg/m2   33.46 kg/m2       Immunization due After obtaining informed consent, the Hep B#1  vaccine is  administered , with no adverse effect noted at the time of administration.   Mixed hyperlipidemia Hyperlipidemia:Low fat diet discussed and encouraged.   Lipid Panel  Lab Results  Component Value Date   CHOL 148 04/02/2024   HDL 67 04/02/2024   LDLCALC 67 04/02/2024   TRIG 56 04/02/2024   CHOLHDL 2.2 04/02/2024     Controlled, no change in medication Updated lab needed at/ before next visit.   Obesity (BMI 30.0-34.9)  Patient re-educated about  the importance of commitment to a  minimum of 150  minutes of exercise per week as able.  The importance of healthy food choices with portion control discussed, as well as eating regularly and within a 12 hour window most days. The need to choose clean , green food 50 to 75% of the time is discussed, as well as to make water  the primary drink and set a goal of 64 ounces water  daily.       06/19/2024    4:15 PM 05/15/2024    4:36 PM 03/21/2024    4:42 PM  Weight /BMI  Weight 194 lb 0.6 oz 199 lb 1.3 oz 193 lb 0.6 oz  Height 5' 5 (1.651 m) 5' 5 (1.651 m) 5' 5 (1.651 m)  BMI 32.29 kg/m2 33.13 kg/m2 32.12 kg/m2    Improved continue same meds  Vitamin D  deficiency Updated lab needed  at/ before next visit.   Allergic rhinosinusitis Currently uncontrolled symptoms, little response to flonase , start singulair 

## 2024-06-19 NOTE — Assessment & Plan Note (Signed)
 Hyperlipidemia:Low fat diet discussed and encouraged.   Lipid Panel  Lab Results  Component Value Date   CHOL 148 04/02/2024   HDL 67 04/02/2024   LDLCALC 67 04/02/2024   TRIG 56 04/02/2024   CHOLHDL 2.2 04/02/2024     Controlled, no change in medication Updated lab needed at/ before next visit.

## 2024-06-20 ENCOUNTER — Ambulatory Visit (HOSPITAL_COMMUNITY): Payer: BC Managed Care – PPO

## 2024-06-28 ENCOUNTER — Ambulatory Visit (HOSPITAL_COMMUNITY)

## 2024-07-18 ENCOUNTER — Other Ambulatory Visit: Payer: Self-pay | Admitting: Family Medicine

## 2024-08-06 ENCOUNTER — Ambulatory Visit: Payer: Self-pay

## 2024-08-06 ENCOUNTER — Other Ambulatory Visit: Payer: Self-pay | Admitting: Internal Medicine

## 2024-08-06 DIAGNOSIS — K219 Gastro-esophageal reflux disease without esophagitis: Secondary | ICD-10-CM

## 2024-08-06 DIAGNOSIS — K5909 Other constipation: Secondary | ICD-10-CM

## 2024-08-06 NOTE — Telephone Encounter (Signed)
 Please add her to patel's 4:00 tomorrow per Dr. Tobie

## 2024-08-06 NOTE — Telephone Encounter (Signed)
 Appointment scheduled.

## 2024-08-06 NOTE — Telephone Encounter (Signed)
 GI. Complaints for over a year--been assessed and nothing diagnosed--pt states an episode at 4am today and she still had a little pain in her abdomen.  FYI Only or Action Required?: Action required by provider: request for appointment and referral request.  Patient was last seen in primary care on 06/19/2024 by Antonetta Rollene BRAVO, MD.  Called Nurse Triage reporting No chief complaint on file..  Symptoms began over a year ago--off and on but today's episode was at 4am and .  Interventions attempted: OTC medications: Tylenol , Rest, hydration, or home remedies, and Dietary changes.  Symptoms are: unchanged.  Triage Disposition: See HCP Within 4 Hours (Or PCP Triage)  Patient/caregiver understands and will follow disposition?: Yes, but will wait--Patient states she gets off work at 4pm and will go to Urgent Care after that         Copied from CRM #[id number]. Topic: Clinical - Red Word Triage >> Aug 06, 2024  9:04 AM Precious C wrote: [facility] that prompted transfer to Nurse Triage: LOSS OF CONSCIOUSNESS  Pt called in regards to stomach pains which cause pt to lose consciousness, she also stated she has acid that comes up as well. Reason for Disposition . [1] MILD-MODERATE pain AND [2] constant AND [3] present > 2 hours  Answer Assessment - Initial Assessment Questions 4am today patient went to the bathroom and had an upset stomach This happens once a month per patient Patient states that her significant other would accompany her to the restroom because of bearing down so hard she could possibly have a fainting episode She states that she has been seen and checked out for this numerous times and nothing has ever been diagnosed as a cause She states that she strains and has passed out while attempting to have a bowel movement in the past. She is at work currently but wanted to get something checked out and states that she tasted acid last night and wanted to know about acid reflux  medication With no openings with her provider or provider's office and no openings in the region after 4pm today,--patient is advised that it is recommended to be seen in the next four hours if possible. Patient states she will go to Urgent Care if after work today She also wanted to know about a G.I. referral from her PCP  Patient took two gummy probiotics yesterday Patient is advised to call us  back if anything changes or with any further questions/concerns. Patient is advised that if anything worsens to go to the Emergency Room. Patient verbalized understanding.   1. LOCATION: Where does it hurt?      abdomen 2. RADIATION: Does the pain shoot anywhere else? (e.g., chest, back)     ------ 3. ONSET: When did the pain begin? (e.g., minutes, hours or days ago)      A year ago 4. SUDDEN: Gradual or sudden onset?     gradual 5. PATTERN Does the pain come and go, or is it constant?     Comes and goes 6. SEVERITY: How bad is the pain?  (e.g., Scale 1-10; mild, moderate, or severe)     A little bit but not like it did 7. RECURRENT SYMPTOM: Have you ever had this type of stomach pain before? If Yes, ask: When was the last time? and What happened that time?      Yes---once or twice a month 8. CAUSE: What do you think is causing the stomach pain? (e.g., gallstones, recent abdominal surgery)  unknown 9. RELIEVING/AGGRAVATING FACTORS: What makes it better or worse? (e.g., antacids, bending or twisting motion, bowel movement)     ----- 10. OTHER SYMPTOMS: Do you have any other symptoms? (e.g., back pain, diarrhea, fever, urination pain, vomiting)       vomiting 11. PREGNANCY: Is there any chance you are pregnant? When was your last menstrual period?       No  Protocols used: Abdominal Pain - Female-A-AH

## 2024-08-07 ENCOUNTER — Ambulatory Visit: Admitting: Internal Medicine

## 2024-08-07 ENCOUNTER — Other Ambulatory Visit: Payer: Self-pay | Admitting: Internal Medicine

## 2024-08-07 ENCOUNTER — Encounter: Payer: Self-pay | Admitting: Internal Medicine

## 2024-08-07 VITALS — BP 131/71 | HR 73 | Resp 16 | Ht 65.0 in | Wt 192.2 lb

## 2024-08-07 DIAGNOSIS — E1169 Type 2 diabetes mellitus with other specified complication: Secondary | ICD-10-CM

## 2024-08-07 DIAGNOSIS — K219 Gastro-esophageal reflux disease without esophagitis: Secondary | ICD-10-CM | POA: Diagnosis not present

## 2024-08-07 DIAGNOSIS — Z7985 Long-term (current) use of injectable non-insulin antidiabetic drugs: Secondary | ICD-10-CM

## 2024-08-07 DIAGNOSIS — K5904 Chronic idiopathic constipation: Secondary | ICD-10-CM

## 2024-08-07 DIAGNOSIS — R55 Syncope and collapse: Secondary | ICD-10-CM

## 2024-08-07 DIAGNOSIS — E162 Hypoglycemia, unspecified: Secondary | ICD-10-CM | POA: Insufficient documentation

## 2024-08-07 MED ORDER — FREESTYLE LIBRE 3 READER DEVI: 0 refills | Status: DC

## 2024-08-07 MED ORDER — OMEPRAZOLE 40 MG PO CPDR: 40.0000 mg | DELAYED_RELEASE_CAPSULE | Freq: Every day | ORAL | 3 refills | Status: DC

## 2024-08-07 MED ORDER — FREESTYLE LIBRE 3 PLUS SENSOR MISC: 3 refills | Status: DC

## 2024-08-07 NOTE — Assessment & Plan Note (Signed)
 Lab Results  Component Value Date   HGBA1C 5.9 (H) 04/02/2024   Well-controlled Associated with HTN and HLD On Mounjaro  5 mg QW Considering her episodes of hypoglycemia, up to 50s -prescribed freestyle libre 3+ Advised to follow diabetic diet On statin F/u CMP and lipid panel

## 2024-08-07 NOTE — Assessment & Plan Note (Signed)
 Her episodes of dizziness, flushing, nausea and palpitations with defecation likely due to vasovagal syncope Advised to avoid straining while defecation Avoid sudden positional changes Advised to maintain at least 64 ounces of fluid intake in a day - BP wnl today Advised to eat at regular intervals to avoid hypoglycemia Has a cardiac and neurology workup in the past

## 2024-08-07 NOTE — Assessment & Plan Note (Signed)
 Episodes of hypoglycemia likely due to her dietary patterns Needs to eat at regular intervals - needs to follow small, frequent meals She would benefit from CGM-freestyle libre 3+ prescribed

## 2024-08-07 NOTE — Assessment & Plan Note (Addendum)
 Her epigastric burning with spicy food is likely due to GERD/gastritis Last EGD reviewed Started omeprazole 40 mg QD Advised to avoid hot and spicy food for now Referred to GI

## 2024-08-07 NOTE — Patient Instructions (Signed)
 Please start taking Omeprazole for acid reflux.  Please avoid hot and spicy food.  Please start taking Senokot-S twice daily for constipation. Okay to take Miralax  for persistent constipation.

## 2024-08-07 NOTE — Assessment & Plan Note (Addendum)
 Has chronic constipation Advised to take Senokot-S twice daily for constipation Can take MiraLAX  as needed for persistent constipation Advised to maintain at least 64 ounces of fluid intake in a day Can try prune juice as needed for constipation Referred to GI

## 2024-08-07 NOTE — Progress Notes (Signed)
 Acute Office Visit  Subjective:    Patient ID: Glenda Thompson, female    DOB: [birth year], 53 y.o.   MRN: [id number]  Chief Complaint  Patient presents with   Abdominal Pain    Has been having episodes for years of abdominal pain. Starts hurting right above her belly button sometimes after eating spicy food but sometimes its random. Will try to use the restroom and will have sweats and sugar drops and gets cold. Those episodes will last about 10 mins. Wants referral to [facility] because she had similar symptoms in 2003 and ended up in the hospital from it    HPI Patient is in today for complaint of epigastric pain, especially after eating spicy food.  She has noticed burning sensation in epigastric area with ketchup, citrus fruits and hot food as well.  She has had EGD in 11/24, which showed mildly erythematous mucosa within the stomach.  She also reports episodes of constipation, where she has to strain and has had vasovagal syncope episodes after straining.  Denies any melena or hematochezia.  She has a history of vasovagal syncope, which has required hospitalization in the past.  She reports feeling flushed, nauseous and has palpitations during these episodes.  Of note, she also reports episodes of hypoglycemia, up to 50s at times.  Of note, she has prolonged periods where she does not get to eat due to her 2 jobs.  She is currently on Mounjaro  5 mg QW for type II DM.  Past Medical History:  Diagnosis Date   Arthritis    Diabetes mellitus    Diabetes mellitus without complication (HCC)    Phreesia 01/19/2021   Hyperlipidemia    Hypertension    Syncope 03/08/2022    Past Surgical History:  Procedure Laterality Date   BIOPSY  01/22/2021   Procedure: BIOPSY;  Surgeon: Eartha Angelia Sieving, MD;  [facility];  Service: Gastroenterology;;   BIOPSY  09/07/2023   Procedure: BIOPSY;  Surgeon: Cinderella Deatrice FALCON, MD;  [facility];  Service: Endoscopy;;   CESAREAN  SECTION     COLONOSCOPY  01/22/2021   COLONOSCOPY WITH PROPOFOL  N/A 01/22/2021   Procedure: COLONOSCOPY WITH PROPOFOL ;  Surgeon: Eartha Angelia Sieving, MD;  [facility];  Service: Gastroenterology;  Laterality: N/A;  Am   COLONOSCOPY WITH PROPOFOL  N/A 09/07/2023   Procedure: COLONOSCOPY WITH PROPOFOL ;  Surgeon: Cinderella Deatrice FALCON, MD;  [facility];  Service: Endoscopy;  Laterality: N/A;  1:15pm;asa 3   ESOPHAGOGASTRODUODENOSCOPY (EGD) WITH PROPOFOL  N/A 09/07/2023   Procedure: ESOPHAGOGASTRODUODENOSCOPY (EGD) WITH PROPOFOL ;  Surgeon: Cinderella Deatrice FALCON, MD;  [facility];  Service: Endoscopy;  Laterality: N/A;  1:15pm;asa 3   TUBAL LIGATION  2003    Family History  Problem Relation Age of Onset   Hypertension Mother    Diabetes Mother    Hyperlipidemia Mother    Diabetes Brother    Miscarriages / Stillbirths Maternal Aunt     Social History   Socioeconomic History   Marital status: Married    Spouse name: Not on file   Number of children: Not on file   Years of education: Not on file   Highest education level: 12th grade  Occupational History   Not on file  Tobacco Use   Smoking status: Never   Smokeless tobacco: Never  Vaping Use   Vaping status: Never Used  Substance and Sexual Activity   Alcohol use: Never   Drug use: Never  Sexual activity: Not on file  Other Topics Concern   Not on file  Social History Narrative   Not on file   Social Drivers of Health   Financial Resource Strain: Low Risk  (05/15/2024)   Overall Financial Resource Strain (CARDIA)    Difficulty of Paying Living Expenses: Not very hard  Food Insecurity: No Food Insecurity (05/15/2024)   Hunger Vital Sign    Worried About Running Out of Food in the Last Year: Never true    Ran Out of Food in the Last Year: Never true  Transportation Needs: No Transportation Needs (05/15/2024)   PRAPARE - Administrator, Civil Service (Medical): No    Lack of  Transportation (Non-Medical): No  Physical Activity: Sufficiently Active (05/15/2024)   Exercise Vital Sign    Days of Exercise per Week: 7 days    Minutes of Exercise per Session: 30 min  Stress: No Stress Concern Present (05/15/2024)   [employer] of Occupational Health - Occupational Stress Questionnaire    Feeling of Stress: Not at all  Social Connections: Socially Integrated (05/15/2024)   Social Connection and Isolation Panel    Frequency of Communication with Friends and Family: More than three times a week    Frequency of Social Gatherings with Friends and Family: Twice a week    Attends Religious Services: More than 4 times per year    Active Member of [employer] or Organizations: Yes    Attends Banker Meetings: 1 to 4 times per year    Marital Status: Married  Catering manager Violence: Unknown (02/01/2023)   Received from [facility]   HITS    Physically Hurt: Not on file    Insult or Talk Down To: Not on file    Threaten Physical Harm: Not on file    Scream or Curse: Not on file    Outpatient Medications Prior to Visit  Medication Sig Dispense Refill   amLODipine  (NORVASC ) 10 MG tablet TAKE 1 TABLET(10 MG) BY MOUTH DAILY 90 tablet 1   fluticasone  (FLONASE ) 50 MCG/ACT nasal spray Place 1 spray into both nostrils 2 (two) times daily. 16 g 2   Insulin  Pen Needle (B-D ULTRAFINE III SHORT PEN) 31G X 8 MM MISC Use as directed to inject insulin  daily. 100 each 2   montelukast  (SINGULAIR ) 10 MG tablet Take 1 tablet (10 mg total) by mouth at bedtime. 30 tablet 3   olmesartan -hydrochlorothiazide  (BENICAR  HCT) 40-12.5 MG tablet Take 1 tablet by mouth daily. 90 tablet 3   rosuvastatin  (CRESTOR ) 5 MG tablet Take 1 tablet (5 mg total) by mouth daily. 90 tablet 1   tirzepatide  (MOUNJARO ) 5 MG/0.5ML Pen Inject 5 mg into the skin once a week. 6 mL 1   Continuous Blood Gluc Receiver (FREESTYLE LIBRE 2 READER) DEVI To check glucose dx e11.9 1 each 0   Continuous Blood Gluc  Sensor (FREESTYLE LIBRE 2 SENSOR) MISC TO CHECK GLUCOSE DX E11.9 2 each 5   No facility-administered medications prior to visit.    No Known Allergies  Review of Systems  Constitutional:  Negative for chills and fever.  HENT:  Negative for congestion, sinus pressure, sinus pain and sore throat.   Eyes:  Negative for pain and discharge.  Respiratory:  Negative for cough and shortness of breath.   Cardiovascular:  Negative for chest pain and palpitations.  Gastrointestinal:  Positive for abdominal pain and constipation. Negative for diarrhea, nausea and vomiting.  Endocrine: Negative for polydipsia and polyuria.  Genitourinary:  Negative for dysuria and hematuria.  Musculoskeletal:  Negative for neck pain and neck stiffness.  Skin:  Negative for rash.  Neurological:  Positive for syncope. Negative for weakness and headaches.  Psychiatric/Behavioral:  Negative for agitation and behavioral problems.        Objective:    Physical Exam Vitals reviewed.  Constitutional:      General: She is not in acute distress.    Appearance: She is obese. She is not diaphoretic.  HENT:     Head: Normocephalic and atraumatic.     Nose: Nose normal.     Mouth/Throat:     Mouth: Mucous membranes are moist.  Eyes:     General: No scleral icterus.    Extraocular Movements: Extraocular movements intact.  Cardiovascular:     Rate and Rhythm: Normal rate and regular rhythm.     Heart sounds: Normal heart sounds. No murmur heard. Pulmonary:     Breath sounds: Normal breath sounds. No wheezing or rales.  Abdominal:     Palpations: Abdomen is soft.     Tenderness: There is abdominal tenderness (Epigastric).  Musculoskeletal:     Cervical back: Neck supple. No tenderness.     Right lower leg: No edema.     Left lower leg: No edema.  Skin:    General: Skin is warm.     Findings: No rash.  Neurological:     General: No focal deficit present.     Mental Status: She is alert and oriented to person,  place, and time.  Psychiatric:        Mood and Affect: Mood normal.        Behavior: Behavior normal.     BP 131/71   Pulse 73   Resp 16   Ht 5' 5 (1.651 m)   Wt 192 lb 3.2 oz (87.2 kg)   LMP 11/02/2017   SpO2 97%   BMI 31.98 kg/m  Wt Readings from Last 3 Encounters:  08/07/24 192 lb 3.2 oz (87.2 kg)  06/19/24 194 lb 0.6 oz (88 kg)  05/15/24 199 lb 1.3 oz (90.3 kg)        Assessment & Plan:   Problem List Items Addressed This Visit       Digestive   Gastroesophageal reflux disease - Primary   Her epigastric burning with spicy food is likely due to GERD/gastritis Last EGD reviewed Started omeprazole 40 mg QD Advised to avoid hot and spicy food for now Referred to GI      Relevant Medications   omeprazole (PRILOSEC) 40 MG capsule     Endocrine   Type 2 diabetes mellitus with other specified complication (HCC)   Lab Results  Component Value Date   HGBA1C 5.9 (H) 04/02/2024   Well-controlled Associated with HTN and HLD On Mounjaro  5 mg QW Considering her episodes of hypoglycemia, up to 50s -prescribed freestyle libre 3+ Advised to follow diabetic diet On statin F/u CMP and lipid panel      Relevant Medications   Continuous Glucose Receiver (FREESTYLE LIBRE 3 READER) DEVI   Continuous Glucose Sensor (FREESTYLE LIBRE 3 PLUS SENSOR) MISC   Hypoglycemia   Episodes of hypoglycemia likely due to her dietary patterns Needs to eat at regular intervals - needs to follow small, frequent meals She would benefit from CGM-freestyle libre 3+ prescribed      Relevant Medications   Continuous Glucose Receiver (FREESTYLE LIBRE 3 READER) DEVI   Continuous Glucose Sensor (FREESTYLE LIBRE 3 PLUS SENSOR)  MISC     Other   Chronic constipation   Has chronic constipation Advised to take Senokot-S twice daily for constipation Can take MiraLAX  as needed for persistent constipation Advised to maintain at least 64 ounces of fluid intake in a day Can try prune juice as  needed for constipation Referred to GI      Vasovagal syncope   Her episodes of dizziness, flushing, nausea and palpitations with defecation likely due to vasovagal syncope Advised to avoid straining while defecation Avoid sudden positional changes Advised to maintain at least 64 ounces of fluid intake in a day - BP wnl today Advised to eat at regular intervals to avoid hypoglycemia Has a cardiac and neurology workup in the past      Addendum: Had to switch from freestyle libre to [employer] G7 due to insurance preference.  Meds ordered this encounter  Medications   Continuous Glucose Receiver (FREESTYLE LIBRE 3 READER) DEVI    Sig: Use it to check blood glucose as instructed.    Dispense:  1 each    Refill:  0   Continuous Glucose Sensor (FREESTYLE LIBRE 3 PLUS SENSOR) MISC    Sig: Change sensor every 15 days.    Dispense:  6 each    Refill:  3   omeprazole (PRILOSEC) 40 MG capsule    Sig: Take 1 capsule (40 mg total) by mouth daily.    Dispense:  30 capsule    Refill:  3     Sebastin Perlmutter MARLA Blanch, MD

## 2024-08-08 ENCOUNTER — Other Ambulatory Visit (HOSPITAL_COMMUNITY): Payer: Self-pay

## 2024-08-08 MED ORDER — DEXCOM G7 RECEIVER DEVI: 0 refills | Status: DC

## 2024-08-08 MED ORDER — DEXCOM G7 SENSOR MISC: 5 refills | Status: DC

## 2024-08-08 NOTE — Addendum Note (Signed)
 Addended byBETHA TOBIE DOWNS on: 08/08/2024 02:55 PM   Modules accepted: Orders

## 2024-08-09 ENCOUNTER — Other Ambulatory Visit (HOSPITAL_COMMUNITY): Payer: Self-pay

## 2024-08-10 ENCOUNTER — Other Ambulatory Visit: Payer: Self-pay | Admitting: Family Medicine

## 2024-08-12 ENCOUNTER — Telehealth: Payer: Self-pay | Admitting: Family Medicine

## 2024-08-12 ENCOUNTER — Telehealth: Payer: Self-pay | Admitting: Pharmacy Technician

## 2024-08-12 ENCOUNTER — Other Ambulatory Visit (HOSPITAL_COMMUNITY): Payer: Self-pay

## 2024-08-12 NOTE — Telephone Encounter (Signed)
 Pharmacy Patient Advocate Encounter   Received notification from Onbase that prior authorization for Dexcom G7 Sensor is required/requested.   Insurance verification completed.   The patient is insured through [employer] .   Per test claim: PA required; PA submitted to above mentioned insurance via Latent Key/confirmation #/EOC [id number] Status is pending

## 2024-08-12 NOTE — Telephone Encounter (Signed)
 FMLA forms Glenda Thompson must be completely every 6 months.  Copied Noted Sleeved Original placed provider box Copy placed front desk folder  Fax to [phone] and call patient to pick up a copy when ready.

## 2024-08-12 NOTE — Telephone Encounter (Signed)
 Pharmacy Patient Advocate Encounter   Received notification from Onbase that prior authorization for Dexcom G7 Receiver  device is required/requested.   Insurance verification completed.   The patient is insured through [employer] [place] .   Per test claim: PA required; PA submitted to above mentioned insurance via Latent Key/confirmation #/EOC [facility] Status is pending

## 2024-08-13 ENCOUNTER — Other Ambulatory Visit (HOSPITAL_COMMUNITY): Payer: Self-pay

## 2024-08-13 NOTE — Telephone Encounter (Signed)
 Pharmacy Patient Advocate Encounter  Received notification from [employer] [place]  that Prior Authorization for Dexcom G7 Sensor has been APPROVED from 07/13/2024 to 08/12/2025. Unable to obtain price due to refill too soon rejection, last fill date 08/12/2024 next available fill date11/09/2024.   PA #/Case ID/Reference #: [id number]

## 2024-08-13 NOTE — Telephone Encounter (Signed)
 In patel form for signing

## 2024-08-13 NOTE — Telephone Encounter (Signed)
 Pharmacy Patient Advocate Encounter  Received notification from [employer] [place]  that Prior Authorization for [facility] G7 Receiver  device has been APPROVED from 07/13/2024 to 08/12/2025.   PA #/Case ID/Reference #: [id number]

## 2024-08-14 ENCOUNTER — Encounter (INDEPENDENT_AMBULATORY_CARE_PROVIDER_SITE_OTHER): Payer: Self-pay | Admitting: *Deleted

## 2024-08-14 NOTE — Telephone Encounter (Signed)
 Form is completed , left vm for pt letting her know she can come to pick up forms, they will be at the front desk. Copy sent to scan, copy also at my desk if needed.

## 2024-08-16 ENCOUNTER — Telehealth: Payer: Self-pay | Admitting: Family Medicine

## 2024-08-16 NOTE — Telephone Encounter (Signed)
 Patient came by and pick up her fmla forms she asked if nurse can fax office notes 08.27.2025 and 10.15.2025 to [facility] fax # [phone].her work is requiring notes now.

## 2024-08-16 NOTE — Telephone Encounter (Signed)
 Patient picked up forms.

## 2024-08-20 NOTE — Telephone Encounter (Signed)
 Faxed to [facility]

## 2024-08-21 ENCOUNTER — Ambulatory Visit (HOSPITAL_COMMUNITY)
Admission: RE | Admit: 2024-08-21 | Discharge: 2024-08-21 | Disposition: A | Discharge status: Home / Self Care | Source: Ambulatory Visit | Attending: Family Medicine | Admitting: Family Medicine

## 2024-08-21 DIAGNOSIS — Z1231 Encounter for screening mammogram for malignant neoplasm of breast: Secondary | ICD-10-CM | POA: Diagnosis not present

## 2024-08-26 ENCOUNTER — Ambulatory Visit: Admitting: Family Medicine

## 2024-09-02 ENCOUNTER — Telehealth (INDEPENDENT_AMBULATORY_CARE_PROVIDER_SITE_OTHER): Payer: Self-pay | Admitting: Gastroenterology

## 2024-09-02 ENCOUNTER — Ambulatory Visit (INDEPENDENT_AMBULATORY_CARE_PROVIDER_SITE_OTHER): Admitting: Gastroenterology

## 2024-09-02 ENCOUNTER — Encounter (INDEPENDENT_AMBULATORY_CARE_PROVIDER_SITE_OTHER): Payer: Self-pay | Admitting: Gastroenterology

## 2024-09-02 VITALS — BP 146/76 | HR 80 | Temp 97.2°F | Ht 65.0 in | Wt 195.2 lb

## 2024-09-02 DIAGNOSIS — K219 Gastro-esophageal reflux disease without esophagitis: Secondary | ICD-10-CM

## 2024-09-02 DIAGNOSIS — K581 Irritable bowel syndrome with constipation: Secondary | ICD-10-CM | POA: Diagnosis not present

## 2024-09-02 DIAGNOSIS — Z6834 Body mass index (BMI) 34.0-34.9, adult: Secondary | ICD-10-CM | POA: Diagnosis not present

## 2024-09-02 DIAGNOSIS — K5904 Chronic idiopathic constipation: Secondary | ICD-10-CM | POA: Diagnosis not present

## 2024-09-02 MED ORDER — TRULANCE 3 MG PO TABS: 3.0000 mg | ORAL_TABLET | Freq: Every day | ORAL | 1 refills | Status: AC

## 2024-09-02 MED ORDER — PEG 3350-KCL-NA BICARB-NACL 420 G PO SOLR: 4000.0000 mL | Freq: Once | ORAL | 0 refills | Status: AC

## 2024-09-02 MED ORDER — LINACLOTIDE 290 MCG PO CAPS: 290.0000 ug | ORAL_CAPSULE | Freq: Every day | ORAL | Status: DC

## 2024-09-02 NOTE — Progress Notes (Signed)
 Miller Edgington Faizan Teale Goodgame , M.D. Gastroenterology & Hepatology [facility] Orthopaedic [facility] [address] [place], [place] [address] Primary Care Physician: Antonetta Rollene BRAVO, MD [address] [place] [address]  Chief Complaint: Chronic constipation,  GERD   History of Present Illness: Glenda Thompson is a 53 y.o. female with DM, HLD, HTN, obesity  who presents for evaluation of Chronic constipation, abdominal pain and GERD  Patient was last seen 06/2023 for abdominal pain with colitis and constipation previously underwent upper endoscopy and colonoscopy which was essentially negative  Patient main complaint remains constipation at this time.  She would have 1-2 bowel movement per month on Bristol stool scale type I and II  Patient reports recently she has severe epigastric pain mostly after eating fatty or spicy.  The patient denies having any fever, chills, hematochezia, melena, hematemesis, , diarrhea, jaundice, pruritus or weight loss.   Labs from 03/2024 normal liver enzymes hemoglobin 11.7 platelets 258 hemoglobin A1c 5.9 Last EGD:08/2023  - Erythematous mucosa in the stomach. Biopsied. - Normal duodenal bulb, second portion of the duodenum and third portion of the duodenum.  A. STOMACH, BIOPSY:  Gastric antral mucosa with hyperemia.  Negative for Helicobacter pylori.   Last Colonoscopy:08/2023  - Diverticulosis in the left colon. - The examined portion of the ileum was normal. - Non- bleeding external and internal hemorrhoids. - No specimens collected.  Repeat 10 years   2020  - Altered vascular, congested, erythematous and ulcerated mucosa from 50 to 70 cm proximal to the anus. Possible ischemic colitis. Biopsied. - The distal rectum and anal verge are normal on retroflexion view.  Was given  ciprofloxacin  an Flagyl  and ciprofloxacin  for 7 days  Recs: Pathology showed presence of changes of colitis with ulceration, likely  related to ischemia, no cytopathic changes seen, repeat colonoscopy in 10 years.   Patient was prescribed antibiotic, Lab work from 12/2022 with normal liver enzymes hemoglobin 11.2 previous hemoglobin 12.9 in 02/2022  .FHx: neg for any gastrointestinal/liver disease, no malignancies Social: neg smoking, alcohol or illicit drug use Surgical: no abdominal surgeries  Past Medical History: Past Medical History:  Diagnosis Date   Arthritis    Diabetes mellitus    Diabetes mellitus without complication (HCC)    Phreesia 01/19/2021   Hyperlipidemia    Hypertension    Syncope 03/08/2022    Past Surgical History: Past Surgical History:  Procedure Laterality Date   BIOPSY  01/22/2021   Procedure: BIOPSY;  Surgeon: Eartha Angelia Sieving, MD;  [facility];  Service: Gastroenterology;;   BIOPSY  09/07/2023   Procedure: BIOPSY;  Surgeon: Cinderella Deatrice FALCON, MD;  [facility];  Service: Endoscopy;;   CESAREAN SECTION     COLONOSCOPY  01/22/2021   COLONOSCOPY WITH PROPOFOL  N/A 01/22/2021   Procedure: COLONOSCOPY WITH PROPOFOL ;  Surgeon: Eartha Angelia Sieving, MD;  [facility];  Service: Gastroenterology;  Laterality: N/A;  Am   COLONOSCOPY WITH PROPOFOL  N/A 09/07/2023   Procedure: COLONOSCOPY WITH PROPOFOL ;  Surgeon: Cinderella Deatrice FALCON, MD;  [facility];  Service: Endoscopy;  Laterality: N/A;  1:15pm;asa 3   ESOPHAGOGASTRODUODENOSCOPY (EGD) WITH PROPOFOL  N/A 09/07/2023   Procedure: ESOPHAGOGASTRODUODENOSCOPY (EGD) WITH PROPOFOL ;  Surgeon: Cinderella Deatrice FALCON, MD;  [facility];  Service: Endoscopy;  Laterality: N/A;  1:15pm;asa 3   TUBAL LIGATION  2003    Family History: Family History  Problem Relation Age of Onset   Hypertension Mother  Diabetes Mother    Hyperlipidemia Mother    Diabetes Brother    Miscarriages / Stillbirths Maternal Aunt     Social History: Social History   Tobacco Use  Smoking Status Never   Smokeless Tobacco Never   Social History   Substance and Sexual Activity  Alcohol Use Never   Social History   Substance and Sexual Activity  Drug Use Never    Allergies: No Known Allergies  Medications: Current Outpatient Medications  Medication Sig Dispense Refill   amLODipine  (NORVASC ) 10 MG tablet TAKE 1 TABLET(10 MG) BY MOUTH DAILY 90 tablet 1   fluticasone  (FLONASE ) 50 MCG/ACT nasal spray Place 1 spray into both nostrils 2 (two) times daily. 16 g 2   Insulin  Pen Needle (B-D ULTRAFINE III SHORT PEN) 31G X 8 MM MISC Use as directed to inject insulin  daily. 100 each 2   montelukast  (SINGULAIR ) 10 MG tablet Take 1 tablet (10 mg total) by mouth at bedtime. 30 tablet 3   olmesartan -hydrochlorothiazide  (BENICAR  HCT) 40-12.5 MG tablet Take 1 tablet by mouth daily. 90 tablet 3   omeprazole (PRILOSEC) 40 MG capsule Take 1 capsule (40 mg total) by mouth daily. 30 capsule 3   rosuvastatin  (CRESTOR ) 5 MG tablet Take 1 tablet (5 mg total) by mouth daily. 90 tablet 1   tirzepatide  (MOUNJARO ) 5 MG/0.5ML Pen Inject 5 mg into the skin once a week. 6 mL 1   No current facility-administered medications for this visit.    Review of Systems: GENERAL: negative for malaise, night sweats HEENT: No changes in hearing or vision, no nose bleeds or other nasal problems. NECK: Negative for lumps, goiter, pain and significant neck swelling RESPIRATORY: Negative for cough, wheezing CARDIOVASCULAR: Negative for chest pain, leg swelling, palpitations, orthopnea GI: SEE HPI MUSCULOSKELETAL: Negative for joint pain or swelling, back pain, and muscle pain. SKIN: Negative for lesions, rash HEMATOLOGY Negative for prolonged bleeding, bruising easily, and swollen nodes. ENDOCRINE: Negative for cold or heat intolerance, polyuria, polydipsia and goiter. NEURO: negative for tremor, gait imbalance, syncope and seizures. The remainder of the review of systems is noncontributory.   Physical Exam: BP (!)  144/76   Pulse 80   Temp (!) 97.2 F (36.2 C)   Ht 5' 5 (1.651 m)   Wt 195 lb 3.2 oz (88.5 kg)   LMP 11/02/2017   BMI 32.48 kg/m  GENERAL: The patient is AO x3, in no acute distress. HEENT: Head is normocephalic and atraumatic. EOMI are intact. Mouth is well hydrated and without lesions. NECK: Supple. No masses LUNGS: Clear to auscultation. No presence of rhonchi/wheezing/rales. Adequate chest expansion HEART: RRR, normal s1 and s2. ABDOMEN: Soft, Epigastric tenderness and lower abdomen tendenerss , no guarding, no peritoneal signs, and nondistended. BS +. No masses. EXTREMITIES: Without any cyanosis, clubbing, rash, lesions or edema. NEUROLOGIC: AOx3, no focal motor deficit. SKIN: no jaundice, no rashes   Imaging/Labs: as above     Latest Ref Rng & Units 04/02/2024    9:52 AM 06/27/2023   11:26 AM 06/27/2023   11:23 AM  CBC  WBC 3.8 - 10.8 Thousand/uL 6.1  7.2  7.3   Hemoglobin 11.7 - 15.5 g/dL 88.2  87.9  87.7   Hematocrit 35.0 - 45.0 % 36.3  36.6  37.2   Platelets 140 - 400 Thousand/uL 258  251  265    Lab Results  Component Value Date   IRON 73 06/14/2023   FERRITIN 232 (H) 06/14/2023    I personally reviewed and  interpreted the available labs, imaging and endoscopic files.  12/2022 1. Normal gallbladder. 2. Diffuse increased echotexture of the liver. This is a nonspecific finding but can be seen in fatty infiltration of the liver.  CT Angio abdomen pelvis 02/2022  No evidence of thoracoabdominal aortic aneurysm or dissection.   No evidence of central pulmonary embolism.   No acute findings in the chest, abdomen, or pelvis. '  Impression and Plan:  Glenda Thompson is a 53 y.o. female with DM, HLD, HTN, obesity  who presents for evaluation of Chronic constipation, abdominal pain and GERD  #Severe constipation   This is likely IBS-C/chronic idiopathic constipation or defecatory disorder  Patient has severe constipation with 1-2 bowel movement per  month  Previously had good response to Linzess  was not covered by insurance, will prescribe Trulance /plecanatide  tied which is also GC-C agonists  Regular bowel purge with GoLytely  4 L followed by MiraLAX  twice daily  Given severity of constipation will refer for pelvic floor therapy  If above does not help may need anal manometry with balloon expulsion test to evaluate for defecatory disorder/dyssynergia   #GERD Patient has reflux worsened with certain trigger food.  This is exacerbated with BMI of 32 and with truncal obesity  1) Avoid coffee, tea, cola beverages, carbonated beverages, spicy foods, greasy foods, foods high in acid content (e.g. tomatoes and citrus fruits), chocolate, and peppermint 2) Avoid drinking alcoholic beverages 3) Avoid smoking 4) Eat small meals and keep weight within normal range 5) Avoid recumbent posture for 3 hours post-prandially 6) Elevate head of bed  PPI daily   #BMI : 32       - walking at a brisk pace/biking at moderate intesity 2.5-5 hours per week     - use pedometer/step counter to track activity     - goal to lose 5-10% of initial body weight     - avoid suagry drinks and juices, use zero calorie beverages     - increase water  intake     - eat a low carb diet with plenty of veggies and fruit     - Get sufficient sleep 7-8 hrs nightly     - maitain active lifestyle     - avoid alcohol     - recommend 2-3 cups Coffee daily     - Counsel on lowering cholesterol by having a diet rich in vegetables,          protein (avoid red meats) and good fats(fish, salmon   #Hypertension The patient was found to have elevated blood pressure when vital signs were checked in the office. The blood pressure was rechecked by the nursing staff and it was found be persistently elevated >140/90 mmHg. I personally advised to the patient to follow up closely with PCP for hypertension control.   All questions were answered.      Sonna Lipsky Faizan Gwin Eagon,  MD Gastroenterology and Hepatology [facility]   This chart has been completed using [facility] Dictation software, and while attempts have been made to ensure accuracy , certain words and phrases may not be transcribed as intended

## 2024-09-02 NOTE — Addendum Note (Signed)
 Addended by: Vickey Ewbank on: 09/02/2024 04:31 PM   Modules accepted: Orders

## 2024-09-02 NOTE — Patient Instructions (Signed)
 It was very nice to meet you today, as dicussed with will plan for the following :  1) Nulytely  4 liters  2) Ensure adequate fluid intake: Aim for 8 glasses of water  daily. Follow a high fiber diet: Include foods such as dates, prunes, pears, and kiwi. Take Miralax  twice a day for the first week, then reduce to once daily thereafter. Use Metamucil twice a day.  3) Trulance  daily   4) Pelvic floor therapy

## 2024-09-02 NOTE — Telephone Encounter (Signed)
 Medication Samples have been provided to the patient.  Drug name: Linzess        Strength: 290 mcg        Qty: 3 boxes   LOT: [id number]  Exp.Date: 10/26  Dosing instructions: take one by mouth daily  The patient has been instructed regarding the correct time, dose, and frequency of taking this medication, including desired effects and most common side effects.   Glenys Bruns 4:31 PM 09/02/2024

## 2024-09-11 ENCOUNTER — Other Ambulatory Visit: Payer: Self-pay | Admitting: Family Medicine

## 2024-09-23 ENCOUNTER — Encounter: Payer: Self-pay | Admitting: Family Medicine

## 2024-09-24 ENCOUNTER — Other Ambulatory Visit: Payer: Self-pay | Admitting: Family Medicine

## 2024-09-24 ENCOUNTER — Encounter: Payer: Self-pay | Admitting: Family Medicine

## 2024-09-24 MED ORDER — SPIRONOLACTONE 25 MG PO TABS: 25.0000 mg | ORAL_TABLET | Freq: Every day | ORAL | 2 refills | Status: DC

## 2024-09-24 NOTE — Progress Notes (Signed)
 Spironolactone  25 mg daily is prescribed in place of olmesartan /hydrochlorothiazide  will get appt in 4 weeks to re eval blood pressure, tele attempt unsuccessful, will send a pt message

## 2024-09-24 NOTE — Telephone Encounter (Signed)
 Pt aware.

## 2024-10-08 NOTE — Telephone Encounter (Signed)
 Patient dropped off fmla forms again. Needs more hours, because coming 2 times in one month. Need more time. Any questions contact patient at [phone].  Mirror old forms but with more time  Noted Copied Sleeved Original placed in provider box Copy placed front desk folder

## 2024-10-09 NOTE — Telephone Encounter (Signed)
 Obtained

## 2024-10-11 NOTE — Telephone Encounter (Signed)
 Completed. Faxed. Copy placed up front

## 2024-10-21 NOTE — Telephone Encounter (Unsigned)
 Copied from CRM #[id number]. Topic: General - Other >> Oct 18, 2024  2:57 PM Hadassah PARAS wrote: Reason for CRM: Pt is calling to fu on FMLA paperwork. Pt is wanting to know if it is ready for pick up. Please advise on #[phone]

## 2024-10-28 ENCOUNTER — Other Ambulatory Visit: Payer: Self-pay | Admitting: Family Medicine

## 2024-10-28 ENCOUNTER — Telehealth: Payer: Self-pay

## 2024-10-28 MED ORDER — TIRZEPATIDE 5 MG/0.5ML ~~LOC~~ SOAJ: 5.0000 mg | SUBCUTANEOUS | 0 refills | Status: AC

## 2024-10-28 NOTE — Progress Notes (Signed)
 Refill of mounjaro  5 mg  weekly x 1 month sent

## 2024-10-28 NOTE — Telephone Encounter (Signed)
 Pt needing refill of Mounjaro . Please refill if appropriate

## 2024-10-30 ENCOUNTER — Telehealth: Payer: Self-pay

## 2024-10-30 NOTE — Telephone Encounter (Signed)
 Copied from CRM [id number]. Topic: Clinical - Medication Question >> Oct 30, 2024  2:26 PM Sophia H wrote: Reason for CRM: [facility] pharmacy is calling on behalf of the patient, requesting RX for 90 day supply of tirzepatide  (MOUNJARO ) 5 MG/0.5ML Pen.    [facility] [place] [address]

## 2024-10-30 NOTE — Telephone Encounter (Signed)
 Will discuss at visit, compounding pharmacy is a question mark

## 2024-11-01 ENCOUNTER — Encounter: Payer: Self-pay | Admitting: Family Medicine

## 2024-11-01 ENCOUNTER — Ambulatory Visit: Admitting: Family Medicine

## 2024-11-01 VITALS — BP 146/84 | HR 72 | Resp 16 | Ht 65.0 in | Wt 192.0 lb

## 2024-11-01 DIAGNOSIS — I10 Essential (primary) hypertension: Secondary | ICD-10-CM | POA: Diagnosis not present

## 2024-11-01 DIAGNOSIS — Z0001 Encounter for general adult medical examination with abnormal findings: Secondary | ICD-10-CM | POA: Diagnosis not present

## 2024-11-01 DIAGNOSIS — E1169 Type 2 diabetes mellitus with other specified complication: Secondary | ICD-10-CM

## 2024-11-01 DIAGNOSIS — E559 Vitamin D deficiency, unspecified: Secondary | ICD-10-CM

## 2024-11-01 DIAGNOSIS — E782 Mixed hyperlipidemia: Secondary | ICD-10-CM | POA: Diagnosis not present

## 2024-11-01 DIAGNOSIS — Z23 Encounter for immunization: Secondary | ICD-10-CM | POA: Diagnosis not present

## 2024-11-01 MED ORDER — SPIRONOLACTONE 50 MG PO TABS: 50.0000 mg | ORAL_TABLET | Freq: Every day | ORAL | 3 refills | Status: AC

## 2024-11-01 NOTE — Progress Notes (Unsigned)
" ° ° °  Glenda Thompson     MRN: [id number]      DOB: [birth year]  Chief Complaint  Patient presents with   Hypertension    4 week follow up     HPI: Patient is in for annual physical exam. No other health concerns are expressed or addressed at the visit. Recent labs,  are reviewed. Immunization is reviewed , and  updated if needed.   PE: Pleasant  female, alert and oriented x 3, in no cardio-pulmonary distress. Afebrile. HEENT No facial trauma or asymetry. Sinuses non tender.  Extra occullar muscles intact.. External ears normal, . Neck: supple, no adenopathy,JVD or thyromegaly.No bruits.  Chest: Clear to ascultation bilaterally.No crackles or wheezes. Non tender to palpation  Breast: No asymetry,no masses or lumps. No tenderness. No nipple discharge or inversion. No axillary or supraclavicular adenopathy  Cardiovascular system; Heart sounds normal,  S1 and  S2 ,no S3.  No murmur, or thrill. Apical beat not displaced Peripheral pulses normal.  Abdomen: Soft, non tender, no organomegaly or masses. No bruits. Bowel sounds normal. No guarding, tenderness or rebound.   GU: External genitalia normal female genitalia , normal female distribution of hair. No lesions. Urethral meatus normal in size, no  Prolapse, no lesions visibly  Present. Bladder non tender. Vagina pink and moist , with no visible lesions , discharge present . Adequate pelvic support no  cystocele or rectocele noted Cervix pink and appears healthy, no lesions or ulcerations noted, no discharge noted from os Uterus normal size, no adnexal masses, no cervical motion or adnexal tenderness.   Musculoskeletal exam: Full ROM of spine, hips , shoulders and knees. No deformity ,swelling or crepitus noted. No muscle wasting or atrophy.   Neurologic: Cranial nerves 2 to 12 intact. Power, tone ,sensation and reflexes normal throughout. No disturbance in gait. No tremor.  Skin: Intact, no ulceration,  erythema , scaling or rash noted. Pigmentation normal throughout  Psych; Normal mood and affect. Judgement and concentration normal   Assessment & Plan:  No problem-specific Assessment & Plan notes found for this encounter.  "

## 2024-11-01 NOTE — Patient Instructions (Addendum)
 F/u early March , re evaluate blood pressure  Cancel Jan 21 visit  Work excuse for today to start work by 10am today  Hep B and flu vaccines today  Please reschedule eye exam  Foot exam today , wear well fitting shoes due to corn on right 5th toe and use cushion to protect also Labs already ordered today at [facility]  Replens is recommended for dryness and is OTC  New higher dose of spironolactone  is 50 mg once daily Continue amlodipine  10 mg one daily  It is important that you exercise regularly at least 30 minutes 5 times a week. If you develop chest pain, have severe difficulty breathing, or feel very tired, stop exercising immediately and seek medical attention   Thanks for choosing [facility], we consider it a privelige to serve you.

## 2024-11-02 ENCOUNTER — Encounter: Payer: Self-pay | Admitting: Family Medicine

## 2024-11-02 ENCOUNTER — Ambulatory Visit: Payer: Self-pay | Admitting: Family Medicine

## 2024-11-02 DIAGNOSIS — Z0001 Encounter for general adult medical examination with abnormal findings: Secondary | ICD-10-CM | POA: Insufficient documentation

## 2024-11-02 LAB — COMPREHENSIVE METABOLIC PANEL WITH GFR
AG Ratio: 2.1 (calc) (ref 1.0–2.5)
ALT: 12 U/L (ref 6–29)
AST: 16 U/L (ref 10–35)
Albumin: 4.4 g/dL (ref 3.6–5.1)
Alkaline phosphatase (APISO): 64 U/L (ref 37–153)
BUN: 20 mg/dL (ref 7–25)
CO2: 30 mmol/L (ref 20–32)
Calcium: 9.3 mg/dL (ref 8.6–10.4)
Chloride: 104 mmol/L (ref 98–110)
Creat: 0.8 mg/dL (ref 0.50–1.03)
Globulin: 2.1 g/dL (ref 1.9–3.7)
Glucose, Bld: 88 mg/dL (ref 65–99)
Potassium: 4.5 mmol/L (ref 3.5–5.3)
Sodium: 139 mmol/L (ref 135–146)
Total Bilirubin: 0.5 mg/dL (ref 0.2–1.2)
Total Protein: 6.5 g/dL (ref 6.1–8.1)
eGFR: 88 mL/min/1.73m2

## 2024-11-02 LAB — HEMOGLOBIN A1C
Hgb A1c MFr Bld: 5.7 % — ABNORMAL HIGH
Mean Plasma Glucose: 117 mg/dL
eAG (mmol/L): 6.5 mmol/L

## 2024-11-02 LAB — LIPID PANEL
Cholesterol: 145 mg/dL
HDL: 75 mg/dL
LDL Cholesterol (Calc): 57 mg/dL
Non-HDL Cholesterol (Calc): 70 mg/dL
Total CHOL/HDL Ratio: 1.9 (calc)
Triglycerides: 51 mg/dL

## 2024-11-02 LAB — TSH: TSH: 0.97 m[IU]/L

## 2024-11-02 LAB — VITAMIN D 25 HYDROXY (VIT D DEFICIENCY, FRACTURES): Vit D, 25-Hydroxy: 34 ng/mL (ref 30–100)

## 2024-11-02 NOTE — Assessment & Plan Note (Signed)
 Diabetes associated with hypertension, hyperlipidemia, and obesity  Glenda Thompson is reminded of the importance of commitment to daily physical activity for 30 minutes or more, as able and the need to limit carbohydrate intake to 30 to 60 grams per meal to help with blood sugar control.   The need to take medication as prescribed, test blood sugar as directed, and to call between visits if there is a concern that blood sugar is uncontrolled is also discussed.   Glenda Thompson is reminded of the importance of daily foot exam, annual eye examination, and good blood sugar, blood pressure and cholesterol control.     Latest Ref Rng & Units 11/01/2024    9:14 AM 04/02/2024    9:52 AM 11/15/2023    9:30 AM 06/27/2023   11:26 AM 06/27/2023   11:23 AM  Diabetic Labs  HbA1c <5.7 %  5.9  5.7  6.0    Microalbumin mg/dL  0.5      Micro/Creat Ratio <30 mg/g creat  3      Chol <200 mg/dL 854  851  791  821    HDL > OR = 50 mg/dL 75  67  62  60    Calc LDL mg/dL (calc) 57  67  869  898    Triglycerides <150 mg/dL 51  56  65  93    Creatinine 0.50 - 1.03 mg/dL 9.19  8.90  9.01  8.93  0.99       11/01/2024    8:16 AM 09/02/2024    4:14 PM 09/02/2024    4:01 PM 08/07/2024    3:58 PM 06/19/2024    4:15 PM 05/15/2024    4:36 PM 03/21/2024    5:01 PM  BP/Weight  Systolic BP 146 146 144 131 127 142 138  Diastolic BP 84 76 76 71 77 79 80  Wt. (Lbs) 192  195.2 192.2 194.04 199.08   BMI 31.95 kg/m2  32.48 kg/m2 31.98 kg/m2 32.29 kg/m2 33.13 kg/m2       09/18/2023    1:24 PM 03/18/2022    4:00 PM  Foot/eye exam completion dates  Eye Exam --       Foot Form Completion  Done     This result is from an external source.

## 2024-11-02 NOTE — Assessment & Plan Note (Signed)
 After obtaining informed consent, the hep B and influenza  vaccines are   administered , with no adverse effect noted at the time of administration.

## 2024-11-02 NOTE — Assessment & Plan Note (Signed)
 Annual exam as documented. . Immunization and cancer screening needs are specifically addressed at this visit.

## 2024-11-02 NOTE — Assessment & Plan Note (Signed)
 Uncontrolled, increase dose spironolactone  DASH diet and commitment to daily physical activity for a minimum of 30 minutes discussed and encouraged, as a part of hypertension management. The importance of attaining a healthy weight is also discussed.     11/01/2024    8:16 AM 09/02/2024    4:14 PM 09/02/2024    4:01 PM 08/07/2024    3:58 PM 06/19/2024    4:15 PM 05/15/2024    4:36 PM 03/21/2024    5:01 PM  BP/Weight  Systolic BP 146 146 144 131 127 142 138  Diastolic BP 84 76 76 71 77 79 80  Wt. (Lbs) 192  195.2 192.2 194.04 199.08   BMI 31.95 kg/m2  32.48 kg/m2 31.98 kg/m2 32.29 kg/m2 33.13 kg/m2

## 2024-11-03 ENCOUNTER — Other Ambulatory Visit: Payer: Self-pay | Admitting: Internal Medicine

## 2024-11-03 DIAGNOSIS — K219 Gastro-esophageal reflux disease without esophagitis: Secondary | ICD-10-CM

## 2024-11-06 NOTE — Telephone Encounter (Signed)
 What did you decide about pharmacy? They are contacting us  again

## 2024-11-06 NOTE — Telephone Encounter (Signed)
 Noted

## 2024-11-07 ENCOUNTER — Telehealth: Payer: Self-pay

## 2024-11-07 ENCOUNTER — Ambulatory Visit: Admitting: Physical Therapy

## 2024-11-07 NOTE — Telephone Encounter (Signed)
 Copied from CRM #[id number]. Topic: Clinical - Medication Question >> Nov 07, 2024  2:36 PM Delon DASEN wrote: Reason for CRM: Delon with [facility] asking for 90 day supply of tirzepatide  (MOUNJARO ) 5 MG/0.5ML for patient- [phone]   fax [phone] or escribe

## 2024-11-07 NOTE — Telephone Encounter (Signed)
 Pt no longer using this pharmacy

## 2024-11-08 ENCOUNTER — Other Ambulatory Visit: Payer: Self-pay | Admitting: Family Medicine

## 2024-11-08 DIAGNOSIS — E782 Mixed hyperlipidemia: Secondary | ICD-10-CM

## 2024-11-13 ENCOUNTER — Ambulatory Visit: Admitting: Family Medicine

## 2024-12-30 ENCOUNTER — Ambulatory Visit: Admitting: Physical Therapy

## 2025-01-03 ENCOUNTER — Ambulatory Visit: Payer: Self-pay | Admitting: Family Medicine
